# Patient Record
Sex: Female | Born: 1955 | Race: White | Hispanic: No | Marital: Married | Smoking: Former smoker
Health system: Southern US, Community
[De-identification: ages and names within clinical notes are randomized; demographics above are authoritative.]

## PROBLEM LIST (undated history)

## (undated) DIAGNOSIS — T7840XA Allergy, unspecified, initial encounter: Secondary | ICD-10-CM

## (undated) DIAGNOSIS — J45909 Unspecified asthma, uncomplicated: Secondary | ICD-10-CM

## (undated) DIAGNOSIS — Z9889 Other specified postprocedural states: Secondary | ICD-10-CM

## (undated) DIAGNOSIS — R112 Nausea with vomiting, unspecified: Secondary | ICD-10-CM

## (undated) DIAGNOSIS — K219 Gastro-esophageal reflux disease without esophagitis: Secondary | ICD-10-CM

## (undated) DIAGNOSIS — M1990 Unspecified osteoarthritis, unspecified site: Secondary | ICD-10-CM

## (undated) DIAGNOSIS — I10 Essential (primary) hypertension: Secondary | ICD-10-CM

## (undated) HISTORY — DX: Unspecified asthma, uncomplicated: J45.909

## (undated) HISTORY — DX: Allergy, unspecified, initial encounter: T78.40XA

---
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
## 1982-01-11 HISTORY — PX: TUBAL LIGATION: SHX77

## 1999-01-12 HISTORY — PX: ABDOMINAL HYSTERECTOMY: SHX81

## 2004-08-20 ENCOUNTER — Ambulatory Visit: Payer: Self-pay

## 2005-10-07 ENCOUNTER — Ambulatory Visit: Payer: Self-pay

## 2006-07-10 ENCOUNTER — Emergency Department: Payer: Self-pay | Admitting: Emergency Medicine

## 2007-03-30 ENCOUNTER — Ambulatory Visit: Payer: Self-pay | Admitting: Gastroenterology

## 2007-05-02 ENCOUNTER — Ambulatory Visit: Payer: Self-pay

## 2007-05-05 ENCOUNTER — Ambulatory Visit: Payer: Self-pay

## 2008-01-15 ENCOUNTER — Ambulatory Visit: Payer: Self-pay | Admitting: Family Medicine

## 2009-03-13 ENCOUNTER — Ambulatory Visit: Payer: Self-pay

## 2010-05-14 ENCOUNTER — Ambulatory Visit: Payer: Self-pay | Admitting: Family Medicine

## 2010-07-12 HISTORY — PX: KNEE SURGERY: SHX244

## 2010-07-31 ENCOUNTER — Ambulatory Visit: Payer: Self-pay | Admitting: Orthopedic Surgery

## 2010-08-06 ENCOUNTER — Ambulatory Visit: Payer: Self-pay | Admitting: Orthopedic Surgery

## 2012-09-07 ENCOUNTER — Ambulatory Visit: Payer: Self-pay | Admitting: Orthopedic Surgery

## 2012-09-25 ENCOUNTER — Ambulatory Visit: Payer: Self-pay | Admitting: Orthopedic Surgery

## 2012-09-25 LAB — CBC
HCT: 42.1 % (ref 35.0–47.0)
HGB: 14.3 g/dL (ref 12.0–16.0)
MCH: 31.3 pg (ref 26.0–34.0)
MCHC: 34 g/dL (ref 32.0–36.0)
RDW: 12.9 % (ref 11.5–14.5)

## 2012-09-25 LAB — APTT: Activated PTT: 24.9 secs (ref 23.6–35.9)

## 2012-09-25 LAB — URINALYSIS, COMPLETE
Bilirubin,UR: NEGATIVE
Blood: NEGATIVE
Glucose,UR: NEGATIVE mg/dL (ref 0–75)
Ketone: NEGATIVE
Leukocyte Esterase: NEGATIVE
Nitrite: NEGATIVE
Protein: NEGATIVE
RBC,UR: NONE SEEN /HPF (ref 0–5)
Squamous Epithelial: <1

## 2012-09-25 LAB — BASIC METABOLIC PANEL
Anion Gap: 4 — ABNORMAL LOW (ref 7–16)
Calcium, Total: 9.1 mg/dL (ref 8.5–10.1)
Creatinine: 0.68 mg/dL (ref 0.60–1.30)
EGFR (African American): >60
EGFR (Non-African Amer.): >60
Osmolality: 277 (ref 275–301)

## 2012-09-25 LAB — PROTIME-INR
INR: 1
Prothrombin Time: 13 secs (ref 11.5–14.7)

## 2012-09-25 LAB — SEDIMENTATION RATE: Erythrocyte Sed Rate: 7 mm/hr (ref 0–30)

## 2012-10-12 ENCOUNTER — Inpatient Hospital Stay: Payer: Self-pay | Admitting: Orthopedic Surgery

## 2012-10-12 HISTORY — PX: TOTAL KNEE ARTHROPLASTY: SHX125

## 2012-10-13 LAB — BASIC METABOLIC PANEL
Anion Gap: 11 (ref 7–16)
Calcium, Total: 8.2 mg/dL — ABNORMAL LOW (ref 8.5–10.1)
Chloride: 105 mmol/L (ref 98–107)
Creatinine: 0.64 mg/dL (ref 0.60–1.30)
EGFR (African American): >60
Glucose: 89 mg/dL (ref 65–99)
Osmolality: 279 (ref 275–301)
Potassium: 3.8 mmol/L (ref 3.5–5.1)

## 2012-10-13 LAB — HEMOGLOBIN: HGB: 12.7 g/dL (ref 12.0–16.0)

## 2012-10-13 LAB — PLATELET COUNT: Platelet: 243 10*3/uL (ref 150–440)

## 2012-10-16 LAB — PATHOLOGY REPORT

## 2013-02-06 ENCOUNTER — Ambulatory Visit: Payer: Self-pay | Admitting: Family Medicine

## 2013-11-20 ENCOUNTER — Ambulatory Visit: Payer: Self-pay | Admitting: Orthopedic Surgery

## 2013-11-20 LAB — BASIC METABOLIC PANEL
ANION GAP: 6 — AB (ref 7–16)
BUN: 13 mg/dL (ref 7–18)
CALCIUM: 8.3 mg/dL — AB (ref 8.5–10.1)
CHLORIDE: 107 mmol/L (ref 98–107)
CO2: 29 mmol/L (ref 21–32)
CREATININE: 0.68 mg/dL (ref 0.60–1.30)
EGFR (African American): >60
EGFR (Non-African Amer.): >60
Glucose: 76 mg/dL (ref 65–99)
Osmolality: 282 (ref 275–301)
POTASSIUM: 4.1 mmol/L (ref 3.5–5.1)
Sodium: 142 mmol/L (ref 136–145)

## 2013-11-20 LAB — URINALYSIS, COMPLETE
BILIRUBIN, UR: NEGATIVE
Bacteria: NONE SEEN
Blood: NEGATIVE
Glucose,UR: NEGATIVE mg/dL (ref 0–75)
Ketone: NEGATIVE
Nitrite: NEGATIVE
PROTEIN: NEGATIVE
Ph: 6 (ref 4.5–8.0)
SPECIFIC GRAVITY: 1.015 (ref 1.003–1.030)
Squamous Epithelial: 2
WBC UR: 2 /HPF (ref 0–5)

## 2013-11-20 LAB — APTT: ACTIVATED PTT: 23.9 s (ref 23.6–35.9)

## 2013-11-20 LAB — CBC
HCT: 39 % (ref 35.0–47.0)
HGB: 13.3 g/dL (ref 12.0–16.0)
MCH: 31.9 pg (ref 26.0–34.0)
MCHC: 34.1 g/dL (ref 32.0–36.0)
MCV: 93 fL (ref 80–100)
Platelet: 280 10*3/uL (ref 150–440)
RBC: 4.18 10*6/uL (ref 3.80–5.20)
RDW: 13.2 % (ref 11.5–14.5)
WBC: 6.9 10*3/uL (ref 3.6–11.0)

## 2013-11-20 LAB — PROTIME-INR
INR: 1
PROTHROMBIN TIME: 13.3 s (ref 11.5–14.7)

## 2013-11-20 LAB — SEDIMENTATION RATE: Erythrocyte Sed Rate: 9 mm/hr (ref 0–30)

## 2013-11-20 LAB — MRSA PCR SCREENING

## 2013-11-21 LAB — URINE CULTURE

## 2013-11-27 ENCOUNTER — Inpatient Hospital Stay: Payer: Self-pay | Admitting: Orthopedic Surgery

## 2013-11-28 LAB — BASIC METABOLIC PANEL
ANION GAP: 3 — AB (ref 7–16)
BUN: 9 mg/dL (ref 7–18)
CALCIUM: 8 mg/dL — AB (ref 8.5–10.1)
CO2: 30 mmol/L (ref 21–32)
CREATININE: 0.73 mg/dL (ref 0.60–1.30)
Chloride: 104 mmol/L (ref 98–107)
EGFR (African American): >60
EGFR (Non-African Amer.): >60
GLUCOSE: 112 mg/dL — AB (ref 65–99)
Osmolality: 273 (ref 275–301)
Potassium: 3.9 mmol/L (ref 3.5–5.1)
Sodium: 137 mmol/L (ref 136–145)

## 2013-11-28 LAB — PLATELET COUNT: Platelet: 235 10*3/uL (ref 150–440)

## 2013-11-28 LAB — HEMOGLOBIN: HGB: 12.4 g/dL (ref 12.0–16.0)

## 2014-05-03 NOTE — Discharge Summary (Signed)
PATIENT NAME:  Leslie Koch, Leslie Koch MR#:  [id number] DATE OF BIRTH:  September 17, 1955  DATE OF ADMISSION:  10/12/2012 DATE OF DISCHARGE:  10/15/2012  ADMITTING DIAGNOSIS: Degenerative arthritis, right knee.   DISCHARGE DIAGNOSIS: Degenerative arthritis, right knee.   OPERATION: On 10/12/2012, the patient had a right total knee arthroplasty.   SURGEON: Dr. Hessie Knows.   ANESTHESIA: Spinal.   ESTIMATED BLOOD LOSS: 100 mL.  TOURNIQUET TIME: 68 minutes at 300 mmHg.   DRAINS: None.   IMPLANTS: Medacta GMK sphere 3 femur, 2 tibia with 7 mm insert and 2 patella.   COMPLICATIONS: None.   HISTORY: Leslie Koch is a 59 year old female who failed conservative measures for right knee arthritis. This includes steroid injection, Synvisc injection. She has also had an arthroscopy of her right knee. She has also tried Celebrex. She elects to proceed with elective total knee replacement.   PHYSICAL EXAMINATION: HEENT: Upper full and lower partial dentures.  LUNGS: Clear.  HEART: Regular rate and rhythm. EXTREMITIES: Right knee range of motion 5 to 110. Sensation intact to her foot. Strong DP and PT pulses. Skin intact. Varus deformity is passively correctable. Some swelling in the posterior right knee.   HOSPITAL COURSE: After initial admission on 10/12/2012, she underwent right total knee arthroplasty. On postoperative day one, 10/13/2012, hemoglobin 12.7, platelets 243. She did have an issue with vomiting. Physical therapy was begun on that day and she already ambulated 100 feet. On postoperative day two, 10/14/2012, she continued to progress with therapy with good progress. She ambulated 230 feet with a rolling walker. She also had issues with pain control that day. On postoperative day three, 10/15/2012, she continued to progress with therapy. She was stable and ready to be discharged home, as her pain was under control.  CONDITION AT DISCHARGE: Stable.   DISPOSITION: The patient was sent home with home  health.   DISCHARGE INSTRUCTIONS:  1.  The patient will follow up with [facility] orthopedics in 2 weeks for staple removal.   2.  She will have home health physical therapy and weight bear as tolerated on the right lower extremity.  3.  She will have a regular diet.  4.  TED hose thigh high bilaterally.  5.  Dressing can be changed once daily and on an as needed basis.   DISCHARGE MEDICATIONS: Please see discharge instructions for full list of discharge medications. ____________________________ Leslie Koch M. Leslie Sciara, NP amb:sb D: 10/16/2012 10:32:54 ET T: 10/16/2012 10:48:54 ET JOB#: [id number]  cc: Leslie Koch M. Leslie Sciara, NP, <Dictator> Leslie Kays Jamae Tison FNP ELECTRONICALLY SIGNED 10/17/2012 8:56

## 2014-05-03 NOTE — Op Note (Signed)
PATIENT NAME:  Leslie Koch, Leslie Koch MR#:  [id number] DATE OF BIRTH:  1955-09-08  DATE OF PROCEDURE:  10/12/2012  PREOPERATIVE DIAGNOSIS: Severe right knee osteoarthritis.   POSTOPERATIVE DIAGNOSIS:  Severe right knee osteoarthritis.   PROCEDURE: Right total knee replacement.   ANESTHESIA: Spinal.   SURGEON: Hessie Knows, MD  DESCRIPTION OF PROCEDURE: The patient was brought to the operating room and after adequate anesthesia was obtained, right leg was prepped and draped in the usual sterile fashion with a tourniquet applied to the upper thigh. After prepping and draping in the usual sterile manner, appropriate patient identification and timeout procedure were completed. The leg was exsanguinated with an Esmarch and the tourniquet raised to 300 mmHg. A midline skin incision was made followed by a medial parapatellar arthrotomy. Inspection revealed severe patellofemoral degenerative change with exposed bone on the patella. The medial compartment had eburnated bone over most of the distal aspect of the femur. Lateral compartment had pitting but moderate wear compared to the medial side. The anterior cruciate ligament and fat pad were excised and the proximal tibia was exposed for application of the Medacta tibial cutting block. After checking alignment,  this proximal tibia cut was carried out and the proximal tibia removed along with the posterior cruciate ligament. Next, the femur was addressed with removing cartilage to get the block at the appropriate level. Distal femoral cut carried out followed by cutting block for the size 3. Anterior, posterior and chamfer cuts were made with no notching. Trial components were placed at this time and it was a 17 mm insert gave good stability throughout a range of motion and was subsequently chosen for the final component. Distal femoral drill holes were made in the notch cut was made for the trochlear groove. These trials were then removed and the patellar cut with  patellar cutting guide, drilled and it sized to a size 2. At this point, the tourniquet let down to make sure there was no excessive bleeding or arterial bleeding; there was none present. The joint was then infiltrated with a combination of Exparel  for postoperative analgesia along with a combination of morphine, Toradol and local anesthetic to aid in immediate postoperative analgesia. The tourniquet was raised again and the bony surfaces thoroughly irrigated and dried. The tibial component was cemented into place first with excess cement removed, followed by the tibial insert with set screw to lock it in place. The femoral component was then impacted into place and again excess cement removed. The knee was held in extension as the patellar button was clamped into place. After all cement had set, the excess cement was removed and the knee again thoroughly irrigated. The tourniquet was let down and the arthrotomy closed with a heavy quill suture with excellent patellofemoral tracking, 2-0 quill subcutaneously and skin staples. Xeroform, 4 x 4's, ABD, Webril, Polar Care and Ace wrap were then applied. The patient was sent to the recovery room in stable condition.   ESTIMATED BLOOD LOSS: 914 mL   COMPLICATIONS: None.   SPECIMEN: Cut ends of bone.   TOURNIQUET TIME: 68 minutes at 300 mmHg.   IMPLANTS: Medacta GMK sphere 3 femur, 2 tibia, 17 mm flex insert, and a size 2 patella.     ____________________________ Laurene Footman, MD mjm:cc D: 10/12/2012 17:54:04 ET T: 10/12/2012 21:16:28 ET JOB#: [id number]  cc: Laurene Footman, MD, <Dictator> Laurene Footman MD ELECTRONICALLY SIGNED 10/12/2012 22:38

## 2014-05-04 NOTE — Op Note (Signed)
PATIENT NAME:  Leslie Koch, Leslie Koch MR#:  [id number] DATE OF BIRTH:  01-07-1956  DATE OF PROCEDURE:  11/27/2013  PREOPERATIVE DIAGNOSIS: Loose right tibial component, total knee.   POSTOPERATIVE DIAGNOSIS:  Loose right tibial component, total knee.  PROCEDURE: Right total knee revision, revision of tibial components.   ANESTHESIA: Spinal.   SURGEON: Hessie Knows, M.D.   ASSISTANT:  Rachelle Hora, [name].   DESCRIPTION OF PROCEDURE: The patient was brought to the operating room and after adequate anesthesia was obtained, the right leg was prepped and draped in the usual sterile fashion. After patient identification and timeout procedures were completed, the prior incision was opened using a direct anterior incision followed by a medial parapatellar arthrotomy. Inspection revealed moderate synovitis. Obvious subsidence of the medial side of the tibial component.   After that initial exposure was obtained and a partial synovectomy created to allow for adequate exposure, the prior polyethylene insert was removed and the tibia was brought forward. There was no difficulty separating the tibia from the metal insert from the cement as it had separated. The tibia was removed without a great deal of difficulty. Cement was then removed from the top of the tibia, exposing the canal. Sequential hand-reaming was carried out until there was appropriate sizing. A 12 hand reamer, gave good fill of the canal and the Medacta revision cutting guide was applied with resection carried out to remove the prior cement and minimal bone resection. Using the size 2 as a trial, this was pinned in place and a 5 mm offset was determined to be required.   Next, the proximal reaming was carried out followed by the keel punch and then assembly of the trial with 10 mm augment medially and laterally. With these augments, it appeared there was good stability with a 13 or 14 mm insert. The trial components were removed and the bony surfaces  thoroughly irrigated and dried.  Tourniquet was let down and hemostasis achieved with electrocautery excising significant synovitis in the suprapatellar pouch along with the gutters. The knee was then infiltrated with Exparel diluted with saline and the tourniquet raised again. Again, the bony surfaces thoroughly irrigated and dried and the tibia brought forward. Cement was mixed and after having set the tibial component with the appropriate 5 mm offset and appropriate degrees, this was inserted down the canal and impacted into place and a trial 13 mm insert placed as the knee was held in extension.  After the cement had set, and a 14 mm trial fit better. The initial 13 mm insert did not lock posteriorly. The 14 mm insert gave good stability through range of motion, there was range of motion from 0 to approximately 115 degrees with good stability. At 115 degrees, there appeared to be a little bit of liftoff anteriorly. Patella tracked well.   The arthrotomy was repaired using a heavy Ethibond suture around the patella to reinforce followed by heavy quill, 2-0 quill subcutaneously, skin staples, Xeroform, 4 x 4's, ABD, Webril and Ace wrap along with a Polar Care device, 1 Hemovac drain was inserted to try to prevent postop hematoma. The patient was sent to the recovery room in stable condition.   ESTIMATED BLOOD LOSS: 300 mL.   COMPLICATIONS: None.    SPECIMEN: None.   TOURNIQUET TIME: 41 minutes initially, an additional 54 minutes later. Total tourniquet time then 95 minutes.   IMPLANTS: Medacta GMK revision stem tibial augment 10 mm size two x2, a 2 right 14 mm flex polyethylene insert,  a 5 mm offset with a 12 x 105 mm stem and a 2 right fixed tibial tray from the [place] revision set.    ____________________________ Laurene Footman, MD mjm:kl D: 11/27/2013 10:46:00 ET T: 11/27/2013 13:31:12 ET JOB#: [id number]  cc: Laurene Footman, MD, <Dictator> Laurene Footman MD ELECTRONICALLY SIGNED 11/27/2013  19:04

## 2014-05-04 NOTE — Discharge Summary (Signed)
PATIENT NAME:  Leslie Koch, BISCH MR#:  [id number] DATE OF BIRTH:  27-Apr-1955  DATE OF ADMISSION:  11/27/2013  DATE OF DISCHARGE: 11/30/2013   ADMITTING DIAGNOSIS: Loose right tibial component in the total knee.   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   OPERATION: On 11/27/2013, the patient had a right total knee revision with revision of tibial components.   ANESTHESIA: Spinal.   SURGEON: Laurene Footman, MD.  ASSISTANT: Ronney Asters, [name].  ESTIMATED BLOOD LOSS: 300 mL.  IMPLANTS USED:  Medacta GMK revision stemmed tibial augment 10 mm size 2 x 2, a 2 right 14 mm flex polyethylene insert, a 5 mm offset with 12 x 105 mm stem and a 2 right fixed tibial tray from the [place] revision set.    The patient was stabilized, brought to the recovery room and then brought down to the orthopedic floor.   HISTORY AND PHYSICAL HISTORY: The patient is a 59 year old female who presents for persistent pain involving her right total knee replacement. The patient had the original procedure done in November of 2014. The patient continues to have difficulty with activities of daily living and with physical therapy showing no improvement.   PHYSICAL EXAMINATION: GENERAL: Alert female with some discomfort with transfers and ambulation.  CARDIAC: Regular rate and rhythm.  LUNGS: Clear to auscultation.  MUSCULOSKELETAL: In regard to the right lower extremity, the patient has an antalgic gait with mild effusion. The patient is tender along the medial joint line and proximal tibia. The patient has normal strength with good stability.   HOSPITAL COURSE: After initial admission on 11/27/2013, the patient was brought to the orthopedic floor. On postoperative day one, the patient's hemoglobin was 12.4 and remained stable there. The patient worked with physical therapy, initially bed to chair and progressed up to ambulating around the nurse's station including doing stairs. The patient  was ready to go home with home health physical therapy on 11/30/2013.   CONDITION AT DISCHARGE: Stable.   DISPOSITION: The patient was sent home with home health physical therapy.   DISCHARGE INSTRUCTIONS: The patient will do weight bear as tolerated on the affected leg. The patient will elevate her foot with 1 to 2 pillows. The patient will use thigh-high TED hose removed in the morning and kept on at night. The patient will elevate her heels off the bed and use incentive spirometer. The patient's diet is regular. The patient will use Polar Care to decrease swelling and try not and get her dressing dirty or wet. The patient will call the clinic if there is any bright red bleeding, calf pain, or bowel or bladder difficulty. The patient will do home health physical therapy working on gait training and range of motion activities.   DISCHARGE MEDICATIONS: Resume home medications and then to add oxycodone 15  mg 1 tablet b.i.d. for 14 days and then oxycodone 5 mg 1 tablet every 4 hours as needed for severe pain and Lovenox 40 mg subcutaneous once a day for 14 days and then discontinue and start on aspirin 81 mg once a day.    ____________________________ Lenna Sciara. Reche Dixon, [name] jtm:DT D: 11/30/2013 05:51:00 ET T: 11/30/2013 13:03:18 ET JOB#: [id number]  cc: J. Reche Dixon, [name], <Dictator> J Darris Sta[facility] PA ELECTRONICALLY SIGNED 12/02/2013 6:17

## 2014-08-05 ENCOUNTER — Other Ambulatory Visit: Payer: Self-pay | Admitting: Family Medicine

## 2014-08-05 MED ORDER — ESTROGENS CONJUGATED 0.45 MG PO TABS: 0.4500 mg | ORAL_TABLET | Freq: Every day | ORAL | Status: DC

## 2014-08-06 ENCOUNTER — Other Ambulatory Visit: Payer: Self-pay

## 2014-08-06 DIAGNOSIS — E8940 Asymptomatic postprocedural ovarian failure: Secondary | ICD-10-CM | POA: Insufficient documentation

## 2014-08-06 DIAGNOSIS — K219 Gastro-esophageal reflux disease without esophagitis: Secondary | ICD-10-CM | POA: Insufficient documentation

## 2014-08-06 DIAGNOSIS — Z90710 Acquired absence of both cervix and uterus: Secondary | ICD-10-CM | POA: Insufficient documentation

## 2014-08-06 DIAGNOSIS — F329 Major depressive disorder, single episode, unspecified: Secondary | ICD-10-CM | POA: Insufficient documentation

## 2014-08-06 DIAGNOSIS — I10 Essential (primary) hypertension: Secondary | ICD-10-CM | POA: Insufficient documentation

## 2014-08-06 DIAGNOSIS — F419 Anxiety disorder, unspecified: Secondary | ICD-10-CM | POA: Insufficient documentation

## 2014-08-06 DIAGNOSIS — F32A Depression, unspecified: Secondary | ICD-10-CM | POA: Insufficient documentation

## 2014-08-06 DIAGNOSIS — E559 Vitamin D deficiency, unspecified: Secondary | ICD-10-CM | POA: Insufficient documentation

## 2014-08-06 DIAGNOSIS — E7800 Pure hypercholesterolemia, unspecified: Secondary | ICD-10-CM | POA: Insufficient documentation

## 2014-08-06 DIAGNOSIS — G4700 Insomnia, unspecified: Secondary | ICD-10-CM | POA: Insufficient documentation

## 2014-08-09 ENCOUNTER — Encounter: Payer: Self-pay | Admitting: Physician Assistant

## 2014-08-09 ENCOUNTER — Ambulatory Visit (INDEPENDENT_AMBULATORY_CARE_PROVIDER_SITE_OTHER): Payer: BLUE CROSS/BLUE SHIELD | Admitting: Physician Assistant

## 2014-08-09 VITALS — BP 112/72 | HR 80 | Temp 98.3°F | Resp 16 | Ht 62.0 in | Wt 214.0 lb

## 2014-08-09 DIAGNOSIS — IMO0002 Reserved for concepts with insufficient information to code with codable children: Secondary | ICD-10-CM | POA: Insufficient documentation

## 2014-08-09 DIAGNOSIS — N8110 Cystocele, unspecified: Secondary | ICD-10-CM

## 2014-08-09 DIAGNOSIS — Z1239 Encounter for other screening for malignant neoplasm of breast: Secondary | ICD-10-CM

## 2014-08-09 DIAGNOSIS — Z0000 Encounter for general adult medical examination without abnormal findings: Secondary | ICD-10-CM

## 2014-08-09 DIAGNOSIS — N951 Menopausal and female climacteric states: Secondary | ICD-10-CM | POA: Diagnosis not present

## 2014-08-09 DIAGNOSIS — N816 Rectocele: Secondary | ICD-10-CM | POA: Diagnosis not present

## 2014-08-09 DIAGNOSIS — Z124 Encounter for screening for malignant neoplasm of cervix: Secondary | ICD-10-CM

## 2014-08-09 MED ORDER — VENLAFAXINE HCL ER 75 MG PO CP24: 75.0000 mg | ORAL_CAPSULE | Freq: Every day | ORAL | Status: DC

## 2014-08-09 NOTE — Progress Notes (Signed)
Patient ID: Leslie Koch, female   DOB: May 25, 1955, 59 y.o.   MRN: [id number]       Patient: Leslie Koch, Female    DOB: 1955/08/11, 59 y.o.   MRN: [id number] Visit Date: 08/09/2014  Today's Provider: Mar Daring, [name]   Chief Complaint  Patient presents with  . Annual Exam  . Hot Flashes   Subjective:    Annual wellness visit Leslie Koch is a 59 y.o. female who presents today for her Subsequent Annual Wellness Visit. She feels well. She reports exercising occasionally . She reports she is sleeping well.  Her last Pap smear was in March 2011 and was normal and negative HPV. She has never had an abnormal Pap smear. She is status post partial hysterectomy for prolapsed uterus.  She denies any family history of cervical or ovarian cancer. Her last mammogram was in January 2015. It is reported normal. She has never had an abnormal mammogram. There is positive family history of breast cancer in her paternal grandmother.  Her last colonoscopy was March 2009. She is to have repeat colonoscopy in 10 years which would be 2019. There is positive family history of colon cancer in her maternal great aunt.  Her only complaint today is of increasing hot flashes. The hot flashes are increasing in frequency, duration, and intensity. She is interested in other treatment.  -----------------------------------------------------------   Review of Systems  Constitutional: Negative.   HENT: Negative.   Eyes: Negative.   Respiratory: Negative.   Cardiovascular: Negative.   Gastrointestinal: Negative.   Endocrine: Positive for heat intolerance.  Genitourinary: Negative.   Musculoskeletal: Negative.   Skin: Negative.   Allergic/Immunologic: Negative.   Neurological: Negative.   Hematological: Negative.   Psychiatric/Behavioral: Negative.     History   Social History  . Marital Status: Married    Spouse Name: N/A  . Number of Children: N/A  . Years of Education: N/A   Occupational  History  . Not on file.   Social History Main Topics  . Smoking status: Former Smoker -- 2.00 packs/day for 30 years    Types: Cigarettes  . Smokeless tobacco: Never Used     Comment: quit in 1990's  . Alcohol Use: 0.0 oz/week    0 Standard drinks or equivalent per week     Comment: occasionally 2-3 drinks once or twice a month  . Drug Use: No  . Sexual Activity: Not on file   Other Topics Concern  . Not on file   Social History Narrative    Patient Active Problem List   Diagnosis Date Noted  . Anxiety 08/06/2014  . Clinical depression 08/06/2014  . Acid reflux 08/06/2014  . Hypercholesteremia 08/06/2014  . Benign essential HTN 08/06/2014  . Cannot sleep 08/06/2014  . Post hysterectomy menopause 08/06/2014  . Avitaminosis D 08/06/2014    Past Surgical History  Procedure Laterality Date  . Tubal ligation  1984  . Abdominal hysterectomy  01/1999  . Total knee arthroplasty Right 10/12/2012  . Cyst excision  1974  . Knee surgery Right 07/2010    Her family history includes Breast cancer in her paternal grandmother; Coronary artery disease in her maternal grandfather and mother; Diabetes in her mother; Hypertension in her mother; Lung cancer in her father; Stroke in her maternal grandmother.    Previous Medications   ASPIRIN 81 MG TABLET    Take 1 tablet by mouth daily.   BUPROPION (WELLBUTRIN XL) 150 MG 24 HR TABLET  Take 1 tablet by mouth daily.   CALCIUM CARBONATE-VITAMIN D (OYSTER SHELL CALCIUM/D PO)    Take 1 tablet by mouth 2 (two) times daily.   CELECOXIB (CELEBREX) 200 MG CAPSULE    Take 1 capsule by mouth daily.   CETIRIZINE (ZYRTEC ALLERGY) 10 MG TABLET    Take 1 tablet by mouth daily.   CHOLECALCIFEROL 1000 UNITS CAPSULE    Take 1 capsule by mouth 2 (two) times daily.   ESTROGENS, CONJUGATED, (PREMARIN) 0.45 MG TABLET    Take 1 tablet (0.45 mg total) by mouth daily. Take daily for 21 days then do not take for 7 days.   GLUCOSAMINE-CHONDROIT-VIT C-MN  (GLUCOSAMINE CHONDR 1500 COMPLX) CAPS    Take 1 capsule by mouth 2 (two) times daily.   LISINOPRIL-HYDROCHLOROTHIAZIDE (PRINZIDE,ZESTORETIC) 20-12.5 MG PER TABLET    Take 1 tablet by mouth daily.   MULTIPLE VITAMIN PO    Take 1 tablet by mouth daily.   OMEGA-3 FATTY ACIDS PO    Take 2 capsules by mouth 2 (two) times daily.   OMEPRAZOLE 20 MG TBEC    Take 1 tablet by mouth 2 (two) times daily.   SERTRALINE (ZOLOFT) 50 MG TABLET    Take 1 tablet by mouth daily.   SIMVASTATIN (ZOCOR) 40 MG TABLET    Take 1 tablet by mouth at bedtime.   TRAMADOL (ULTRAM) 50 MG TABLET    Take 1 tablet by mouth daily as needed.   VALACYCLOVIR (VALTREX) 1000 MG TABLET    Take 1-2 tablets by mouth every 12 (twelve) hours as needed.    Patient Care Team: Mar Daring, [name] as PCP - General (Physician Assistant)     Objective:   Vitals: BP 112/72 mmHg  Pulse 80  Temp(Src) 98.3 F (36.8 C)  Resp 16  Ht 5\' 2"  (1.575 m)  Wt 214 lb (97.07 kg)  BMI 39.13 kg/m2  Physical Exam  Constitutional: She is oriented to person, place, and time. She appears well-developed and well-nourished. No distress.  HENT:  Head: Normocephalic and atraumatic.  Right Ear: Hearing, tympanic membrane, external ear and ear canal normal.  Left Ear: Hearing, tympanic membrane, external ear and ear canal normal.  Nose: Nose normal.  Mouth/Throat: Uvula is midline, oropharynx is clear and moist and mucous membranes are normal. No oropharyngeal exudate.  Eyes: Conjunctivae and EOM are normal. Pupils are equal, round, and reactive to light. Right eye exhibits no discharge. Left eye exhibits no discharge. No scleral icterus.  Neck: Normal range of motion. Neck supple. No JVD present. Carotid bruit is not present. No tracheal deviation present. No thyromegaly present.  Cardiovascular: Normal rate, regular rhythm, normal heart sounds and intact distal pulses.  Exam reveals no gallop and no friction rub.   No murmur heard. Pulmonary/Chest:  Effort normal and breath sounds normal. No respiratory distress. She has no wheezes. She has no rales. She exhibits no tenderness. Right breast exhibits no inverted nipple, no mass, no nipple discharge, no skin change and no tenderness. Left breast exhibits no inverted nipple, no mass, no nipple discharge, no skin change and no tenderness. Breasts are symmetrical.  Abdominal: Soft. Bowel sounds are normal. She exhibits no distension and no mass. There is no tenderness. There is no rebound and no guarding. Hernia confirmed negative in the right inguinal area and confirmed negative in the left inguinal area.  Genitourinary: Rectum normal and vagina normal. No breast swelling, tenderness, discharge or bleeding. Pelvic exam was performed with patient supine. There  is no rash, tenderness, lesion or injury on the right labia. There is no rash, tenderness, lesion or injury on the left labia. Right adnexum displays no mass, no tenderness and no fullness. Left adnexum displays no mass, no tenderness and no fullness. No erythema, tenderness or bleeding in the vagina. No signs of injury around the vagina. No vaginal discharge found.  Cervix was not visualized secondary to cystocele and rectocele.  She is s/p partial hysterectomy with cervix and ovaries remaining.  Musculoskeletal: Normal range of motion. She exhibits no edema or tenderness.  Lymphadenopathy:    She has no cervical adenopathy.       Right: No inguinal adenopathy present.       Left: No inguinal adenopathy present.  Neurological: She is alert and oriented to person, place, and time. She has normal reflexes. No cranial nerve deficit. Coordination normal.  Skin: Skin is warm and dry. No rash noted. She is not diaphoretic.  Psychiatric: She has a normal mood and affect. Her behavior is normal. Judgment and thought content normal.  Vitals reviewed.      Assessment & Plan:     Annual Wellness Visit  Reviewed patient's Family Medical  History Reviewed and updated list of patient's medical providers Assessed patient's functional ability Established a written schedule for health screening [place] Completed and Reviewed  Exercise Activities and Dietary recommendations Goals    None      Immunization History  Administered Date(s) Administered  . Tdap 02/24/2007    There are no preventive care reminders to display for this patient.    Discussed health benefits of physical activity, and encouraged her to engage in regular exercise appropriate for her age and condition.   1. Annual physical exam We'll check labs, follow-up pending labs. - CBC with Differential - Comprehensive metabolic panel - Lipid panel - TSH  2. Cervical cancer screening Unable to obtain Pap smear today due to not being able to visualize the cervix well. Upon exam she did have a cystocele and rectocele that made it hard to visualize.  3. Breast cancer screening Breast exam normal today. - Mammogram Digital Screening; Future  4. Hot flashes, menopausal Currently on Premarin 0.45mg  tablet. She has noticed increased intensity of her hot flashes over the past 3-4 months. I will check her TSH. Will add venlafaxine for better control of hot flashes. She is to return in one month for follow-up. - CBC with Differential - Comprehensive metabolic panel - TSH - venlafaxine XR (EFFEXOR-XR) 75 MG 24 hr capsule; Take 1 capsule (75 mg total) by mouth daily with breakfast.  Dispense: 30 capsule; Refill: 1  5. Cystocele Seen on physical exam today. Made it difficult to visualize the cervix for Pap smear. She does report having some stress incontinence and urgency. She does not wish to undergo any further her evaluation or treatment at this time unless her symptoms worsen.  6. Rectocele Seen on physical exam today. Made it difficult to visualize the cervix for Pap smear. She does not report any constipation or bowel changes. She  wishes to not undergo any further evaluation or treatment unless her symptoms worsen.    ------------------------------------------------------------------------------------------------------------

## 2014-08-09 NOTE — Patient Instructions (Signed)
Health Maintenance Adopting a healthy lifestyle and getting preventive care can go a long way to promote health and wellness. Talk with your health care provider about what schedule of regular examinations is right for you. This is a good chance for you to check in with your provider about disease prevention and staying healthy. In between checkups, there are plenty of things you can do on your own. Experts have done a lot of research about which lifestyle changes and preventive measures are most likely to keep you healthy. Ask your health care provider for more information. WEIGHT AND DIET  Eat a healthy diet 1. Be sure to include plenty of vegetables, fruits, low-fat dairy products, and lean protein. 2. Do not eat a lot of foods high in solid fats, added sugars, or salt. 3. Get regular exercise. This is one of the most important things you can do for your health. 1. Most adults should exercise for at least 150 minutes each week. The exercise should increase your heart rate and make you sweat (moderate-intensity exercise). 2. Most adults should also do strengthening exercises at least twice a week. This is in addition to the moderate-intensity exercise.  Maintain a healthy weight 1. Body mass index (BMI) is a measurement that can be used to identify possible weight problems. It estimates body fat based on height and weight. Your health care provider can help determine your BMI and help you achieve or maintain a healthy weight. 2. For females 6 years of age and older:  1. A BMI below 18.5 is considered underweight. 2. A BMI of 18.5 to 24.9 is normal. 3. A BMI of 25 to 29.9 is considered overweight. 4. A BMI of 30 and above is considered obese.  Watch levels of cholesterol and blood lipids 1. You should start having your blood tested for lipids and cholesterol at 59 years of age, then have this test every 5 years. 2. You may need to have your cholesterol levels checked more often if: 1. Your  lipid or cholesterol levels are high. 2. You are older than 58 years of age. 3. You are at high risk for heart disease.  CANCER SCREENING   Lung Cancer 1. Lung cancer screening is recommended for adults 7-87 years old who are at high risk for lung cancer because of a history of smoking. 2. A yearly low-dose CT scan of the lungs is recommended for people who: 1. Currently smoke. 2. Have quit within the past 15 years. 3. Have at least a 30-pack-year history of smoking. A pack year is smoking an average of one pack of cigarettes a day for 1 year. 3. Yearly screening should continue until it has been 15 years since you quit. 4. Yearly screening should stop if you develop a health problem that would prevent you from having lung cancer treatment.  Breast Cancer  Practice breast self-awareness. This means understanding how your breasts normally appear and feel.  It also means doing regular breast self-exams. Let your health care provider know about any changes, no matter how small.  If you are in your 20s or 30s, you should have a clinical breast exam (CBE) by a health care provider every 1-3 years as part of a regular health exam.  If you are 30 or older, have a CBE every year. Also consider having a breast X-Madlock (mammogram) every year.  If you have a family history of breast cancer, talk to your health care provider about genetic screening.  If you are  at high risk for breast cancer, talk to your health care provider about having an MRI and a mammogram every year.  Breast cancer gene (BRCA) assessment is recommended for women who have family members with BRCA-related cancers. BRCA-related cancers include:  Breast.  Ovarian.  Tubal.  Peritoneal cancers.  Results of the assessment will determine the need for genetic counseling and BRCA1 and BRCA2 testing. Cervical Cancer Routine pelvic examinations to screen for cervical cancer are no longer recommended for nonpregnant women who  are considered low risk for cancer of the pelvic organs (ovaries, uterus, and vagina) and who do not have symptoms. A pelvic examination may be necessary if you have symptoms including those associated with pelvic infections. Ask your health care provider if a screening pelvic exam is right for you.   The Pap test is the screening test for cervical cancer for women who are considered at risk.  If you had a hysterectomy for a problem that was not cancer or a condition that could lead to cancer, then you no longer need Pap tests.  If you are older than 65 years, and you have had normal Pap tests for the past 10 years, you no longer need to have Pap tests.  If you have had past treatment for cervical cancer or a condition that could lead to cancer, you need Pap tests and screening for cancer for at least 20 years after your treatment.  If you no longer get a Pap test, assess your risk factors if they change (such as having a new sexual partner). This can affect whether you should start being screened again.  Some women have medical problems that increase their chance of getting cervical cancer. If this is the case for you, your health care provider may recommend more frequent screening and Pap tests.  The human papillomavirus (HPV) test is another test that may be used for cervical cancer screening. The HPV test looks for the virus that can cause cell changes in the cervix. The cells collected during the Pap test can be tested for HPV.  The HPV test can be used to screen women 2 years of age and older. Getting tested for HPV can extend the interval between normal Pap tests from three to five years.  An HPV test also should be used to screen women of any age who have unclear Pap test results.  After 58 years of age, women should have HPV testing as often as Pap tests.  Colorectal Cancer  This type of cancer can be detected and often prevented.  Routine colorectal cancer screening usually  begins at 58 years of age and continues through 58 years of age.  Your health care provider may recommend screening at an earlier age if you have risk factors for colon cancer.  Your health care provider may also recommend using home test kits to check for hidden blood in the stool.  A small camera at the end of a tube can be used to examine your colon directly (sigmoidoscopy or colonoscopy). This is done to check for the earliest forms of colorectal cancer.  Routine screening usually begins at age 57.  Direct examination of the colon should be repeated every 5-10 years through 58 years of age. However, you may need to be screened more often if early forms of precancerous polyps or small growths are found. Skin Cancer  Check your skin from head to toe regularly.  Tell your health care provider about any new moles or changes in  moles, especially if there is a change in a mole's shape or color.  Also tell your health care provider if you have a mole that is larger than the size of a pencil eraser.  Always use sunscreen. Apply sunscreen liberally and repeatedly throughout the day.  Protect yourself by wearing long sleeves, pants, a wide-brimmed hat, and sunglasses whenever you are outside. HEART DISEASE, DIABETES, AND HIGH BLOOD PRESSURE   Have your blood pressure checked at least every 1-2 years. High blood pressure causes heart disease and increases the risk of stroke.  If you are between 32 years and 30 years old, ask your health care provider if you should take aspirin to prevent strokes.  Have regular diabetes screenings. This involves taking a blood sample to check your fasting blood sugar level.  If you are at a normal weight and have a low risk for diabetes, have this test once every three years after 58 years of age.  If you are overweight and have a high risk for diabetes, consider being tested at a younger age or more often. PREVENTING INFECTION  Hepatitis B  If you have a  higher risk for hepatitis B, you should be screened for this virus. You are considered at high risk for hepatitis B if:  You were born in a country where hepatitis B is common. Ask your health care provider which countries are considered high risk.  Your parents were born in a high-risk country, and you have not been immunized against hepatitis B (hepatitis B vaccine).  You have HIV or AIDS.  You use needles to inject street drugs.  You live with someone who has hepatitis B.  You have had sex with someone who has hepatitis B.  You get hemodialysis treatment.  You take certain medicines for conditions, including cancer, organ transplantation, and autoimmune conditions. Hepatitis C  Blood testing is recommended for:  Everyone born from [birth year] through 1964.  Anyone with known risk factors for hepatitis C. Sexually transmitted infections (STIs)  You should be screened for sexually transmitted infections (STIs) including gonorrhea and chlamydia if:  You are sexually active and are younger than 58 years of age.  You are older than 58 years of age and your health care provider tells you that you are at risk for this type of infection.  Your sexual activity has changed since you were last screened and you are at an increased risk for chlamydia or gonorrhea. Ask your health care provider if you are at risk.  If you do not have HIV, but are at risk, it may be recommended that you take a prescription medicine daily to prevent HIV infection. This is called pre-exposure prophylaxis (PrEP). You are considered at risk if:  You are sexually active and do not regularly use condoms or know the HIV status of your partner(s).  You take drugs by injection.  You are sexually active with a partner who has HIV. Talk with your health care provider about whether you are at high risk of being infected with HIV. If you choose to begin PrEP, you should first be tested for HIV. You should then be tested  every 3 months for as long as you are taking PrEP.  PREGNANCY   If you are premenopausal and you may become pregnant, ask your health care provider about preconception counseling.  If you may become pregnant, take 400 to 800 micrograms (mcg) of folic acid every day.  If you want to prevent pregnancy, talk to your  health care provider about birth control (contraception). OSTEOPOROSIS AND MENOPAUSE   Osteoporosis is a disease in which the bones lose minerals and strength with aging. This can result in serious bone fractures. Your risk for osteoporosis can be identified using a bone density scan.  If you are 34 years of age or older, or if you are at risk for osteoporosis and fractures, ask your health care provider if you should be screened.  Ask your health care provider whether you should take a calcium or vitamin D supplement to lower your risk for osteoporosis.  Menopause may have certain physical symptoms and risks.  Hormone replacement therapy may reduce some of these symptoms and risks. Talk to your health care provider about whether hormone replacement therapy is right for you.  HOME CARE INSTRUCTIONS   Schedule regular health, dental, and eye exams.  Stay current with your immunizations.   Do not use any tobacco products including cigarettes, chewing tobacco, or electronic cigarettes.  If you are pregnant, do not drink alcohol.  If you are breastfeeding, limit how much and how often you drink alcohol.  Limit alcohol intake to no more than 1 drink per day for nonpregnant women. One drink equals 12 ounces of beer, 5 ounces of wine, or 1 ounces of hard liquor.  Do not use street drugs.  Do not share needles.  Ask your health care provider for help if you need support or information about quitting drugs.  Tell your health care provider if you often feel depressed.  Tell your health care provider if you have ever been abused or do not feel safe at home. Document  Released: 07/13/2010 Document Revised: 05/14/2013 Document Reviewed: 11/29/2012 [place] [facility] Patient Information 2015 [place], [place]. This information is not intended to replace advice given to you by your health care provider. Make sure you discuss any questions you have with your health care provider.     Why follow it? Research shows. . Those who follow the Mediterranean diet have a reduced risk of heart disease  . The diet is associated with a reduced incidence of Parkinson's and Alzheimer's diseases . People following the diet may have longer life expectancies and lower rates of chronic diseases  . The Dietary Guidelines for Americans recommends the Mediterranean diet as an eating plan to promote health and prevent disease  What Is the Mediterranean Diet?  . Healthy eating plan based on typical foods and recipes of Mediterranean-style cooking . The diet is primarily a plant based diet; these foods should make up a majority of meals   Starches - Plant based foods should make up a majority of meals - They are an important sources of vitamins, minerals, energy, antioxidants, and fiber - Choose whole grains, foods high in fiber and minimally processed items  - Typical grain sources include wheat, oats, barley, corn, brown rice, bulgar, farro, millet, polenta, couscous  - Various types of beans include chickpeas, lentils, fava beans, black beans, white beans   Fruits  Veggies - Large quantities of antioxidant rich fruits & veggies; 6 or more servings  - Vegetables can be eaten raw or lightly drizzled with oil and cooked  - Vegetables common to the traditional Mediterranean Diet include: artichokes, arugula, beets, broccoli, brussel sprouts, cabbage, carrots, celery, collard greens, cucumbers, eggplant, kale, leeks, lemons, lettuce, mushrooms, okra, onions, peas, peppers, potatoes, pumpkin, radishes, rutabaga, shallots, spinach, sweet potatoes, turnips, zucchini - Fruits common to the Mediterranean  Diet include: apples, apricots, avocados, cherries, clementines, dates, figs, grapefruits,  grapes, melons, nectarines, oranges, peaches, pears, pomegranates, strawberries, tangerines  Fats - Replace butter and margarine with healthy oils, such as olive oil, canola oil, and tahini  - Limit nuts to no more than a handful a day  - Nuts include walnuts, almonds, pecans, pistachios, pine nuts  - Limit or avoid candied, honey roasted or heavily salted nuts - Olives are central to the Mediterranean diet - can be eaten whole or used in a variety of dishes   Meats Protein - Limiting red meat: no more than a few times a month - When eating red meat: choose lean cuts and keep the portion to the size of deck of cards - Eggs: approx. 0 to 4 times a week  - Fish and lean poultry: at least 2 a week  - Healthy protein sources include, chicken, [place], lean beef, lamb - Increase intake of seafood such as tuna, salmon, trout, mackerel, shrimp, scallops - Avoid or limit high fat processed meats such as sausage and bacon  Dairy - Include moderate amounts of low fat dairy products  - Focus on healthy dairy such as fat free yogurt, skim milk, low or reduced fat cheese - Limit dairy products higher in fat such as whole or 2% milk, cheese, ice cream  Alcohol - Moderate amounts of red wine is ok  - No more than 5 oz daily for women (all ages) and men older than age 74  - No more than 10 oz of wine daily for men younger than 44  Other - Limit sweets and other desserts  - Use herbs and spices instead of salt to flavor foods  - Herbs and spices common to the traditional Mediterranean Diet include: basil, bay leaves, chives, cloves, cumin, fennel, garlic, lavender, marjoram, mint, oregano, parsley, pepper, rosemary, sage, savory, sumac, tarragon, thyme   It's not just a diet, it's a lifestyle:  . The Mediterranean diet includes lifestyle factors typical of those in the region  . Foods, drinks and meals are best eaten  with others and savored . Daily physical activity is important for overall good health . This could be strenuous exercise like running and aerobics . This could also be more leisurely activities such as walking, housework, yard-work, or taking the stairs . Moderation is the key; a balanced and healthy diet accommodates most foods and drinks . Consider portion sizes and frequency of consumption of certain foods   Meal Ideas & Options:  . Breakfast:  o Whole wheat toast or whole wheat English muffins with peanut butter & hard boiled egg o Steel cut oats topped with apples & cinnamon and skim milk  o Fresh fruit: banana, strawberries, melon, berries, peaches  o Smoothies: strawberries, bananas, greek yogurt, peanut butter o Low fat greek yogurt with blueberries and granola  o Egg white omelet with spinach and mushrooms o Breakfast couscous: whole wheat couscous, apricots, skim milk, cranberries  . Sandwiches:  o Hummus and grilled vegetables (peppers, zucchini, squash) on whole wheat bread   o Grilled chicken on whole wheat pita with lettuce, tomatoes, cucumbers or tzatziki  o Tuna salad on whole wheat bread: tuna salad made with greek yogurt, olives, red peppers, capers, green onions o Garlic rosemary lamb pita: lamb sauted with garlic, rosemary, salt & pepper; add lettuce, cucumber, greek yogurt to pita - flavor with lemon juice and black pepper  . Seafood:  o Mediterranean grilled salmon, seasoned with garlic, basil, parsley, lemon juice and black pepper o Shrimp, lemon, and spinach  whole-grain pasta salad made with low fat greek yogurt  o Seared scallops with lemon orzo  o Seared tuna steaks seasoned salt, pepper, coriander topped with tomato mixture of olives, tomatoes, olive oil, minced garlic, parsley, green onions and cappers  . Meats:  o Herbed greek chicken salad with kalamata olives, cucumber, feta  o Red bell peppers stuffed with spinach, bulgur, lean ground beef (or lentils) &  topped with feta   o Kebabs: skewers of chicken, tomatoes, onions, zucchini, squash  o [place] burgers: made with red onions, mint, dill, lemon juice, feta cheese topped with roasted red peppers . Vegetarian o Cucumber salad: cucumbers, artichoke hearts, celery, red onion, feta cheese, tossed in olive oil & lemon juice  o Hummus and whole grain pita points with a greek salad (lettuce, tomato, feta, olives, cucumbers, red onion) o Lentil soup with celery, carrots made with vegetable broth, garlic, salt and pepper  o Tabouli salad: parsley, bulgur, mint, scallions, cucumbers, tomato, radishes, lemon juice, olive oil, salt and pepper.      American Heart Association (AHA) Exercise Recommendation  Being physically active is important to prevent heart disease and stroke, the nation's No. 1and No. 5killers. To improve overall cardiovascular health, we suggest at least 150 minutes per week of moderate exercise or 75 minutes per week of vigorous exercise (or a combination of moderate and vigorous activity). Thirty minutes a day, five times a week is an easy goal to remember. You will also experience benefits even if you divide your time into two or three segments of 10 to 15 minutes per day.  For people who would benefit from lowering their blood pressure or cholesterol, we recommend 40 minutes of aerobic exercise of moderate to vigorous intensity three to four times a week to lower the risk for heart attack and stroke.  Physical activity is anything that makes you move your body and burn calories.  This includes things like climbing stairs or playing sports. Aerobic exercises benefit your heart, and include walking, jogging, swimming or biking. Strength and stretching exercises are best for overall stamina and flexibility.  The simplest, positive change you can make to effectively improve your heart health is to start walking. It's enjoyable, free, easy, social and great exercise. A walking program is  flexible and boasts high success rates because people can stick with it. It's easy for walking to become a regular and satisfying part of life.   For Overall Cardiovascular Health:  At least 30 minutes of moderate-intensity aerobic activity at least 5 days per week for a total of 150  OR   At least 25 minutes of vigorous aerobic activity at least 3 days per week for a total of 75 minutes; or a combination of moderate- and vigorous-intensity aerobic activity  AND   Moderate- to high-intensity muscle-strengthening activity at least 2 days per week for additional health benefits.  For Lowering Blood Pressure and Cholesterol  An average 40 minutes of moderate- to vigorous-intensity aerobic activity 3 or 4 times per week  What if I can't make it to the time goal? Something is always better than nothing! And everyone has to start somewhere. Even if you've been sedentary for years, today is the day you can begin to make healthy changes in your life. If you don't think you'll make it for 30 or 40 minutes, set a reachable goal for today. You can work up toward your overall goal by increasing your time as you get stronger. Don't let all-or-nothing thinking  rob you of doing what you can every day.  Source:http://www.heart.org

## 2014-08-12 ENCOUNTER — Telehealth: Payer: Self-pay | Admitting: Physician Assistant

## 2014-08-12 DIAGNOSIS — N951 Menopausal and female climacteric states: Secondary | ICD-10-CM

## 2014-08-12 NOTE — Telephone Encounter (Signed)
Pt stated that the new medication pt started for hot flashes venlafaxine XR (EFFEXOR-XR) 75 MG 24 hr capsule had increased her hot flashes. Pt stated that she had been taking the medication in the morning. Thanks TNP

## 2014-08-13 NOTE — Telephone Encounter (Signed)
How should patient adjust her medication? Leslie Koch not in office today, and patient called yesterday regarding this. Could you please review? Thanks!

## 2014-08-13 NOTE — Telephone Encounter (Signed)
Advise taking the Venlafaxine-XR 75 mg daily for at least a month to get full blood levels and full effect to help control hot flashes. May need to increase dosage at that time if still having significant hot flashes.

## 2014-08-13 NOTE — Telephone Encounter (Signed)
Advised patient as below. Patient reports that she could not continue taking Venlafaxine due to med having the opposite effects on her. Patient reports that her symptoms became 10x worse. Patient is willing to try another alternative (if you have one). Patient reports that if she could take the med the entire month she would, but she could not bear the hot flashes.

## 2014-08-14 DIAGNOSIS — N951 Menopausal and female climacteric states: Secondary | ICD-10-CM | POA: Insufficient documentation

## 2014-08-14 MED ORDER — PAROXETINE MESYLATE 7.5 MG PO CAPS: 1.0000 | ORAL_CAPSULE | Freq: Every day | ORAL | Status: DC

## 2014-08-14 NOTE — Telephone Encounter (Signed)
Will try paroxetine instead.  New Rx sent to [facility].  Once she gets Rx she may discontinue venlafaxine and start paroxetine.  Thanks!

## 2014-08-14 NOTE — Telephone Encounter (Signed)
Left patient a message on cell voicemail advising patient that new RX has been sent to pharmacy and discontinue old RX when she gets the new RX.

## 2014-08-27 ENCOUNTER — Telehealth: Payer: Self-pay | Admitting: Physician Assistant

## 2014-08-27 NOTE — Telephone Encounter (Deleted)
P 

## 2014-09-06 ENCOUNTER — Ambulatory Visit: Payer: BLUE CROSS/BLUE SHIELD | Admitting: Physician Assistant

## 2014-11-01 ENCOUNTER — Other Ambulatory Visit: Payer: Self-pay | Admitting: Family Medicine

## 2014-12-11 ENCOUNTER — Other Ambulatory Visit: Payer: Self-pay | Admitting: Family Medicine

## 2014-12-11 MED ORDER — LISINOPRIL-HYDROCHLOROTHIAZIDE 20-12.5 MG PO TABS: 1.0000 | ORAL_TABLET | Freq: Every day | ORAL | Status: DC

## 2015-01-01 ENCOUNTER — Other Ambulatory Visit: Payer: Self-pay | Admitting: *Deleted

## 2015-01-01 DIAGNOSIS — E7800 Pure hypercholesterolemia, unspecified: Secondary | ICD-10-CM

## 2015-01-01 MED ORDER — SIMVASTATIN 40 MG PO TABS: 40.0000 mg | ORAL_TABLET | Freq: Every day | ORAL | Status: DC

## 2015-01-29 ENCOUNTER — Telehealth: Payer: Self-pay | Admitting: Physician Assistant

## 2015-01-29 MED ORDER — ESTROGENS CONJUGATED 0.45 MG PO TABS: 0.4500 mg | ORAL_TABLET | Freq: Every day | ORAL | Status: DC

## 2015-01-29 NOTE — Telephone Encounter (Signed)
Left message prescription was sent as directed below.  Thanks,  -Joseline

## 2015-01-29 NOTE — Telephone Encounter (Signed)
Pt states the dosage on the Rx PREMARIN 0.45 MG tablet is incorrect.  Pt states she has always taken 1 every day. Pt is requesting a new Rx sent for the correct dosage.   CB#[phone]

## 2015-01-29 NOTE — Telephone Encounter (Signed)
[facility] daily use.

## 2015-02-10 ENCOUNTER — Other Ambulatory Visit: Payer: Self-pay | Admitting: Physician Assistant

## 2015-02-10 MED ORDER — VALACYCLOVIR HCL 1 G PO TABS: 1000.0000 mg | ORAL_TABLET | Freq: Two times a day (BID) | ORAL | Status: DC | PRN

## 2015-02-10 NOTE — Telephone Encounter (Signed)
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 Could you refill for patient?

## 2015-02-10 NOTE — Telephone Encounter (Signed)
Pt needs refill valACYclovir (VALTREX) 1000 MG tablet   [facility] uses [facility]  Her call is [phone]  Thanks TEri

## 2015-02-11 ENCOUNTER — Telehealth: Payer: Self-pay | Admitting: Physician Assistant

## 2015-02-11 MED ORDER — VALACYCLOVIR HCL 1 G PO TABS: 1000.0000 mg | ORAL_TABLET | Freq: Two times a day (BID) | ORAL | Status: DC | PRN

## 2015-02-11 NOTE — Telephone Encounter (Signed)
Pt requested a refill for valACYclovir (VALTREX) 1000 MG tablet and it was approved and sent to [facility]. Pt would like a weeks supply sent to [facility] to last her until the mail order RX gets to her. Thanks TNP

## 2015-02-11 NOTE — Telephone Encounter (Signed)
Please review. Ok to send into local pharmacy?  

## 2015-02-11 NOTE — Telephone Encounter (Signed)
Meds sent to [facility]

## 2015-02-11 NOTE — Telephone Encounter (Signed)
Advised patient as below.  

## 2015-03-27 ENCOUNTER — Telehealth: Payer: Self-pay | Admitting: Physician Assistant

## 2015-03-27 DIAGNOSIS — B379 Candidiasis, unspecified: Secondary | ICD-10-CM

## 2015-03-27 MED ORDER — FLUCONAZOLE 150 MG PO TABS: 150.0000 mg | ORAL_TABLET | Freq: Once | ORAL | Status: DC

## 2015-03-27 NOTE — Telephone Encounter (Signed)
Rx sent to [facility]. Hold simvastatin x 3 days.  She will need to be seen if this doesn't improve symptoms.  Thanks.

## 2015-03-27 NOTE — Telephone Encounter (Signed)
Pt would like an RX for Diflucan b/c she thinks she has a yeast infection and has tried things over the counter and they haven't worked. Pt would like it sent to [facility]. I advised that I wasn't sure if we could send it in without an OV but I would ask. Please advise. Thanks TNP

## 2015-03-27 NOTE — Telephone Encounter (Signed)
Patient advised as directed below. Patient verbalized understanding.  

## 2015-05-19 DIAGNOSIS — M1712 Unilateral primary osteoarthritis, left knee: Secondary | ICD-10-CM | POA: Diagnosis not present

## 2015-06-06 DIAGNOSIS — Z96651 Presence of right artificial knee joint: Secondary | ICD-10-CM | POA: Diagnosis not present

## 2015-06-06 DIAGNOSIS — M25561 Pain in right knee: Secondary | ICD-10-CM | POA: Diagnosis not present

## 2015-06-07 ENCOUNTER — Other Ambulatory Visit: Payer: Self-pay | Admitting: Family Medicine

## 2015-06-07 DIAGNOSIS — I10 Essential (primary) hypertension: Secondary | ICD-10-CM

## 2015-07-23 ENCOUNTER — Telehealth: Payer: Self-pay | Admitting: Emergency Medicine

## 2015-07-23 DIAGNOSIS — B001 Herpesviral vesicular dermatitis: Secondary | ICD-10-CM

## 2015-07-23 MED ORDER — VALACYCLOVIR HCL 1 G PO TABS: 1000.0000 mg | ORAL_TABLET | Freq: Two times a day (BID) | ORAL | Status: DC | PRN

## 2015-07-23 NOTE — Telephone Encounter (Signed)
Pt requesting a refill on valtrex 1000 mg [facility] [place]. She has a fever blister on her lip right now. Please advise.

## 2015-07-23 NOTE — Telephone Encounter (Signed)
Please review. Thanks!  

## 2015-07-23 NOTE — Telephone Encounter (Signed)
Sent to [facility]

## 2015-08-18 NOTE — Telephone Encounter (Signed)
error 

## 2015-10-16 DIAGNOSIS — M1712 Unilateral primary osteoarthritis, left knee: Secondary | ICD-10-CM | POA: Diagnosis not present

## 2015-10-20 ENCOUNTER — Telehealth: Payer: Self-pay

## 2015-10-20 NOTE — Telephone Encounter (Signed)
Called pt to schedule OV for pre-op exam. Pt prefers to contact surgeon to see the optimal time to have this scheduled. Will call back. Renaldo Fiddler, CMA

## 2015-12-18 ENCOUNTER — Ambulatory Visit: Payer: Self-pay | Admitting: Orthopedic Surgery

## 2016-01-06 ENCOUNTER — Other Ambulatory Visit: Payer: Self-pay | Admitting: Physician Assistant

## 2016-01-09 ENCOUNTER — Ambulatory Visit (INDEPENDENT_AMBULATORY_CARE_PROVIDER_SITE_OTHER): Payer: BLUE CROSS/BLUE SHIELD | Admitting: Physician Assistant

## 2016-01-09 ENCOUNTER — Encounter: Payer: Self-pay | Admitting: Physician Assistant

## 2016-01-09 VITALS — BP 128/80 | HR 84 | Temp 98.0°F | Resp 16 | Ht 62.0 in | Wt 216.2 lb

## 2016-01-09 DIAGNOSIS — Z1231 Encounter for screening mammogram for malignant neoplasm of breast: Secondary | ICD-10-CM | POA: Diagnosis not present

## 2016-01-09 DIAGNOSIS — B001 Herpesviral vesicular dermatitis: Secondary | ICD-10-CM

## 2016-01-09 DIAGNOSIS — Z1239 Encounter for other screening for malignant neoplasm of breast: Secondary | ICD-10-CM

## 2016-01-09 DIAGNOSIS — Z01818 Encounter for other preprocedural examination: Secondary | ICD-10-CM

## 2016-01-09 DIAGNOSIS — R252 Cramp and spasm: Secondary | ICD-10-CM

## 2016-01-09 DIAGNOSIS — M1711 Unilateral primary osteoarthritis, right knee: Secondary | ICD-10-CM | POA: Diagnosis not present

## 2016-01-09 DIAGNOSIS — E7800 Pure hypercholesterolemia, unspecified: Secondary | ICD-10-CM | POA: Diagnosis not present

## 2016-01-09 DIAGNOSIS — Z0000 Encounter for general adult medical examination without abnormal findings: Secondary | ICD-10-CM

## 2016-01-09 DIAGNOSIS — I10 Essential (primary) hypertension: Secondary | ICD-10-CM

## 2016-01-09 MED ORDER — VALACYCLOVIR HCL 1 G PO TABS: 1000.0000 mg | ORAL_TABLET | Freq: Two times a day (BID) | ORAL | 3 refills | Status: DC | PRN

## 2016-01-09 NOTE — Progress Notes (Signed)
Patient: Leslie Koch, Female    DOB: 08-Jun-1955, 60 y.o.   MRN: [id number] Visit Date: 01/09/2016  Today's Provider: Mar Daring, [name]   Chief Complaint  Patient presents with  . Annual Exam   Subjective:    Annual physical exam Leslie Koch is a 60 y.o. female who presents today for health maintenance and complete physical. She feels fairly well. She reports exercising none but walks every day at work. She reports she is sleeping fairly well.   Colonoscopy:03/30/2007 Mammogram:02/06/13 BI-RADS 1 S/P Hysterectomy-no previous abnormal pap ----------------------------------------------------------------- She also reports that she is having severe cramps in her left groin area that are waking her up from her sleep. She reports it occurs most often after a long, hard day at work. When it occurs it happens in the medial muscle group from the hip to outside the left knee.   Patient also needs surgical clearance.She is scheduled to have surgery on the right knee: TK-Revision both femoral and tibial components February 11, 2016.  She is also requesting a refill on Valtrex. She notice the fever blister last night. It itches.  Review of Systems  Constitutional: Negative.   HENT: Negative.   Eyes: Negative.   Respiratory: Negative.   Cardiovascular: Negative.   Gastrointestinal: Negative.   Endocrine: Negative.   Genitourinary: Negative.   Musculoskeletal: Positive for arthralgias, back pain, gait problem and myalgias.  Skin: Negative.   Allergic/Immunologic: Negative.   Neurological: Negative for dizziness, weakness, light-headedness, numbness and headaches.  Hematological: Negative.   Psychiatric/Behavioral: Negative.     Social History      She  reports that she has quit smoking. Her smoking use included Cigarettes. She has a 60.00 pack-year smoking history. She has never used smokeless tobacco. She reports that she drinks alcohol. She reports that she does  not use drugs.       Social History   Social History  . Marital status: Married    Spouse name: N/A  . Number of children: N/A  . Years of education: N/A   Social History Main Topics  . Smoking status: Former Smoker    Packs/day: 2.00    Years: 30.00    Types: Cigarettes  . Smokeless tobacco: Never Used     Comment: quit 04/14/1995  . Alcohol use 0.0 oz/week     Comment: occasionally 2-3 drinks once or twice a month  . Drug use: No  . Sexual activity: Not Asked   Other Topics Concern  . None   Social History Narrative  . None    History reviewed. No pertinent past medical history.   Patient Active Problem List   Diagnosis Date Noted  . Hot flashes, menopausal 08/14/2014  . Cystocele 08/09/2014  . Rectocele 08/09/2014  . Anxiety 08/06/2014  . Acid reflux 08/06/2014  . Hypercholesteremia 08/06/2014  . Benign essential HTN 08/06/2014  . Cannot sleep 08/06/2014  . Post hysterectomy menopause 08/06/2014  . Avitaminosis D 08/06/2014    Past Surgical History:  Procedure Laterality Date  . ABDOMINAL HYSTERECTOMY  01/1999  . CYST EXCISION  1974  . KNEE SURGERY Right 07/2010  . 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 TUBAL LIGATION  1984    Family History        Family Status  Relation Status  . Mother Alive  . Father Deceased at age 49  . Maternal Grandmother Deceased  . Maternal Grandfather Deceased  . Paternal Grandmother Deceased  . Paternal  Grandfather Deceased        Her family history includes Breast cancer in her paternal grandmother; Coronary artery disease in her maternal grandfather and mother; Diabetes in her mother; Hypertension in her mother; Lung cancer in her father; Stroke in her maternal grandmother.     Allergies  Allergen Reactions  . Montelukast Sodium Rash     Current Outpatient Prescriptions:  .  aspirin 81 MG tablet, Take 1 tablet by mouth daily., Disp: , Rfl:  .  Calcium Carbonate-Vitamin D (OYSTER SHELL CALCIUM/D PO), Take 1  tablet by mouth 2 (two) times daily., Disp: , Rfl:  .  celecoxib (CELEBREX) 200 MG capsule, Take 1 capsule by mouth daily., Disp: , Rfl:  .  cetirizine (ZYRTEC ALLERGY) 10 MG tablet, Take 1 tablet by mouth daily., Disp: , Rfl:  .  Cholecalciferol 1000 UNITS capsule, Take 1 capsule by mouth 2 (two) times daily., Disp: , Rfl:  .  Glucosamine-Chondroit-Vit C-Mn (GLUCOSAMINE CHONDR 1500 COMPLX) CAPS, Take 1 capsule by mouth 2 (two) times daily., Disp: , Rfl:  .  lisinopril-hydrochlorothiazide (PRINZIDE,ZESTORETIC) 20-12.5 MG tablet, TAKE 1 TABLET DAILY, Disp: 90 tablet, Rfl: 3 .  MULTIPLE VITAMIN PO, Take 1 tablet by mouth daily., Disp: , Rfl:  .  OMEGA-3 FATTY ACIDS PO, Take 2 capsules by mouth 2 (two) times daily., Disp: , Rfl:  .  Omeprazole 20 MG TBEC, Take 1 tablet by mouth 2 (two) times daily., Disp: , Rfl:  .  PARoxetine Mesylate 7.5 MG CAPS, Take 1 capsule by mouth daily with breakfast., Disp: 30 capsule, Rfl: 1 .  PREMARIN 0.45 MG tablet, TAKE 1 TABLET DAILY, Disp: 90 tablet, Rfl: 3 .  simvastatin (ZOCOR) 40 MG tablet, Take 1 tablet (40 mg total) by mouth at bedtime., Disp: 90 tablet, Rfl: 3 .  traMADol (ULTRAM) 50 MG tablet, Take 1 tablet by mouth daily as needed., Disp: , Rfl:  .  valACYclovir (VALTREX) 1000 MG tablet, Take 1-2 tablets (1,000-2,000 mg total) by mouth every 12 (twelve) hours as needed., Disp: 28 tablet, Rfl: 3 .  fluconazole (DIFLUCAN) 150 MG tablet, Take 1 tablet (150 mg total) by mouth once. (Patient not taking: Reported on 01/09/2016), Disp: 1 tablet, Rfl: 0   Patient Care Team: Mar Daring, [name] as PCP - General (Physician Assistant)      Objective:   Vitals: BP 128/80 (BP Location: Right Arm, Patient Position: Sitting, Cuff Size: Large)   Pulse 84   Temp 98 F (36.7 C) (Oral)   Resp 16   Ht 5\' 2"  (1.575 m)   Wt 216 lb 3.2 oz (98.1 kg)   BMI 39.54 kg/m    Physical Exam  Constitutional: She is oriented to person, place, and time. She appears  well-developed and well-nourished. No distress.  HENT:  Head: Normocephalic and atraumatic.  Right Ear: Hearing, tympanic membrane, external ear and ear canal normal.  Left Ear: Hearing, tympanic membrane, external ear and ear canal normal.  Nose: Nose normal.  Mouth/Throat: Uvula is midline, oropharynx is clear and moist and mucous membranes are normal. No oropharyngeal exudate.  Eyes: Conjunctivae and EOM are normal. Pupils are equal, round, and reactive to light. Right eye exhibits no discharge. Left eye exhibits no discharge. No scleral icterus.  Neck: Normal range of motion. Neck supple. No JVD present. Carotid bruit is not present. No tracheal deviation present. No thyromegaly present.  Cardiovascular: Normal rate, regular rhythm, normal heart sounds and intact distal pulses.  Exam reveals no gallop and no  friction rub.   No murmur heard. Pulmonary/Chest: Effort normal and breath sounds normal. No respiratory distress. She has no wheezes. She has no rales. She exhibits no tenderness. Right breast exhibits no inverted nipple, no mass, no nipple discharge, no skin change and no tenderness. Left breast exhibits no inverted nipple, no mass, no nipple discharge, no skin change and no tenderness. Breasts are symmetrical.  Abdominal: Soft. Bowel sounds are normal. She exhibits no distension and no mass. There is no tenderness. There is no rebound and no guarding.  Musculoskeletal: Normal range of motion. She exhibits no edema or tenderness.  Lymphadenopathy:    She has no cervical adenopathy.  Neurological: She is alert and oriented to person, place, and time.  Skin: Skin is warm and dry. No rash noted. She is not diaphoretic.  Psychiatric: She has a normal mood and affect. Her behavior is normal. Judgment and thought content normal.  Vitals reviewed.   Depression Screen No flowsheet data found.   Assessment & Plan:     Routine Health Maintenance and Physical Exam  Exercise Activities  and Dietary recommendations Goals    None      Immunization History  Administered Date(s) Administered  . Tdap 02/24/2007    Health Maintenance  Topic Date Due  . Hepatitis C Screening  [birth year]  . HIV Screening  04/23/1971  . PAP SMEAR  04/22/1977  . MAMMOGRAM  04/23/2006  . COLONOSCOPY  04/23/2006  . INFLUENZA VACCINE  08/12/2015  . TETANUS/TDAP  02/23/2017     Discussed health benefits of physical activity, and encouraged her to engage in regular exercise appropriate for her age and condition.    1. Annual physical exam Normal physical exam today. Will check labs as below and f/u pending lab results. If labs are stable and WNL she will not need to have these rechecked for one year at her next annual physical exam. She is to call the office in the meantime if she has any acute issue, questions or concerns. - CBC with Differential/Platelet - TSH  2. Benign essential HTN Stable. Continue lisinopril-HCTZ 20-12.5mg  daily. Will check labs as below and f/u pending results. - Hemoglobin A1c  3. Hypercholesteremia Stable. Continue Simvastatin 40mg . Will check labs as below and f/u pending results. - Comprehensive metabolic panel  4. Screening for breast cancer Breast exam today was normal. There is no family history of breast cancer. She does perform regular self breast exams. Mammogram was ordered as below. Information for [facility] clinic was given to patient so she may schedule her mammogram at her convenience. - MM DIGITAL SCREENING BILATERAL; Future  5. Pre-op evaluation Surgically cleared. EKG today shows NSR at rate of 74. No ST elevation. She is scheduled for TK revision of the right knee. This will be the 2nd revision and 3rd surgery. This is scheduled on 02/11/16. - EKG 12-Lead  6. Cold sore Stable. Diagnosis pulled for medication refill. Continue current medical treatment plan. - valACYclovir (VALTREX) 1000 MG tablet; Take 1-2 tablets (1,000-2,000 mg total)  by mouth every 12 (twelve) hours as needed.  Dispense: 28 tablet; Refill: 3  7. Muscle cramp Thought to be secondary to dehydration and compensation. Advised patient to push fluids and may use magnesium sulfate 250 mg when she has a long day at work. She is to call if there is no changes or improvement or if symptoms worsen.  --------------------------------------------------------------------    Mar Daring, [name]  [facility]

## 2016-01-09 NOTE — Patient Instructions (Signed)

## 2016-01-22 ENCOUNTER — Other Ambulatory Visit: Payer: Self-pay | Admitting: Physician Assistant

## 2016-01-22 DIAGNOSIS — E7800 Pure hypercholesterolemia, unspecified: Secondary | ICD-10-CM

## 2016-01-23 DIAGNOSIS — Z0000 Encounter for general adult medical examination without abnormal findings: Secondary | ICD-10-CM | POA: Diagnosis not present

## 2016-01-23 DIAGNOSIS — I10 Essential (primary) hypertension: Secondary | ICD-10-CM | POA: Diagnosis not present

## 2016-01-23 DIAGNOSIS — E7800 Pure hypercholesterolemia, unspecified: Secondary | ICD-10-CM | POA: Diagnosis not present

## 2016-01-24 LAB — COMPREHENSIVE METABOLIC PANEL
ALK PHOS: 63 IU/L (ref 39–117)
ALT: 18 IU/L (ref 0–32)
AST: 15 IU/L (ref 0–40)
Albumin/Globulin Ratio: 1.5 (ref 1.2–2.2)
Albumin: 3.9 g/dL (ref 3.5–5.5)
BILIRUBIN TOTAL: 0.3 mg/dL (ref 0.0–1.2)
BUN / CREAT RATIO: 23 (ref 9–23)
BUN: 14 mg/dL (ref 6–24)
CHLORIDE: 102 mmol/L (ref 96–106)
CO2: 25 mmol/L (ref 18–29)
CREATININE: 0.62 mg/dL (ref 0.57–1.00)
Calcium: 8.9 mg/dL (ref 8.7–10.2)
GFR calc Af Amer: 114 mL/min/{1.73_m2} (ref >59)
GFR calc non Af Amer: 99 mL/min/{1.73_m2} (ref >59)
GLUCOSE: 91 mg/dL (ref 65–99)
Globulin, Total: 2.6 g/dL (ref 1.5–4.5)
Potassium: 4.7 mmol/L (ref 3.5–5.2)
Sodium: 142 mmol/L (ref 134–144)
Total Protein: 6.5 g/dL (ref 6.0–8.5)

## 2016-01-24 LAB — CBC WITH DIFFERENTIAL/PLATELET
BASOS ABS: 0 10*3/uL (ref 0.0–0.2)
Basos: 1 %
EOS (ABSOLUTE): 0.2 10*3/uL (ref 0.0–0.4)
Eos: 3 %
Hematocrit: 41.6 % (ref 34.0–46.6)
Hemoglobin: 14.2 g/dL (ref 11.1–15.9)
Immature Grans (Abs): 0 10*3/uL (ref 0.0–0.1)
Immature Granulocytes: 0 %
LYMPHS ABS: 2.4 10*3/uL (ref 0.7–3.1)
Lymphs: 39 %
MCH: 31.8 pg (ref 26.6–33.0)
MCHC: 34.1 g/dL (ref 31.5–35.7)
MCV: 93 fL (ref 79–97)
MONOCYTES: 5 %
Monocytes Absolute: 0.3 10*3/uL (ref 0.1–0.9)
NEUTROS ABS: 3.2 10*3/uL (ref 1.4–7.0)
Neutrophils: 52 %
PLATELETS: 290 10*3/uL (ref 150–379)
RBC: 4.46 x10E6/uL (ref 3.77–5.28)
RDW: 14.4 % (ref 12.3–15.4)
WBC: 6.1 10*3/uL (ref 3.4–10.8)

## 2016-01-24 LAB — HEMOGLOBIN A1C
ESTIMATED AVERAGE GLUCOSE: 111 mg/dL
HEMOGLOBIN A1C: 5.5 % (ref 4.8–5.6)

## 2016-01-24 LAB — TSH: TSH: 1.85 u[IU]/mL (ref 0.450–4.500)

## 2016-02-03 NOTE — Patient Instructions (Addendum)
Leslie Koch  02/03/2016   Your procedure is scheduled on: 02/11/16  Report to [facility] Main  Entrance take [place]  elevators to 3rd floor to  [place] at    [place] AM.  Call this number if you have problems the morning of surgery [phone]   Remember: ONLY 1 PERSON MAY GO WITH YOU TO SHORT STAY TO GET  READY MORNING OF YOUR SURGERY.  Do not eat food or drink liquids :After Midnight.     Take these medicines the morning of surgery with A SIP OF WATER:  Zyrtec , omeprazole,                                 You may not have any metal on your body including hair pins and              piercings  Do not wear jewelry, make-up, lotions, powders or perfumes, deodorant             Do not wear nail polish.  Do not shave  48 hours prior to surgery.              Do not bring valuables to the hospital. [place].  Contacts, dentures or bridgework may not be worn into surgery.  Leave suitcase in the car. After surgery it may be brought to your room.                  Please read over the following fact sheets you were given: _____________________________________________________________________             [facility] - Preparing for Surgery Before surgery, you can play an important role.  Because skin is not sterile, your skin needs to be as free of germs as possible.  You can reduce the number of germs on your skin by washing with CHG (chlorahexidine gluconate) soap before surgery.  CHG is an antiseptic cleaner which kills germs and bonds with the skin to continue killing germs even after washing. Please DO NOT use if you have an allergy to CHG or antibacterial soaps.  If your skin becomes reddened/irritated stop using the CHG and inform your nurse when you arrive at Short Stay. Do not shave (including legs and underarms) for at least 48 hours prior to the first CHG shower.  You may shave your face/neck. Please  follow these instructions carefully:  1.  Shower with CHG Soap the night before surgery and the  morning of Surgery.  2.  If you choose to wash your hair, wash your hair first as usual with your  normal  shampoo.  3.  After you shampoo, rinse your hair and body thoroughly to remove the  shampoo.                           4.  Use CHG as you would any other liquid soap.  You can apply chg directly  to the skin and wash                       Gently with a scrungie or clean washcloth.  5.  Apply the CHG Soap to  your body ONLY FROM THE NECK DOWN.   Do not use on face/ open                           Wound or open sores. Avoid contact with eyes, ears mouth and genitals (private parts).                       Wash face,  Genitals (private parts) with your normal soap.             6.  Wash thoroughly, paying special attention to the area where your surgery  will be performed.  7.  Thoroughly rinse your body with warm water from the neck down.  8.  DO NOT shower/wash with your normal soap after using and rinsing off  the CHG Soap.                9.  Pat yourself dry with a clean towel.            10.  Wear clean pajamas.            11.  Place clean sheets on your bed the night of your first shower and do not  sleep with pets. Day of Surgery : Do not apply any lotions/deodorants the morning of surgery.  Please wear clean clothes to the hospital/surgery center.  FAILURE TO FOLLOW THESE INSTRUCTIONS MAY RESULT IN THE CANCELLATION OF YOUR SURGERY PATIENT SIGNATURE_________________________________  NURSE SIGNATURE__________________________________  ________________________________________________________________________  WHAT IS A BLOOD TRANSFUSION? Blood Transfusion Information  A transfusion is the replacement of blood or some of its parts. Blood is made up of multiple cells which provide different functions.  Red blood cells carry oxygen and are used for blood loss replacement.  White blood cells  fight against infection.  Platelets control bleeding.  Plasma helps clot blood.  Other blood products are available for specialized needs, such as hemophilia or other clotting disorders. BEFORE THE TRANSFUSION  Who gives blood for transfusions?   Healthy volunteers who are fully evaluated to make sure their blood is safe. This is blood bank blood. Transfusion therapy is the safest it has ever been in the practice of medicine. Before blood is taken from a donor, a complete history is taken to make sure that person has no history of diseases nor engages in risky social behavior (examples are intravenous drug use or sexual activity with multiple partners). The donor's travel history is screened to minimize risk of transmitting infections, such as malaria. The donated blood is tested for signs of infectious diseases, such as HIV and hepatitis. The blood is then tested to be sure it is compatible with you in order to minimize the chance of a transfusion reaction. If you or a relative donates blood, this is often done in anticipation of surgery and is not appropriate for emergency situations. It takes many days to process the donated blood. RISKS AND COMPLICATIONS Although transfusion therapy is very safe and saves many lives, the main dangers of transfusion include:   Getting an infectious disease.  Developing a transfusion reaction. This is an allergic reaction to something in the blood you were given. Every precaution is taken to prevent this. The decision to have a blood transfusion has been considered carefully by your caregiver before blood is given. Blood is not given unless the benefits outweigh the risks. AFTER THE TRANSFUSION  Right after receiving a blood transfusion, you will usually  feel much better and more energetic. This is especially true if your red blood cells have gotten low (anemic). The transfusion raises the level of the red blood cells which carry oxygen, and this usually  causes an energy increase.  The nurse administering the transfusion will monitor you carefully for complications. HOME CARE INSTRUCTIONS  No special instructions are needed after a transfusion. You may find your energy is better. Speak with your caregiver about any limitations on activity for underlying diseases you may have. SEEK MEDICAL CARE IF:   Your condition is not improving after your transfusion.  You develop redness or irritation at the intravenous (IV) site. SEEK IMMEDIATE MEDICAL CARE IF:  Any of the following symptoms occur over the next 12 hours:  Shaking chills.  You have a temperature by mouth above 102 F (38.9 C), not controlled by medicine.  Chest, back, or muscle pain.  People around you feel you are not acting correctly or are confused.  Shortness of breath or difficulty breathing.  Dizziness and fainting.  You get a rash or develop hives.  You have a decrease in urine output.  Your urine turns a dark color or changes to pink, red, or brown. Any of the following symptoms occur over the next 10 days:  You have a temperature by mouth above 102 F (38.9 C), not controlled by medicine.  Shortness of breath.  Weakness after normal activity.  The white part of the eye turns yellow (jaundice).  You have a decrease in the amount of urine or are urinating less often.  Your urine turns a dark color or changes to pink, red, or brown. Document Released: 12/26/1999 Document Revised: 03/22/2011 Document Reviewed: 08/14/2007 ExitCare Patient Information 2014 [place].  _______________________________________________________________________  Incentive Spirometer  An incentive spirometer is a tool that can help keep your lungs clear and active. This tool measures how well you are filling your lungs with each breath. Taking long deep breaths may help reverse or decrease the chance of developing breathing (pulmonary) problems (especially infection)  following:  A long period of time when you are unable to move or be active. BEFORE THE PROCEDURE   If the spirometer includes an indicator to show your best effort, your nurse or respiratory therapist will set it to a desired goal.  If possible, sit up straight or lean slightly forward. Try not to slouch.  Hold the incentive spirometer in an upright position. INSTRUCTIONS FOR USE  1. Sit on the edge of your bed if possible, or sit up as far as you can in bed or on a chair. 2. Hold the incentive spirometer in an upright position. 3. Breathe out normally. 4. Place the mouthpiece in your mouth and seal your lips tightly around it. 5. Breathe in slowly and as deeply as possible, raising the piston or the ball toward the top of the column. 6. Hold your breath for 3-5 seconds or for as long as possible. Allow the piston or ball to fall to the bottom of the column. 7. Remove the mouthpiece from your mouth and breathe out normally. 8. Rest for a few seconds and repeat Steps 1 through 7 at least 10 times every 1-2 hours when you are awake. Take your time and take a few normal breaths between deep breaths. 9. The spirometer may include an indicator to show your best effort. Use the indicator as a goal to work toward during each repetition. 10. After each set of 10 deep breaths, practice coughing to  be sure your lungs are clear. If you have an incision (the cut made at the time of surgery), support your incision when coughing by placing a pillow or rolled up towels firmly against it. Once you are able to get out of bed, walk around indoors and cough well. You may stop using the incentive spirometer when instructed by your caregiver.  RISKS AND COMPLICATIONS  Take your time so you do not get dizzy or light-headed.  If you are in pain, you may need to take or ask for pain medication before doing incentive spirometry. It is harder to take a deep breath if you are having pain. AFTER USE  Rest and  breathe slowly and easily.  It can be helpful to keep track of a log of your progress. Your caregiver can provide you with a simple table to help with this. If you are using the spirometer at home, follow these instructions: [place] IF:   You are having difficultly using the spirometer.  You have trouble using the spirometer as often as instructed.  Your pain medication is not giving enough relief while using the spirometer.  You develop fever of 100.5 F (38.1 C) or higher. SEEK IMMEDIATE MEDICAL CARE IF:   You cough up bloody sputum that had not been present before.  You develop fever of 102 F (38.9 C) or greater.  You develop worsening pain at or near the incision site. MAKE SURE YOU:   Understand these instructions.  Will watch your condition.  Will get help right away if you are not doing well or get worse. Document Released: 05/10/2006 Document Revised: 03/22/2011 Document Reviewed: 07/11/2006 [facility] Patient Information 2014 [place], [place].   ________________________________________________________________________

## 2016-02-04 ENCOUNTER — Encounter (HOSPITAL_COMMUNITY)
Admission: RE | Admit: 2016-02-04 | Discharge: 2016-02-04 | Disposition: A | Discharge status: Home / Self Care | Payer: BLUE CROSS/BLUE SHIELD | Source: Ambulatory Visit | Attending: Orthopedic Surgery | Admitting: Orthopedic Surgery

## 2016-02-04 ENCOUNTER — Encounter (HOSPITAL_COMMUNITY): Payer: Self-pay

## 2016-02-04 DIAGNOSIS — Z01812 Encounter for preprocedural laboratory examination: Secondary | ICD-10-CM | POA: Insufficient documentation

## 2016-02-04 DIAGNOSIS — T84033A Mechanical loosening of internal left knee prosthetic joint, initial encounter: Secondary | ICD-10-CM | POA: Diagnosis not present

## 2016-02-04 HISTORY — DX: Unspecified osteoarthritis, unspecified site: M19.90

## 2016-02-04 HISTORY — DX: Essential (primary) hypertension: I10

## 2016-02-04 HISTORY — DX: Gastro-esophageal reflux disease without esophagitis: K21.9

## 2016-02-04 HISTORY — DX: Nausea with vomiting, unspecified: R11.2

## 2016-02-04 HISTORY — DX: Other specified postprocedural states: Z98.890

## 2016-02-04 LAB — COMPREHENSIVE METABOLIC PANEL
ALBUMIN: 3.7 g/dL (ref 3.5–5.0)
ALT: 16 U/L (ref 14–54)
AST: 23 U/L (ref 15–41)
Alkaline Phosphatase: 63 U/L (ref 38–126)
Anion gap: 8 (ref 5–15)
BUN: 17 mg/dL (ref 6–20)
CHLORIDE: 102 mmol/L (ref 101–111)
CO2: 28 mmol/L (ref 22–32)
CREATININE: 0.64 mg/dL (ref 0.44–1.00)
Calcium: 8.7 mg/dL — ABNORMAL LOW (ref 8.9–10.3)
GFR calc non Af Amer: >60 mL/min (ref >60)
Glucose, Bld: 93 mg/dL (ref 65–99)
Potassium: 4.1 mmol/L (ref 3.5–5.1)
SODIUM: 138 mmol/L (ref 135–145)
Total Bilirubin: 0.5 mg/dL (ref 0.3–1.2)
Total Protein: 7 g/dL (ref 6.5–8.1)

## 2016-02-04 LAB — PROTIME-INR
INR: 0.99
Prothrombin Time: 13.1 seconds (ref 11.4–15.2)

## 2016-02-04 LAB — APTT: aPTT: 23 seconds — ABNORMAL LOW (ref 24–36)

## 2016-02-04 LAB — SURGICAL PCR SCREEN
MRSA, PCR: NEGATIVE
Staphylococcus aureus: NEGATIVE

## 2016-02-04 LAB — CBC
HCT: 39.3 % (ref 36.0–46.0)
Hemoglobin: 13.3 g/dL (ref 12.0–15.0)
MCH: 30.6 pg (ref 26.0–34.0)
MCHC: 33.8 g/dL (ref 30.0–36.0)
MCV: 90.3 fL (ref 78.0–100.0)
PLATELETS: 269 10*3/uL (ref 150–400)
RBC: 4.35 MIL/uL (ref 3.87–5.11)
RDW: 13.5 % (ref 11.5–15.5)
WBC: 9.1 10*3/uL (ref 4.0–10.5)

## 2016-02-04 LAB — ABO/RH: ABO/RH(D): A POS

## 2016-02-05 NOTE — Progress Notes (Signed)
EKG-01/09/16-epic

## 2016-02-09 ENCOUNTER — Ambulatory Visit: Payer: Self-pay | Admitting: Orthopedic Surgery

## 2016-02-09 NOTE — H&P (Signed)
Leslie Koch DOB: 1955/09/13 Married / Language: English / Race: White Female Date of Admission:  02/11/2016 CC:  Loose right total knee History of Present Illness  The patient is a 61 year old female who comes in  for a preoperative History and Physical. The patient is scheduled for a right total knee arthroplasty (revision) to be performed by Dr. Dione Plover. Aluisio, MD at [facility] on 02-11-2016. The patient is a 61 year old female who presented with knee complaints. The patient was seen for a second opinion. The patient reports right knee symptoms including: pain, swelling, instability and soreness (mostly on the lateral aspect of her lower leg) which began year(s) ago following a specific injury (meniscal tear while doing Zumba in 2012. She had a knee scope, which eventually led to a knee replacement).The patient feels that the symptoms are unchanging (pain is better or worse depending on activity. She does a lot of walking at work, and her knee bothers her more on the days she has to work). Past treatment for this problem has included opioid analgesics and physical therapy. Current treatment includes opioid analgesics (tramadol, prn). Risk factors include total knee replacement (November 2014; with revision on 11/27/13). Unfortunately, her right knee is persistently painful. She has a lot of pretibial pain which goes from just below the joint line down to the mid aspect of the tibia along the anterior and lateral aspect of the leg. It is worse with weightbearing with some discomfort at rest. The knee is stable. She does not get swelling. She is not having any hip pain on that side. She had two operations. The first one being a primary total knee November 2014 which was noted to be loose and was subsequently revised in November 2015 to a long tibial stem. She had persistent tibial pain since. Dr. Rudene Christians had recommended another revision and she wanted to come get a second opinion. She states that  the knee is definitely having a negative effect on her lifestyle. She is unable to do a lot of which she desires. She also has a lot of pain in her left knee which has not been operated on. She feels like the right knee is currently more symptomatic than the left and wants to proceed with revision surgery at this time. They have been treated conservatively in the past for the above stated problem and despite conservative measures, they continue to have progressive pain and severe functional limitations and dysfunction. They have failed non-operative management including home exercise, medications, and subsequent revision procedure. It is felt that they would benefit from undergoing revision of the total joint replacement. Risks and benefits of the procedure have been discussed with the patient and they elect to proceed with surgery. There are no active contraindications to surgery such as ongoing infection or rapidly progressive neurological disease.   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 Rudene Christians Gastroesophageal Reflux Disease  High blood pressure  Hypercholesterolemia  Hemorrhoids  Varicose veins  Menopause   Allergies Singulair *ANTIASTHMATIC AND BRONCHODILATOR AGENTS*  Rash.  Family History Bleeding disorder  Mother. Cancer  Father. Diabetes Mellitus  Mother. First Degree Relatives  reported Heart Disease  Maternal Grandfather, Mother. Heart disease in female family member before age 36  Hypertension  Mother. Osteoarthritis  Mother. Rheumatoid Arthritis  Maternal Grandmother.  Social History Not  under pain contract  No history of drug/alcohol rehab  Number of flights of stairs before winded  2-3 Tobacco use  Former smoker. 12/28/2013: smoke(d) 2 pack(s) per day Tobacco  / smoke exposure  12/28/2013: yes outdoors only Marital status  married Current drinker  12/28/2013: Currently drinks hard liquor only occasionally per week Children  2 Current work status  working full time Living situation  live with spouse Exercise  Exercises rarely; does running / walking  Medication History  Vitamin D3 (1000UNIT Tablet, Oral two times daily) Active. Calcium (500MG  Capsule, 2 Oral two times daily) Active. Aspirin (81MG  Tablet, Oral) Active. Multivitamin (Oral) Active. TraMADol HCl (50MG  Tablet, Oral as needed) Active. ASA Arthritis Strength/Antacid (Oral) Specific strength unknown - Active. ZyrTEC Allergy (Oral) Specific strength unknown - Active. Omeprazole Magnesium (20MG  Tablet DR, Oral two times daily) Active. Lisinopril-Hydrochlorothiazide (20-12.5MG  Tablet, Oral) Active. Premarin (0.45MG  Tablet, Oral) Active. CeleBREX (200MG  Capsule, Oral) Active. Fish Oil (two times daily) Active. Glucosamine Chondroit-Collagen (Oral two times daily) Active. Simvastatin (40MG  Tablet, Oral at bedtime) Active.  Past Surgical History Arthroscopy of Knee  Date: 2012. right Hysterectomy  Date: 01/1999. partial (non-cancerous) Tubal Ligation  Date: 01/1983. Total Knee Replacement - Right  Date: 2014. Revision Right Total Knee  Date: 11/2013.   Review of Systems General Present- Fatigue and Night Sweats. Not Present- Chills, Fever, Memory Loss, Weight Gain and Weight Loss. Skin Not Present- Eczema, Hives, Itching, Lesions and Rash. HEENT Not Present- Dentures, Double Vision, Headache, Hearing Loss, Tinnitus and Visual Loss. Respiratory Not Present- Allergies, Chronic Cough, Coughing up blood, Shortness of breath at rest and Shortness of breath with exertion. Cardiovascular Not Present- Chest Pain, Difficulty Breathing Lying Down, Murmur, Palpitations, Racing/skipping heartbeats and Swelling. Gastrointestinal Not Present- Abdominal Pain, Bloody Stool,  Constipation, Diarrhea, Difficulty Swallowing, Heartburn, Jaundice, Loss of appetitie, Nausea and Vomiting. Female Genitourinary Not Present- Blood in Urine, Discharge, Flank Pain, Incontinence, Painful Urination, Urgency, Urinary frequency, Urinary Retention, Urinating at Night and Weak urinary stream. Musculoskeletal Present- Back Pain, Joint Pain, Joint Swelling, Leg Cramps, Morning Stiffness and Spasms. Not Present- Muscle Pain and Muscle Weakness. Neurological Not Present- Blackout spells, Difficulty with balance, Dizziness, Paralysis, Tremor and Weakness. Psychiatric Not Present- Insomnia.  Vitals Height: 62in Height was reported by patient. Pulse: 76 (Regular)  BP: 142/92 (Sitting, Right Arm, Standard)   Physical Exam  General Mental Status -Alert, cooperative and good historian. General Appearance-pleasant, Not in acute distress. Orientation-Oriented X3. Build & Nutrition-Well nourished and Well developed.  Head and Neck Head-normocephalic, atraumatic . Neck Global Assessment - supple, no bruit auscultated on the right, no bruit auscultated on the left.  Eye Vision-Wears corrective lenses. Pupil - Bilateral-Regular and Round. Motion - Bilateral-EOMI.  ENMT Note: upper and partial lower denture   Chest and Lung Exam Auscultation Breath sounds - clear at anterior chest wall and clear at posterior chest wall. Adventitious sounds - No Adventitious sounds.  Cardiovascular Auscultation Rhythm - Regular rate and rhythm. Heart Sounds - S1 WNL and S2 WNL. Murmurs & Other Heart Sounds - Auscultation of the heart reveals - No Murmurs.  Abdomen Palpation/Percussion Tenderness - Abdomen is non-tender to palpation. Rigidity (guarding) - Abdomen is soft. Auscultation Auscultation of the abdomen reveals - Bowel sounds normal.  Female Genitourinary Note: Not done, not pertinent to present illness   Musculoskeletal Note: On exam, very pleasant,  well-developed female, alert and oriented, in no apparent distress. Evaluation of her hips showed normal range of motion with no discomfort. Evaluation of her left  knee shows no effusion. Her range is about 5 to 130. There is moderate crepitus on range of motion. She is tender medially with no lateral tenderness or instability noted. Pulses, sensation and motor are intact. Right knee, well-healed scar anteriorly. There is no warmth or erythema about the knee. There is no effusion. Range of about 0 to 110. There is no instability noted about the knee. She is tender in the pretibial area all way down to about the mid tibia.  RADIOGRAPHS AP both knees and lateral showed on the left she has bone-on-bone arthritis in the medial compartment with patellofemoral compartment involvement also. Right knee shows a total knee arthroplasty with a long tibial stem which appears non-cemented which is abutting the lateral cortex of the tibia with slight stress reaction and with neocortex around the periphery of the stem. It is consistent with possible loosening of the stem.   Assessment & Plan  Status post total right knee replacement [id number]) Story: revision 11/27/13; Dr. Rudene Christians  Note:Surgical Plans: Revision Right Total Knee  Disposition: Home  PCP: Dr. Caryn Section  IV TXA  Anesthesia Issues: None  Patient was instructed on what medications to stop prior to surgery.  Signed electronically by Ok Edwards, III [name]

## 2016-02-11 ENCOUNTER — Encounter (HOSPITAL_COMMUNITY): Payer: Self-pay | Admitting: *Deleted

## 2016-02-11 ENCOUNTER — Inpatient Hospital Stay (HOSPITAL_COMMUNITY): Payer: BLUE CROSS/BLUE SHIELD | Admitting: Certified Registered Nurse Anesthetist

## 2016-02-11 ENCOUNTER — Encounter (HOSPITAL_COMMUNITY)
Admission: RE | Disposition: A | Discharge status: Home Health | Payer: Self-pay | Source: Ambulatory Visit | Attending: Orthopedic Surgery

## 2016-02-11 ENCOUNTER — Inpatient Hospital Stay (HOSPITAL_COMMUNITY)
Admission: RE | Admit: 2016-02-11 | Discharge: 2016-02-13 | DRG: 468 | Disposition: A | Discharge status: Home Health | Payer: BLUE CROSS/BLUE SHIELD | Source: Ambulatory Visit | Attending: Orthopedic Surgery | Admitting: Orthopedic Surgery

## 2016-02-11 DIAGNOSIS — E7800 Pure hypercholesterolemia, unspecified: Secondary | ICD-10-CM | POA: Diagnosis not present

## 2016-02-11 DIAGNOSIS — Z87891 Personal history of nicotine dependence: Secondary | ICD-10-CM | POA: Diagnosis not present

## 2016-02-11 DIAGNOSIS — T84032A Mechanical loosening of internal right knee prosthetic joint, initial encounter: Secondary | ICD-10-CM | POA: Diagnosis not present

## 2016-02-11 DIAGNOSIS — T84018S Broken internal joint prosthesis, other site, sequela: Secondary | ICD-10-CM

## 2016-02-11 DIAGNOSIS — Z96659 Presence of unspecified artificial knee joint: Secondary | ICD-10-CM

## 2016-02-11 DIAGNOSIS — K219 Gastro-esophageal reflux disease without esophagitis: Secondary | ICD-10-CM | POA: Diagnosis not present

## 2016-02-11 DIAGNOSIS — Z888 Allergy status to other drugs, medicaments and biological substances status: Secondary | ICD-10-CM | POA: Diagnosis not present

## 2016-02-11 DIAGNOSIS — Y838 Other surgical procedures as the cause of abnormal reaction of the patient, or of later complication, without mention of misadventure at the time of the procedure: Secondary | ICD-10-CM | POA: Diagnosis present

## 2016-02-11 DIAGNOSIS — Z7982 Long term (current) use of aspirin: Secondary | ICD-10-CM | POA: Diagnosis not present

## 2016-02-11 DIAGNOSIS — Z96651 Presence of right artificial knee joint: Secondary | ICD-10-CM

## 2016-02-11 DIAGNOSIS — G8918 Other acute postprocedural pain: Secondary | ICD-10-CM | POA: Diagnosis not present

## 2016-02-11 DIAGNOSIS — Z79899 Other long term (current) drug therapy: Secondary | ICD-10-CM

## 2016-02-11 DIAGNOSIS — I10 Essential (primary) hypertension: Secondary | ICD-10-CM | POA: Diagnosis present

## 2016-02-11 DIAGNOSIS — T84018A Broken internal joint prosthesis, other site, initial encounter: Secondary | ICD-10-CM

## 2016-02-11 HISTORY — PX: TOTAL KNEE ARTHROPLASTY WITH REVISION COMPONENTS: SHX6198

## 2016-02-11 LAB — TYPE AND SCREEN
ABO/RH(D): A POS
Antibody Screen: NEGATIVE

## 2016-02-11 SURGERY — TOTAL KNEE ARTHROPLASTY WITH REVISION COMPONENTS
Anesthesia: Monitor Anesthesia Care | Laterality: Right

## 2016-02-11 MED ORDER — PHENYLEPHRINE 40 MCG/ML (10ML) SYRINGE FOR IV PUSH (FOR BLOOD PRESSURE SUPPORT)
PREFILLED_SYRINGE | INTRAVENOUS | Status: AC
  Filled 2016-02-11: qty 10

## 2016-02-11 MED ORDER — ACETAMINOPHEN 650 MG RE SUPP: 650.0000 mg | Freq: Four times a day (QID) | RECTAL | Status: DC | PRN

## 2016-02-11 MED ORDER — METOCLOPRAMIDE HCL 5 MG PO TABS: 5.0000 mg | ORAL_TABLET | Freq: Three times a day (TID) | ORAL | Status: DC | PRN

## 2016-02-11 MED ORDER — TRANEXAMIC ACID 1000 MG/10ML IV SOLN
1000.0000 mg | Freq: Once | INTRAVENOUS | Status: DC
  Filled 2016-02-11: qty 10

## 2016-02-11 MED ORDER — BISACODYL 10 MG RE SUPP: 10.0000 mg | Freq: Every day | RECTAL | Status: DC | PRN

## 2016-02-11 MED ORDER — PROPOFOL 10 MG/ML IV BOLUS
INTRAVENOUS | Status: AC
  Filled 2016-02-11: qty 20

## 2016-02-11 MED ORDER — METOCLOPRAMIDE HCL 5 MG/ML IJ SOLN: 5.0000 mg | Freq: Three times a day (TID) | INTRAMUSCULAR | Status: DC | PRN

## 2016-02-11 MED ORDER — TRANEXAMIC ACID 1000 MG/10ML IV SOLN
1000.0000 mg | INTRAVENOUS | Status: AC
  Administered 2016-02-11: 1000 mg via INTRAVENOUS
  Filled 2016-02-11: qty 1100

## 2016-02-11 MED ORDER — PHENOL 1.4 % MT LIQD
1.0000 | OROMUCOSAL | Status: DC | PRN
Start: 2016-02-11 — End: 2016-02-13

## 2016-02-11 MED ORDER — SODIUM CHLORIDE 0.9 % IJ SOLN
INTRAMUSCULAR | Status: DC | PRN
  Administered 2016-02-11: 30 mL via INTRAVENOUS

## 2016-02-11 MED ORDER — MEPERIDINE HCL 50 MG/ML IJ SOLN
INTRAMUSCULAR | Status: AC
  Filled 2016-02-11: qty 1

## 2016-02-11 MED ORDER — METHOCARBAMOL 500 MG PO TABS
500.0000 mg | ORAL_TABLET | Freq: Four times a day (QID) | ORAL | Status: DC | PRN
  Administered 2016-02-11 – 2016-02-13 (×4): 500 mg via ORAL
  Filled 2016-02-11 (×6): qty 1

## 2016-02-11 MED ORDER — SIMVASTATIN 20 MG PO TABS
40.0000 mg | ORAL_TABLET | Freq: Every day | ORAL | Status: DC
  Administered 2016-02-11 – 2016-02-12 (×2): 40 mg via ORAL
  Filled 2016-02-11 (×2): qty 2

## 2016-02-11 MED ORDER — ONDANSETRON HCL 4 MG/2ML IJ SOLN: 4.0000 mg | Freq: Once | INTRAMUSCULAR | Status: DC | PRN

## 2016-02-11 MED ORDER — MENTHOL 3 MG MT LOZG: 1.0000 | LOZENGE | OROMUCOSAL | Status: DC | PRN

## 2016-02-11 MED ORDER — FENTANYL CITRATE (PF) 100 MCG/2ML IJ SOLN
INTRAMUSCULAR | Status: AC
  Filled 2016-02-11: qty 2

## 2016-02-11 MED ORDER — DEXAMETHASONE SODIUM PHOSPHATE 10 MG/ML IJ SOLN
10.0000 mg | Freq: Once | INTRAMUSCULAR | Status: AC
  Administered 2016-02-11: 10 mg via INTRAVENOUS

## 2016-02-11 MED ORDER — ACETAMINOPHEN 10 MG/ML IV SOLN
1000.0000 mg | Freq: Once | INTRAVENOUS | Status: AC
  Administered 2016-02-11: 1000 mg via INTRAVENOUS
  Filled 2016-02-11: qty 100

## 2016-02-11 MED ORDER — ACETAMINOPHEN 325 MG PO TABS: 650.0000 mg | ORAL_TABLET | Freq: Four times a day (QID) | ORAL | Status: DC | PRN

## 2016-02-11 MED ORDER — BUPIVACAINE LIPOSOME 1.3 % IJ SUSP
INTRAMUSCULAR | Status: DC | PRN
  Administered 2016-02-11: 20 mL

## 2016-02-11 MED ORDER — SODIUM CHLORIDE 0.9 % IJ SOLN
INTRAMUSCULAR | Status: AC
  Filled 2016-02-11: qty 30

## 2016-02-11 MED ORDER — OXYCODONE HCL 5 MG PO TABS
5.0000 mg | ORAL_TABLET | ORAL | Status: DC | PRN
  Administered 2016-02-11 – 2016-02-13 (×13): 10 mg via ORAL
  Filled 2016-02-11 (×13): qty 2

## 2016-02-11 MED ORDER — BUPIVACAINE IN DEXTROSE 0.75-8.25 % IT SOLN
INTRATHECAL | Status: DC | PRN
Start: 2016-02-11 — End: 2016-02-11
  Administered 2016-02-11: 15 mg via INTRATHECAL

## 2016-02-11 MED ORDER — MIDAZOLAM HCL 2 MG/2ML IJ SOLN
INTRAMUSCULAR | Status: AC
  Filled 2016-02-11: qty 2

## 2016-02-11 MED ORDER — RIVAROXABAN 10 MG PO TABS
10.0000 mg | ORAL_TABLET | Freq: Every day | ORAL | Status: DC
  Administered 2016-02-12 – 2016-02-13 (×2): 10 mg via ORAL
  Filled 2016-02-11: qty 1

## 2016-02-11 MED ORDER — SODIUM CHLORIDE 0.9 % IV SOLN
INTRAVENOUS | Status: DC
  Administered 2016-02-11: 18:00:00 via INTRAVENOUS

## 2016-02-11 MED ORDER — ROPIVACAINE HCL 7.5 MG/ML IJ SOLN
INTRAMUSCULAR | Status: DC | PRN
  Administered 2016-02-11: 20 mL via PERINEURAL

## 2016-02-11 MED ORDER — LORATADINE 10 MG PO TABS
10.0000 mg | ORAL_TABLET | Freq: Every day | ORAL | Status: DC
  Administered 2016-02-12 – 2016-02-13 (×2): 10 mg via ORAL
  Filled 2016-02-11 (×2): qty 1

## 2016-02-11 MED ORDER — LIDOCAINE HCL (CARDIAC) 20 MG/ML IV SOLN
INTRAVENOUS | Status: DC | PRN
  Administered 2016-02-11: 50 mg via INTRAVENOUS

## 2016-02-11 MED ORDER — EPHEDRINE 5 MG/ML INJ
INTRAVENOUS | Status: AC
  Filled 2016-02-11: qty 10

## 2016-02-11 MED ORDER — ONDANSETRON HCL 4 MG/2ML IJ SOLN: 4.0000 mg | Freq: Four times a day (QID) | INTRAMUSCULAR | Status: DC | PRN

## 2016-02-11 MED ORDER — DEXAMETHASONE SODIUM PHOSPHATE 10 MG/ML IJ SOLN
10.0000 mg | Freq: Once | INTRAMUSCULAR | Status: AC
  Administered 2016-02-12: 10 mg via INTRAVENOUS
  Filled 2016-02-11: qty 1

## 2016-02-11 MED ORDER — PROPOFOL 500 MG/50ML IV EMUL
INTRAVENOUS | Status: DC | PRN
  Administered 2016-02-11: 75 ug/kg/min via INTRAVENOUS

## 2016-02-11 MED ORDER — PHENYLEPHRINE HCL 10 MG/ML IJ SOLN
INTRAMUSCULAR | Status: DC | PRN
  Administered 2016-02-11 (×2): 80 ug via INTRAVENOUS

## 2016-02-11 MED ORDER — ACETAMINOPHEN 10 MG/ML IV SOLN
INTRAVENOUS | Status: AC
  Filled 2016-02-11: qty 100

## 2016-02-11 MED ORDER — POLYETHYLENE GLYCOL 3350 17 G PO PACK: 17.0000 g | PACK | Freq: Every day | ORAL | Status: DC | PRN

## 2016-02-11 MED ORDER — DOCUSATE SODIUM 100 MG PO CAPS
100.0000 mg | ORAL_CAPSULE | Freq: Two times a day (BID) | ORAL | Status: DC
  Administered 2016-02-11 – 2016-02-13 (×4): 100 mg via ORAL
  Filled 2016-02-11 (×4): qty 1

## 2016-02-11 MED ORDER — ACETAMINOPHEN 500 MG PO TABS
1000.0000 mg | ORAL_TABLET | Freq: Four times a day (QID) | ORAL | Status: AC
  Administered 2016-02-11 – 2016-02-12 (×4): 1000 mg via ORAL
  Filled 2016-02-11 (×4): qty 2

## 2016-02-11 MED ORDER — CEFAZOLIN SODIUM-DEXTROSE 2-4 GM/100ML-% IV SOLN
INTRAVENOUS | Status: AC
  Filled 2016-02-11: qty 100

## 2016-02-11 MED ORDER — DEXAMETHASONE SODIUM PHOSPHATE 10 MG/ML IJ SOLN
INTRAMUSCULAR | Status: AC
  Filled 2016-02-11: qty 1

## 2016-02-11 MED ORDER — ROPIVACAINE HCL 7.5 MG/ML IJ SOLN
INTRAMUSCULAR | Status: AC
  Filled 2016-02-11: qty 20

## 2016-02-11 MED ORDER — BUPIVACAINE LIPOSOME 1.3 % IJ SUSP
20.0000 mL | Freq: Once | INTRAMUSCULAR | Status: DC
  Filled 2016-02-11: qty 20

## 2016-02-11 MED ORDER — METHOCARBAMOL 1000 MG/10ML IJ SOLN
500.0000 mg | Freq: Four times a day (QID) | INTRAVENOUS | Status: DC | PRN
  Administered 2016-02-11 – 2016-02-12 (×2): 500 mg via INTRAVENOUS
  Filled 2016-02-11 (×2): qty 5
  Filled 2016-02-11: qty 550

## 2016-02-11 MED ORDER — MEPERIDINE HCL 50 MG/ML IJ SOLN
6.2500 mg | INTRAMUSCULAR | Status: DC | PRN
  Administered 2016-02-11: 12.5 mg via INTRAVENOUS

## 2016-02-11 MED ORDER — ONDANSETRON HCL 4 MG PO TABS: 4.0000 mg | ORAL_TABLET | Freq: Four times a day (QID) | ORAL | Status: DC | PRN

## 2016-02-11 MED ORDER — MIDAZOLAM HCL 2 MG/2ML IJ SOLN
2.0000 mg | Freq: Once | INTRAMUSCULAR | Status: AC
  Administered 2016-02-11: 2 mg via INTRAVENOUS

## 2016-02-11 MED ORDER — CEFAZOLIN SODIUM-DEXTROSE 2-4 GM/100ML-% IV SOLN
2.0000 g | INTRAVENOUS | Status: AC
  Administered 2016-02-11: 2 g via INTRAVENOUS
  Filled 2016-02-11: qty 100

## 2016-02-11 MED ORDER — CHLORHEXIDINE GLUCONATE 4 % EX LIQD: 60.0000 mL | Freq: Once | CUTANEOUS | Status: DC

## 2016-02-11 MED ORDER — TRAMADOL HCL 50 MG PO TABS: 50.0000 mg | ORAL_TABLET | Freq: Four times a day (QID) | ORAL | Status: DC | PRN

## 2016-02-11 MED ORDER — FLEET ENEMA 7-19 GM/118ML RE ENEM: 1.0000 | ENEMA | Freq: Once | RECTAL | Status: DC | PRN

## 2016-02-11 MED ORDER — PANTOPRAZOLE SODIUM 40 MG PO TBEC: 40.0000 mg | DELAYED_RELEASE_TABLET | Freq: Two times a day (BID) | ORAL | Status: DC

## 2016-02-11 MED ORDER — HYDROMORPHONE HCL 1 MG/ML IJ SOLN: 0.2500 mg | INTRAMUSCULAR | Status: DC | PRN

## 2016-02-11 MED ORDER — DIPHENHYDRAMINE HCL 12.5 MG/5ML PO ELIX: 12.5000 mg | ORAL_SOLUTION | ORAL | Status: DC | PRN

## 2016-02-11 MED ORDER — FENTANYL CITRATE (PF) 100 MCG/2ML IJ SOLN
100.0000 ug | Freq: Once | INTRAMUSCULAR | Status: AC
  Administered 2016-02-11: 100 ug via INTRAVENOUS

## 2016-02-11 MED ORDER — CEFAZOLIN SODIUM-DEXTROSE 2-4 GM/100ML-% IV SOLN
2.0000 g | Freq: Four times a day (QID) | INTRAVENOUS | Status: AC
  Administered 2016-02-11 (×2): 2 g via INTRAVENOUS
  Filled 2016-02-11 (×2): qty 100

## 2016-02-11 MED ORDER — LACTATED RINGERS IV SOLN
INTRAVENOUS | Status: DC
  Administered 2016-02-11 (×2): via INTRAVENOUS

## 2016-02-11 MED ORDER — BUPIVACAINE HCL (PF) 0.25 % IJ SOLN
INTRAMUSCULAR | Status: AC
  Filled 2016-02-11: qty 30

## 2016-02-11 MED ORDER — MORPHINE SULFATE (PF) 2 MG/ML IV SOLN: 1.0000 mg | INTRAVENOUS | Status: DC | PRN

## 2016-02-11 SURGICAL SUPPLY — 64 items
ADAPTER BOLT FEMORAL +2/-2 (Knees) ×2 IMPLANT
AUGMENT DIST PFC 8MM SZ2 RT (Knees) ×2 IMPLANT
BAG DECANTER FOR FLEXI CONT (MISCELLANEOUS) ×2 IMPLANT
BAG ZIPLOCK 12X15 (MISCELLANEOUS) ×2 IMPLANT
BANDAGE ACE 6X5 VEL STRL LF (GAUZE/BANDAGES/DRESSINGS) ×2 IMPLANT
BLADE SAG 18X100X1.27 (BLADE) ×2 IMPLANT
BLADE SAW SGTL 11.0X1.19X90.0M (BLADE) ×2 IMPLANT
BONE CEMENT GENTAMICIN (Cement) ×6 IMPLANT
CEMENT BONE GENTAMICIN 40 (Cement) ×3 IMPLANT
CEMENT RESTRICTOR DEPUY SZ 4 (Cement) ×2 IMPLANT
CLOTH BEACON ORANGE TIMEOUT ST (SAFETY) ×2 IMPLANT
COMP FEM CEM RT SZ3 (Orthopedic Implant) ×2 IMPLANT
COMPONENT FEM CEM RT SZ3 (Orthopedic Implant) ×1 IMPLANT
CUFF TOURN SGL QUICK 34 (TOURNIQUET CUFF) ×1
CUFF TRNQT CYL 34X4X40X1 (TOURNIQUET CUFF) ×1 IMPLANT
DISAL AUG PFC 8MM SZ2 RT (Knees) ×4 IMPLANT
DRAPE U-SHAPE 47X51 STRL (DRAPES) ×2 IMPLANT
DRSG ADAPTIC 3X8 NADH LF (GAUZE/BANDAGES/DRESSINGS) ×2 IMPLANT
DRSG PAD ABDOMINAL 8X10 ST (GAUZE/BANDAGES/DRESSINGS) ×2 IMPLANT
DURAPREP 26ML APPLICATOR (WOUND CARE) ×2 IMPLANT
ELECT REM PT RETURN 9FT ADLT (ELECTROSURGICAL) ×2
ELECTRODE REM PT RTRN 9FT ADLT (ELECTROSURGICAL) ×1 IMPLANT
EVACUATOR 1/8 PVC DRAIN (DRAIN) ×2 IMPLANT
FEMORAL ADAPTER (Orthopedic Implant) ×2 IMPLANT
GAUZE SPONGE 4X4 12PLY STRL (GAUZE/BANDAGES/DRESSINGS) ×2 IMPLANT
GLOVE BIO SURGEON STRL SZ7.5 (GLOVE) ×2 IMPLANT
GLOVE BIO SURGEON STRL SZ8 (GLOVE) ×4 IMPLANT
GLOVE BIOGEL PI IND STRL 6.5 (GLOVE) ×3 IMPLANT
GLOVE BIOGEL PI IND STRL 7.5 (GLOVE) ×1 IMPLANT
GLOVE BIOGEL PI IND STRL 8 (GLOVE) ×2 IMPLANT
GLOVE BIOGEL PI INDICATOR 6.5 (GLOVE) ×3
GLOVE BIOGEL PI INDICATOR 7.5 (GLOVE) ×1
GLOVE BIOGEL PI INDICATOR 8 (GLOVE) ×2
GLOVE SURG SS PI 6.5 STRL IVOR (GLOVE) ×2 IMPLANT
GOWN STRL REUS W/TWL LRG LVL3 (GOWN DISPOSABLE) ×2 IMPLANT
GOWN STRL REUS W/TWL XL LVL3 (GOWN DISPOSABLE) ×2 IMPLANT
HANDPIECE INTERPULSE COAX TIP (DISPOSABLE) ×1
IMMOBILIZER KNEE 20 (SOFTGOODS) ×2
IMMOBILIZER KNEE 20 THIGH 36 (SOFTGOODS) ×1 IMPLANT
INSERT TC3 RP TIBIAL SZ 3.0 (Knees) ×2 IMPLANT
MANIFOLD NEPTUNE II (INSTRUMENTS) ×2 IMPLANT
NS IRRIG 1000ML POUR BTL (IV SOLUTION) ×2 IMPLANT
PACK TOTAL KNEE CUSTOM (KITS) ×2 IMPLANT
PADDING CAST COTTON 6X4 STRL (CAST SUPPLIES) ×4 IMPLANT
POSITIONER SURGICAL ARM (MISCELLANEOUS) ×2 IMPLANT
POST AVE PFC 4MM (Knees) ×4 IMPLANT
SET HNDPC FAN SPRY TIP SCT (DISPOSABLE) ×1 IMPLANT
STAPLER VISISTAT 35W (STAPLE) ×2 IMPLANT
STEM TIBIA PFC 13X30MM (Stem) ×2 IMPLANT
STEM UNIVERSAL REVISION 75X18 (Stem) ×2 IMPLANT
SUT VIC AB 2-0 CT1 27 (SUTURE) ×3
SUT VIC AB 2-0 CT1 TAPERPNT 27 (SUTURE) ×3 IMPLANT
SUT VLOC 180 0 24IN GS25 (SUTURE) ×2 IMPLANT
SWAB COLLECTION DEVICE MRSA (MISCELLANEOUS) IMPLANT
SWAB CULTURE ESWAB REG 1ML (MISCELLANEOUS) IMPLANT
SYR 50ML LL SCALE MARK (SYRINGE) ×4 IMPLANT
TOWER CARTRIDGE SMART MIX (DISPOSABLE) ×2 IMPLANT
TRAY FOLEY W/METER SILVER 16FR (SET/KITS/TRAYS/PACK) ×2 IMPLANT
TRAY REVISION SZ 2.5 (Knees) ×2 IMPLANT
TRAY SLEEVE CEM ML (Knees) ×2 IMPLANT
TUBE KAMVAC SUCTION (TUBING) IMPLANT
WATER STERILE IRR 1500ML POUR (IV SOLUTION) ×2 IMPLANT
WEDGE MBT STEP 2.5 15 MM (Knees) ×4 IMPLANT
WRAP KNEE MAXI GEL POST OP (GAUZE/BANDAGES/DRESSINGS) ×2 IMPLANT

## 2016-02-11 NOTE — Progress Notes (Signed)
Patient arrives to 6-east at 1600.  No complaints of pain at arrival.  Mother and husband in room with patient.

## 2016-02-11 NOTE — Anesthesia Procedure Notes (Signed)
Spinal  Patient location during procedure: OR Start time: 02/11/2016 11:10 AM End time: 02/11/2016 11:13 AM Staffing Anesthesiologist: Lillia Abed Performed: anesthesiologist  Preanesthetic Checklist Completed: patient identified, surgical consent, pre-op evaluation, timeout performed, IV checked, risks and benefits discussed and monitors and equipment checked Spinal Block Patient position: sitting Prep: Betadine Patient monitoring: heart rate, cardiac monitor, continuous pulse ox and blood pressure Approach: right paramedian Location: L3-4 Injection technique: single-shot Needle Needle type: Pencan  Needle gauge: 24 G Needle length: 9 cm Needle insertion depth: 8 cm

## 2016-02-11 NOTE — Progress Notes (Signed)
AssistedDr. Ossey with right, ultrasound guided, adductor canal block. Side rails up, monitors on throughout procedure. See vital signs in flow sheet. Tolerated Procedure well.  

## 2016-02-11 NOTE — Anesthesia Procedure Notes (Signed)
Spinal  Start time: 02/11/2016 11:07 AM End time: 02/11/2016 11:11 AM Staffing Anesthesiologist: Lillia Abed Resident/CRNA: Chandra Batch A Preanesthetic Checklist Completed: patient identified, site marked, surgical consent, pre-op evaluation, timeout performed, IV checked, risks and benefits discussed and monitors and equipment checked Spinal Block Patient position: sitting Prep: DuraPrep Patient monitoring: heart rate, cardiac monitor, continuous pulse ox and blood pressure Approach: right paramedian Location: L2-3 Injection technique: single-shot Needle Needle type: Sprotte  Needle gauge: 25 G Needle length: 9 cm Needle insertion depth: 6 cm Assessment Sensory level: T8

## 2016-02-11 NOTE — Brief Op Note (Signed)
02/11/2016  1:06 PM  PATIENT:  Melven Sartorius  61 y.o. female  PRE-OPERATIVE DIAGNOSIS:  LOOSE RIGHT TOTAL KNEE ARTHROPLASTY  POST-OPERATIVE DIAGNOSIS:  LOOSE RIGHT TOTAL KNEE ARTHROPLASTY  PROCEDURE:  Procedure(s): RIGHT TOTAL KNEE ARTHROPLASTY REVISION (Right)  SURGEON:  Surgeon(s) and Role:    * Gaynelle Arabian, MD - Primary  PHYSICIAN ASSISTANT:   ASSISTANTS: Arlee Muslim, [name]   ANESTHESIA:   Adductor canal block and spinal  EBL:  Total I/O In: 1000 [I.V.:1000] Out: 1000 [Urine:900; Blood:100]  BLOOD ADMINISTERED:none  DRAINS: (Medium) Hemovact drain(s) in the right knee with  Suction Open   LOCAL MEDICATIONS USED:  OTHER Exparel  COUNTS:  YES  TOURNIQUET:   Total Tourniquet Time Documented: Thigh (Right) - 53 minutes Thigh (Right) - 24 minutes Total: Thigh (Right) - 77 minutes   DICTATION: .Other Dictation: Dictation Number [id number]  PLAN OF CARE: Admit to inpatient   PATIENT DISPOSITION:  PACU - hemodynamically stable.   3

## 2016-02-11 NOTE — Transfer of Care (Signed)
Immediate Anesthesia Transfer of Care Note  Patient: Leslie Koch  Procedure(s) Performed: Procedure(s): RIGHT TOTAL KNEE ARTHROPLASTY REVISION (Right)  Patient Location: PACU  Anesthesia Type:Regional and Spinal  Level of Consciousness: sedated  Airway & Oxygen Therapy: Patient Spontanous Breathing and Patient connected to face mask oxygen  Post-op Assessment: Report given to RN and Post -op Vital signs reviewed and stable  Post vital signs: Reviewed and stable  Last Vitals:  Vitals:   02/11/16 1046 02/11/16 1047  BP:    Pulse: 76 74  Resp: 13 12  Temp:      Last Pain:  Vitals:   02/11/16 0829  TempSrc:   PainSc: 7       Patients Stated Pain Goal: 4 ([phone] [id number])  Complications: No apparent anesthesia complications

## 2016-02-11 NOTE — Anesthesia Procedure Notes (Signed)
Anesthesia Regional Block:  Adductor canal block  Pre-Anesthetic Checklist: ,, timeout performed, Correct Patient, Correct Site, Correct Laterality, Correct Procedure, Correct Position, site marked, Risks and benefits discussed,  Surgical consent,  Pre-op evaluation,  At surgeon's request and post-op pain management  Laterality: Right  Prep: chloraprep       Needles:  Injection technique: Single-shot  Needle Type: Echogenic Stimulator Needle     Needle Length: 9cm 9 cm Needle Gauge: 21 and 21 G    Additional Needles:  Procedures: ultrasound guided (picture in chart) Adductor canal block Narrative:  Start time: 02/11/2016 10:10 AM End time: 02/11/2016 10:20 AM Injection made incrementally with aspirations every 5 mL.  Performed by: Personally  Anesthesiologist: Lillia Abed  Additional Notes: Monitors applied. Patient sedated. Sterile prep and drape,hand hygiene and sterile gloves were used. Relevant anatomy identified.Needle position confirmed.Local anesthetic injected incrementally after negative aspiration. Local anesthetic spread visualized around nerve(s). Vascular puncture avoided. No complications. Image printed for medical record.The patient tolerated the procedure well.    Lillia Abed MD

## 2016-02-11 NOTE — Anesthesia Procedure Notes (Signed)
Spinal

## 2016-02-11 NOTE — Interval H&P Note (Signed)
History and Physical Interval Note:  02/11/2016 9:58 AM  Leslie Koch  has presented today for surgery, with the diagnosis of LOOSE RIGHT TOTAL KNEE ARTHROPLASTY  The various methods of treatment have been discussed with the patient and family. After consideration of risks, benefits and other options for treatment, the patient has consented to  Procedure(s): RIGHT TOTAL KNEE ARTHROPLASTY REVISION (Right) as a surgical intervention .  The patient's history has been reviewed, patient examined, no change in status, stable for surgery.  I have reviewed the patient's chart and labs.  Questions were answered to the patient's satisfaction.     Gearlean Alf

## 2016-02-11 NOTE — Anesthesia Preprocedure Evaluation (Signed)
Anesthesia Evaluation  Patient identified by MRN, date of birth, ID band Patient awake    Reviewed: Allergy & Precautions  History of Anesthesia Complications (+) PONV  Airway Mallampati: I  TM Distance: >3 FB Neck ROM: Full    Dental   Pulmonary former smoker,    Pulmonary exam normal        Cardiovascular hypertension, Pt. on medications Normal cardiovascular exam     Neuro/Psych    GI/Hepatic GERD  Medicated and Controlled,  Endo/Other    Renal/GU      Musculoskeletal   Abdominal   Peds  Hematology   Anesthesia Other Findings   Reproductive/Obstetrics                             Anesthesia Physical Anesthesia Plan  ASA: II  Anesthesia Plan: Spinal and MAC   Post-op Pain Management:  Regional for Post-op pain   Induction: Intravenous  Airway Management Planned:   Additional Equipment:   Intra-op Plan:   Post-operative Plan:   Informed Consent: I have reviewed the patients History and Physical, chart, labs and discussed the procedure including the risks, benefits and alternatives for the proposed anesthesia with the patient or authorized representative who has indicated his/her understanding and acceptance.     Plan Discussed with: CRNA and Surgeon  Anesthesia Plan Comments:         Anesthesia Quick Evaluation

## 2016-02-11 NOTE — H&P (View-Only) (Signed)
Leslie Koch DOB: September 23, 1955 Married / Language: English / Race: White Female Date of Admission:  02/11/2016 CC:  Loose right total knee History of Present Illness  The patient is a 61 year old female who comes in  for a preoperative History and Physical. The patient is scheduled for a right total knee arthroplasty (revision) to be performed by Dr. Dione Plover. Aluisio, MD at [facility] on 02-11-2016. The patient is a 61 year old female who presented with knee complaints. The patient was seen for a second opinion. The patient reports right knee symptoms including: pain, swelling, instability and soreness (mostly on the lateral aspect of her lower leg) which began year(s) ago following a specific injury (meniscal tear while doing Zumba in 2012. She had a knee scope, which eventually led to a knee replacement).The patient feels that the symptoms are unchanging (pain is better or worse depending on activity. She does a lot of walking at work, and her knee bothers her more on the days she has to work). Past treatment for this problem has included opioid analgesics and physical therapy. Current treatment includes opioid analgesics (tramadol, prn). Risk factors include total knee replacement (November 2014; with revision on 11/27/13). Unfortunately, her right knee is persistently painful. She has a lot of pretibial pain which goes from just below the joint line down to the mid aspect of the tibia along the anterior and lateral aspect of the leg. It is worse with weightbearing with some discomfort at rest. The knee is stable. She does not get swelling. She is not having any hip pain on that side. She had two operations. The first one being a primary total knee November 2014 which was noted to be loose and was subsequently revised in November 2015 to a long tibial stem. She had persistent tibial pain since. Dr. Rudene Christians had recommended another revision and she wanted to come get a second opinion. She states that  the knee is definitely having a negative effect on her lifestyle. She is unable to do a lot of which she desires. She also has a lot of pain in her left knee which has not been operated on. She feels like the right knee is currently more symptomatic than the left and wants to proceed with revision surgery at this time. They have been treated conservatively in the past for the above stated problem and despite conservative measures, they continue to have progressive pain and severe functional limitations and dysfunction. They have failed non-operative management including home exercise, medications, and subsequent revision procedure. It is felt that they would benefit from undergoing revision of the total joint replacement. Risks and benefits of the procedure have been discussed with the patient and they elect to proceed with surgery. There are no active contraindications to surgery such as ongoing infection or rapidly progressive neurological disease.   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 Rudene Christians Gastroesophageal Reflux Disease  High blood pressure  Hypercholesterolemia  Hemorrhoids  Varicose veins  Menopause   Allergies Singulair *ANTIASTHMATIC AND BRONCHODILATOR AGENTS*  Rash.  Family History Bleeding disorder  Mother. Cancer  Father. Diabetes Mellitus  Mother. First Degree Relatives  reported Heart Disease  Maternal Grandfather, Mother. Heart disease in female family member before age 25  Hypertension  Mother. Osteoarthritis  Mother. Rheumatoid Arthritis  Maternal Grandmother.  Social History Not  under pain contract  No history of drug/alcohol rehab  Number of flights of stairs before winded  2-3 Tobacco use  Former smoker. 12/28/2013: smoke(d) 2 pack(s) per day Tobacco  / smoke exposure  12/28/2013: yes outdoors only Marital status  married Current drinker  12/28/2013: Currently drinks hard liquor only occasionally per week Children  2 Current work status  working full time Living situation  live with spouse Exercise  Exercises rarely; does running / walking  Medication History  Vitamin D3 (1000UNIT Tablet, Oral two times daily) Active. Calcium (500MG  Capsule, 2 Oral two times daily) Active. Aspirin (81MG  Tablet, Oral) Active. Multivitamin (Oral) Active. TraMADol HCl (50MG  Tablet, Oral as needed) Active. ASA Arthritis Strength/Antacid (Oral) Specific strength unknown - Active. ZyrTEC Allergy (Oral) Specific strength unknown - Active. Omeprazole Magnesium (20MG  Tablet DR, Oral two times daily) Active. Lisinopril-Hydrochlorothiazide (20-12.5MG  Tablet, Oral) Active. Premarin (0.45MG  Tablet, Oral) Active. CeleBREX (200MG  Capsule, Oral) Active. Fish Oil (two times daily) Active. Glucosamine Chondroit-Collagen (Oral two times daily) Active. Simvastatin (40MG  Tablet, Oral at bedtime) Active.  Past Surgical History Arthroscopy of Knee  Date: 2012. right Hysterectomy  Date: 01/1999. partial (non-cancerous) Tubal Ligation  Date: 01/1983. Total Knee Replacement - Right  Date: 2014. Revision Right Total Knee  Date: 11/2013.   Review of Systems General Present- Fatigue and Night Sweats. Not Present- Chills, Fever, Memory Loss, Weight Gain and Weight Loss. Skin Not Present- Eczema, Hives, Itching, Lesions and Rash. HEENT Not Present- Dentures, Double Vision, Headache, Hearing Loss, Tinnitus and Visual Loss. Respiratory Not Present- Allergies, Chronic Cough, Coughing up blood, Shortness of breath at rest and Shortness of breath with exertion. Cardiovascular Not Present- Chest Pain, Difficulty Breathing Lying Down, Murmur, Palpitations, Racing/skipping heartbeats and Swelling. Gastrointestinal Not Present- Abdominal Pain, Bloody Stool,  Constipation, Diarrhea, Difficulty Swallowing, Heartburn, Jaundice, Loss of appetitie, Nausea and Vomiting. Female Genitourinary Not Present- Blood in Urine, Discharge, Flank Pain, Incontinence, Painful Urination, Urgency, Urinary frequency, Urinary Retention, Urinating at Night and Weak urinary stream. Musculoskeletal Present- Back Pain, Joint Pain, Joint Swelling, Leg Cramps, Morning Stiffness and Spasms. Not Present- Muscle Pain and Muscle Weakness. Neurological Not Present- Blackout spells, Difficulty with balance, Dizziness, Paralysis, Tremor and Weakness. Psychiatric Not Present- Insomnia.  Vitals Height: 62in Height was reported by patient. Pulse: 76 (Regular)  BP: 142/92 (Sitting, Right Arm, Standard)   Physical Exam  General Mental Status -Alert, cooperative and good historian. General Appearance-pleasant, Not in acute distress. Orientation-Oriented X3. Build & Nutrition-Well nourished and Well developed.  Head and Neck Head-normocephalic, atraumatic . Neck Global Assessment - supple, no bruit auscultated on the right, no bruit auscultated on the left.  Eye Vision-Wears corrective lenses. Pupil - Bilateral-Regular and Round. Motion - Bilateral-EOMI.  ENMT Note: upper and partial lower denture   Chest and Lung Exam Auscultation Breath sounds - clear at anterior chest wall and clear at posterior chest wall. Adventitious sounds - No Adventitious sounds.  Cardiovascular Auscultation Rhythm - Regular rate and rhythm. Heart Sounds - S1 WNL and S2 WNL. Murmurs & Other Heart Sounds - Auscultation of the heart reveals - No Murmurs.  Abdomen Palpation/Percussion Tenderness - Abdomen is non-tender to palpation. Rigidity (guarding) - Abdomen is soft. Auscultation Auscultation of the abdomen reveals - Bowel sounds normal.  Female Genitourinary Note: Not done, not pertinent to present illness   Musculoskeletal Note: On exam, very pleasant,  well-developed female, alert and oriented, in no apparent distress. Evaluation of her hips showed normal range of motion with no discomfort. Evaluation of her left  knee shows no effusion. Her range is about 5 to 130. There is moderate crepitus on range of motion. She is tender medially with no lateral tenderness or instability noted. Pulses, sensation and motor are intact. Right knee, well-healed scar anteriorly. There is no warmth or erythema about the knee. There is no effusion. Range of about 0 to 110. There is no instability noted about the knee. She is tender in the pretibial area all way down to about the mid tibia.  RADIOGRAPHS AP both knees and lateral showed on the left she has bone-on-bone arthritis in the medial compartment with patellofemoral compartment involvement also. Right knee shows a total knee arthroplasty with a long tibial stem which appears non-cemented which is abutting the lateral cortex of the tibia with slight stress reaction and with neocortex around the periphery of the stem. It is consistent with possible loosening of the stem.   Assessment & Plan  Status post total right knee replacement [id number]) Story: revision 11/27/13; Dr. Rudene Christians  Note:Surgical Plans: Revision Right Total Knee  Disposition: Home  PCP: Dr. Caryn Section  IV TXA  Anesthesia Issues: None  Patient was instructed on what medications to stop prior to surgery.  Signed electronically by Ok Edwards, III [name]

## 2016-02-11 NOTE — Anesthesia Postprocedure Evaluation (Signed)
Anesthesia Post Note  Patient: Leslie Koch  Procedure(s) Performed: Procedure(s) (LRB): RIGHT TOTAL KNEE ARTHROPLASTY REVISION (Right)  Patient location during evaluation: PACU Anesthesia Type: MAC and Spinal Level of consciousness: awake and alert Pain management: pain level controlled Vital Signs Assessment: post-procedure vital signs reviewed and stable Respiratory status: spontaneous breathing, nonlabored ventilation, respiratory function stable and patient connected to nasal cannula oxygen Cardiovascular status: blood pressure returned to baseline and stable Postop Assessment: no signs of nausea or vomiting Anesthetic complications: no       Last Vitals:  Vitals:   02/11/16 1415 02/11/16 1430  BP: (!) 145/80 136/89  Pulse: 70 71  Resp: 12 14  Temp: 36.3 C     Last Pain:  Vitals:   02/11/16 1430  TempSrc:   PainSc: [name] DAVID

## 2016-02-11 NOTE — Progress Notes (Signed)
Patient has bruise to left arm from pre-admission blood work. Patient has bruises to left and right lower extremities due to broken veins per patient.

## 2016-02-12 ENCOUNTER — Encounter (HOSPITAL_COMMUNITY): Payer: Self-pay | Admitting: Orthopedic Surgery

## 2016-02-12 LAB — BASIC METABOLIC PANEL
Anion gap: 6 (ref 5–15)
BUN: 7 mg/dL (ref 6–20)
CHLORIDE: 106 mmol/L (ref 101–111)
CO2: 29 mmol/L (ref 22–32)
Calcium: 8.5 mg/dL — ABNORMAL LOW (ref 8.9–10.3)
Creatinine, Ser: 0.67 mg/dL (ref 0.44–1.00)
GFR calc Af Amer: >60 mL/min (ref >60)
GFR calc non Af Amer: >60 mL/min (ref >60)
Glucose, Bld: 129 mg/dL — ABNORMAL HIGH (ref 65–99)
POTASSIUM: 4.2 mmol/L (ref 3.5–5.1)
SODIUM: 141 mmol/L (ref 135–145)

## 2016-02-12 LAB — CBC
HCT: 35.2 % — ABNORMAL LOW (ref 36.0–46.0)
HEMOGLOBIN: 11.7 g/dL — AB (ref 12.0–15.0)
MCH: 29.6 pg (ref 26.0–34.0)
MCHC: 33.2 g/dL (ref 30.0–36.0)
MCV: 89.1 fL (ref 78.0–100.0)
Platelets: 252 10*3/uL (ref 150–400)
RBC: 3.95 MIL/uL (ref 3.87–5.11)
RDW: 13.1 % (ref 11.5–15.5)
WBC: 13.1 10*3/uL — ABNORMAL HIGH (ref 4.0–10.5)

## 2016-02-12 MED ORDER — RANITIDINE HCL 150 MG PO TABS
150.0000 mg | ORAL_TABLET | Freq: Two times a day (BID) | ORAL | Status: DC
  Administered 2016-02-12 – 2016-02-13 (×2): 150 mg via ORAL
  Filled 2016-02-12 (×3): qty 1

## 2016-02-12 MED ORDER — RANITIDINE HCL 150 MG PO TABS: 150.0000 mg | ORAL_TABLET | Freq: Two times a day (BID) | ORAL | Status: DC

## 2016-02-12 MED ORDER — GABAPENTIN 300 MG PO CAPS
300.0000 mg | ORAL_CAPSULE | Freq: Three times a day (TID) | ORAL | Status: DC
  Administered 2016-02-12 – 2016-02-13 (×4): 300 mg via ORAL
  Filled 2016-02-12 (×4): qty 1

## 2016-02-12 MED ORDER — SODIUM CHLORIDE 0.9 % IV BOLUS (SEPSIS)
250.0000 mL | Freq: Once | INTRAVENOUS | Status: AC
  Administered 2016-02-12: 250 mL via INTRAVENOUS

## 2016-02-12 MED ORDER — OMEPRAZOLE 20 MG PO CPDR
20.0000 mg | DELAYED_RELEASE_CAPSULE | Freq: Two times a day (BID) | ORAL | Status: DC
  Administered 2016-02-12 – 2016-02-13 (×3): 20 mg via ORAL
  Filled 2016-02-12 (×3): qty 1

## 2016-02-12 NOTE — Op Note (Signed)
Leslie Koch, Leslie Koch NO.:  [id number]  MEDICAL RECORD NO.:  [id number]  LOCATION:                                 FACILITY:  PHYSICIAN:  Gaynelle Arabian, M.D.         DATE OF BIRTH:  DATE OF PROCEDURE:  02/11/2016 DATE OF DISCHARGE:                              OPERATIVE REPORT   PREOPERATIVE DIAGNOSIS:  Painful loose right total knee arthroplasty.  POSTOPERATIVE DIAGNOSIS:  Painful loose right total knee arthroplasty.  PROCEDURE:  Right total knee arthroplasty revision.  SURGEON:  Gaynelle Arabian, M.D.  ASSISTANT:  Alexzandrew L. Perkins, P.A.C.  ANESTHESIA:  Adductor canal block and spinal.  DRAIN:  Hemovac x1.  TOURNIQUET TIME:  Up 52 minutes at 300 mmHg.  Down 8 minutes, up 24 minutes at 300 mmHg.  COMPLICATIONS:  None.  CONDITION:  Stable to recovery.  BRIEF CLINICAL NOTE:  Samsara is a 61 year old female with complex history in regard to her right knee.  She has had 2 right knee total knee arthroplasties done in [place].  She presents with significant right pretibial pain.  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  It appears that the tibia may be loose.  She has significant pain and dysfunction. She presents now for a redo revision total knee arthroplasty.  PROCEDURE IN DETAIL:  After successful administration of adductor canal block and spinal, a tourniquet was placed high on her right thigh and right lower extremity was prepped and draped in the usual sterile fashion.  Extremities wrapped in Esmarch, knee flexed, and tourniquet inflated to 300 mmHg.  A midline incision was made with a 10 blade through the subcutaneous tissue to the level of the extensor mechanism. A fresh blade was used to make a medial parapatellar arthrotomy.  The soft tissue over the proximal medial tibia subperiosteally elevated to the joint line with the knife and into the semimembranosus bursa with a Cobb elevator.  Soft tissue laterally was elevated with attention  being paid to avoiding the patellar tendon on tibial tubercle.  Patella was everted, knee flexed to 90 degrees.  I was able to dislocate the tibia from the femur.  The tibial polyethylene was screwed into the tibial components locking in place.  I felt we could just extract it as 1 piece the tibial component and the tibial poly.  I exposed the junction of the component and bone.  Oscillating saw was used to disrupt this interface and then I was able to use a bone tamp to remove the entire tibial component and long-stem without any bone loss.  It was essentially loose to begin with.  I removed any cement there was from the proximal tibia. The tibia was then subluxed forward and circumferential retraction achieved.  The extramedullary tibial alignment guide was placed referencing proximally at the medial aspect of the tibial tubercle and distally along the second metatarsal axis and tibial crest.  Block was pinned to remove about 2 mm off the previously cut bone surface to get down to a fresh bone surface and to get it level at the appropriate alignment.  Size 2.5 was the most appropriate tibial component.  The proximal tibia was prepared to modular drill and then modular drill plus 13 x 30 stem.  I then broached for a 29-mm sleeve.  The thickness of the augment that was removed from the patient's previous implant was 10 mm. I placed 15-mm trial augments onto the 2.5 M.B.T. revision trial with the 29 sleeve and 13 x 30 stem extension.  It was impacted into the tibia with excellent purchase and excellent fit.  We then prepared the femur.  The junction between the femoral component and bone was disrupted utilizing osteotomes and the femoral components removed with minimal to no bone loss.  A drill was used to create the starting hole in the distal femur.  I thoroughly irrigated the canal and then reamed up to 18 mm to get an excellent press-fit with the reamer. The reamer was then used as  our intramedullary cutting guide.  The distal femoral cutting block was placed and I skimmed about a millimeter off each side.  I felt that the joint line was too high so I added 8 mm augments distally both medial and lateral to get the joint line back to the appropriate height.  The size 3 was the most appropriate femoral component.  We placed a size 3 cutting block, rotators off the epicondylar axis, and confirmed by creating a rectangular flexion gap at 90 degrees with the spacer blocks in place.  Block was pinned in this rotation in a +2 position.  We essentially had minimal bone resection anterior or chamfers.  I did resect some bone posterior to allow for 4 mm posterior augments.  The intercondylar block was then placed and the intercondylar cut made for the TC3 component.  On the femoral side, the trial was a size 3 TC3 femur with an 18 x 75 stem and a +2 position and 5 degrees of valgus with an 8-mm augment distally both medial and lateral and 4-mm augment posteriorly both medial and lateral.  This trial had excellent fit on the femur.  With a 15-mm insert, there was quite a bit of hyperextension, so went to 17.5 insert which allowed for full extension with excellent varus-valgus and anterior-posterior balance throughout full range of motion.  The patella was in good shape and I did a patelloplasty removing the tissue around it.  When I did this, the patella then centered nicely in the trochlea and tracked normally.  The tourniquet was then released for 8 minutes while the components were assembled on the back table.  Minor bleeding was stopped with cautery.  After 8 minutes, I re-wrapped the leg in Esmarch.  The trials were removed.  Then, we trialed for the cement restrictor.  Size 4 was the appropriate size and a size 4 cement restrictor placed to the appropriate depth in the tibial canal.  The cut bony surfaces were then thoroughly prepared with pulsatile lavage.  Cements mixed  and once ready for implantation, we injected the cement into the tibia and on the cut bone surface.  The tibial component was cemented first.  This was the size 2.5 M.B.T. revision tray with 15 mm augments medial and lateral, a 29 sleeve, and a 13 x 30 stem extension.  It was impacted into place and all extruded cement removed.  On the femoral side, we cemented distally with a press-fit stem.  The sizes of TC3 femur with 18 x 75 stem and a +2 position in 5 degrees of valgus.  There was a distal augmentation 8 mm medial  and lateral and 4 mm posterior medial and lateral.  This was impacted into place and cemented and extruded cement removed.  A 17.5 trial insert was placed and the knee held in full extension.  There was excellent stability throughout full range of motion with a 17.5.  When the cement was fully hardened, then the permanent 17.5 mm TC3 rotating platform insert was placed in a tibial tray.  Wound was copiously irrigated with saline solution, and then the arthrotomy closed over Hemovac drain with a running #1 Stratafix suture.  Flexion against gravity to 130 degrees.  Patella tracked normally.  Tourniquet was released with the second tourniquet time of 24 minutes.  The subcu was then closed with interrupted 2-0 Vicryl and the skin closed with staples.  The incision was cleaned and dried and a bulky sterile dressing applied.  The drains were hooked to suction.  She was placed into a knee immobilizer, awakened and transported to recovery in stable condition.  Please note that Exparel was injected into the extensor mechanism and subcu tissues prior to closure.  Also note that a surgical assistant was a medical necessity for this procedure for retraction of vital ligaments and neurovascular structures and for proper positioning of the limb for safe removal of the old prosthesis and safe and proper alignment and placement of the new prosthesis.     Gaynelle Arabian,  M.D.     FA/MEDQ  D:  02/11/2016  T:  02/12/2016  Job:  [id number]

## 2016-02-12 NOTE — Evaluation (Signed)
Physical Therapy Evaluation Patient Details Name: Leslie Koch MRN: [id number] DOB: Sep 07, 1955 Today's Date: 02/12/2016   History of Present Illness  Right total knee arthroplasty revision  Clinical Impression  The patient is tolerating ambulation well. Pleased with surgery and pain less. Pt admitted with above diagnosis. Pt currently with functional limitations due to the deficits listed below (see PT Problem List).  Pt will benefit from skilled PT to increase their independence and safety with mobility to allow discharge to the venue listed below.       Follow Up Recommendations Home health PT;Supervision/Assistance - 24 hour    Equipment Recommendations  None recommended by PT    Recommendations for Other Services       Precautions / Restrictions Precautions Precautions: Knee Precaution Comments: did not wear KI Required Braces or Orthoses: Knee Immobilizer - Right Knee Immobilizer - Right: Discontinue once straight leg raise with < 10 degree lag Restrictions Weight Bearing Restrictions: No      Mobility  Bed Mobility Overal bed mobility: Modified Independent                Transfers Overall transfer level: Needs assistance Equipment used: Rolling walker (2 wheeled) Transfers: Sit to/from Stand Sit to Stand: Supervision            Ambulation/Gait Ambulation/Gait assistance: Supervision Ambulation Distance (Feet): 125 Feet Assistive device: Rolling walker (2 wheeled) Gait Pattern/deviations: Step-to pattern;Step-through pattern;Antalgic     General Gait Details: cues for sequence   Stairs            Wheelchair Mobility    Modified Rankin (Stroke Patients Only)       Balance                                             Pertinent Vitals/Pain Pain Assessment: 0-10 Pain Score: 5  Pain Location: R knee Pain Descriptors / Indicators: Aching;Sore Pain Intervention(s): Premedicated before session;Monitored during  session;Ice applied;Repositioned    Home Living Family/patient expects to be discharged to:: Private residence Living Arrangements: Spouse/significant other Available Help at Discharge: Family Type of Home: House Home Access: Stairs to enter     [place]: One level        Prior Function Level of Independence: Independent               Hand Dominance   Dominant Hand: Right    Extremity/Trunk Assessment   Upper Extremity Assessment Upper Extremity Assessment: Overall WFL for tasks assessed    Lower Extremity Assessment Lower Extremity Assessment: RLE deficits/detail RLE Deficits / Details: knee flexion 10-45    Cervical / Trunk Assessment Cervical / Trunk Assessment: Normal  Communication   Communication: No difficulties  Cognition Arousal/Alertness: Awake/alert Behavior During Therapy: WFL for tasks assessed/performed Overall Cognitive Status: Within Functional Limits for tasks assessed                      General Comments      Exercises     Assessment/Plan    PT Assessment Patient needs continued PT services  PT Problem List Decreased strength;Decreased range of motion;Decreased activity tolerance;Decreased mobility;Decreased knowledge of precautions;Decreased safety awareness;Decreased knowledge of use of DME;Pain          PT Treatment Interventions DME instruction;Gait training;Stair training;Functional mobility training;Therapeutic activities;Therapeutic exercise;Patient/family education    PT Goals (Current goals can be  found in the Care Plan section)  Acute Rehab PT Goals Patient Stated Goal: to have no pain PT Goal Formulation: With patient Time For Goal Achievement: 02/15/16 Potential to Achieve Goals: Good    Frequency 7X/week   Barriers to discharge        Co-evaluation               End of Session   Activity Tolerance: Patient tolerated treatment well Patient left: in bed;with call bell/phone within  reach;with family/visitor present Nurse Communication: Mobility status         Time: [id number] PT Time Calculation (min) (ACUTE ONLY): 30 min   Charges:   PT Evaluation $PT Eval Low Complexity: 1 Procedure PT Treatments $Gait Training: 8-22 mins   PT G Codes:        Claretha Cooper 02/12/2016, 1:10 PM  Tresa Endo PT [phone]

## 2016-02-12 NOTE — Discharge Instructions (Addendum)
° °Dr. Frank Aluisio °Total Joint Specialist °[facility] °[address] °[place], [place] [address] °[phone] ° °TOTAL KNEE REPLACEMENT POSTOPERATIVE DIRECTIONS ° °Knee Rehabilitation, Guidelines Following Surgery  °Results after knee surgery are often greatly improved when you follow the exercise, range of motion and muscle strengthening exercises prescribed by your doctor. Safety measures are also important to protect the knee from further injury. Any time any of these exercises cause you to have increased pain or swelling in your knee joint, decrease the amount until you are comfortable again and slowly increase them. If you have problems or questions, call your caregiver or physical therapist for advice.  ° °HOME CARE INSTRUCTIONS  °Remove items at home which could result in a fall. This includes throw rugs or furniture in walking pathways.  °· ICE to the affected knee every three hours for 30 minutes at a time and then as needed for pain and swelling.  Continue to use ice on the knee for pain and swelling from surgery. You may notice swelling that will progress down to the foot and ankle.  This is normal after surgery.  Elevate the leg when you are not up walking on it.   °· Continue to use the breathing machine which will help keep your temperature down.  It is common for your temperature to cycle up and down following surgery, especially at night when you are not up moving around and exerting yourself.  The breathing machine keeps your lungs expanded and your temperature down. °· Do not place pillow under knee, focus on keeping the knee straight while resting ° °DIET °You may resume your previous home diet once your are discharged from the hospital. ° °DRESSING / WOUND CARE / SHOWERING °You may shower 3 days after surgery, but keep the wounds dry during showering.  You may use an occlusive plastic wrap (Press'n Seal for example), NO SOAKING/SUBMERGING IN THE BATHTUB.  If the  bandage gets wet, change with a clean dry gauze.  If the incision gets wet, pat the wound dry with a clean towel. °You may start showering once you are discharged home but do not submerge the incision under water. Just pat the incision dry and apply a dry gauze dressing on daily. °Change the surgical dressing daily and reapply a dry dressing each time. ° °ACTIVITY °Walk with your walker as instructed. °Use walker as long as suggested by your caregivers. °Avoid periods of inactivity such as sitting longer than an hour when not asleep. This helps prevent blood clots.  °You may resume a sexual relationship in one month or when given the OK by your doctor.  °You may return to work once you are cleared by your doctor.  °Do not drive a car for 6 weeks or until released by you surgeon.  °Do not drive while taking narcotics. ° °WEIGHT BEARING °Weight bearing as tolerated with assist device (walker, cane, etc) as directed, use it as long as suggested by your surgeon or therapist, typically at least 4-6 weeks. ° °POSTOPERATIVE CONSTIPATION PROTOCOL °Constipation - defined medically as fewer than three stools per week and severe constipation as less than one stool per week. ° °One of the most common issues patients have following surgery is constipation.  Even if you have a regular bowel pattern at home, your normal regimen is likely to be disrupted due to multiple reasons following surgery.  Combination of anesthesia, postoperative narcotics, change in appetite and fluid intake all can affect your bowels.    In order to avoid complications following surgery, here are some recommendations in order to help you during your recovery period. ° °Colace (docusate) - Pick up an over-the-counter form of Colace or another stool softener and take twice a day as long as you are requiring postoperative pain medications.  Take with a full glass of water daily.  If you experience loose stools or diarrhea, hold the colace until you stool forms  back up.  If your symptoms do not get better within 1 week or if they get worse, check with your doctor. ° °Dulcolax (bisacodyl) - Pick up over-the-counter and take as directed by the product packaging as needed to assist with the movement of your bowels.  Take with a full glass of water.  Use this product as needed if not relieved by Colace only.  ° °MiraLax (polyethylene glycol) - Pick up over-the-counter to have on hand.  MiraLax is a solution that will increase the amount of water in your bowels to assist with bowel movements.  Take as directed and can mix with a glass of water, juice, soda, coffee, or tea.  Take if you go more than two days without a movement. °Do not use MiraLax more than once per day. Call your doctor if you are still constipated or irregular after using this medication for 7 days in a row. ° °If you continue to have problems with postoperative constipation, please contact the office for further assistance and recommendations.  If you experience "the worst abdominal pain ever" or develop nausea or vomiting, please contact the office immediatly for further recommendations for treatment. ° °ITCHING ° If you experience itching with your medications, try taking only a single pain pill, or even half a pain pill at a time.  You can also use Benadryl over the counter for itching or also to help with sleep.  ° °TED HOSE STOCKINGS °Wear the elastic stockings on both legs for three weeks following surgery during the day but you may remove then at night for sleeping. ° °MEDICATIONS °See your medication summary on the “After Visit Summary” that the nursing staff will review with you prior to discharge.  You may have some home medications which will be placed on hold until you complete the course of blood thinner medication.  It is important for you to complete the blood thinner medication as prescribed by your surgeon.  Continue your approved medications as instructed at time of  discharge. ° °PRECAUTIONS °If you experience chest pain or shortness of breath - call 911 immediately for transfer to the hospital emergency department.  °If you develop a fever greater that 101 F, purulent drainage from wound, increased redness or drainage from wound, foul odor from the wound/dressing, or calf pain - CONTACT YOUR SURGEON.   °                                                °FOLLOW-UP APPOINTMENTS °Make sure you keep all of your appointments after your operation with your surgeon and caregivers. You should call the office at the above phone number and make an appointment for approximately two weeks after the date of your surgery or on the date instructed by your surgeon outlined in the "After Visit Summary". ° ° °RANGE OF MOTION AND STRENGTHENING EXERCISES  °Rehabilitation of the knee is important following a knee injury or   an operation. After just a few days of immobilization, the muscles of the thigh which control the knee become weakened and shrink (atrophy). Knee exercises are designed to build up the tone and strength of the thigh muscles and to improve knee motion. Often times heat used for twenty to thirty minutes before working out will loosen up your tissues and help with improving the range of motion but do not use heat for the first two weeks following surgery. These exercises can be done on a training (exercise) mat, on the floor, on a table or on a bed. Use what ever works the best and is most comfortable for you Knee exercises include:  Leg Lifts - While your knee is still immobilized in a splint or cast, you can do straight leg raises. Lift the leg to 60 degrees, hold for 3 sec, and slowly lower the leg. Repeat 10-20 times 2-3 times daily. Perform this exercise against resistance later as your knee gets better.  Quad and Hamstring Sets - Tighten up the muscle on the front of the thigh (Quad) and hold for 5-10 sec. Repeat this 10-20 times hourly. Hamstring sets are done by pushing the  foot backward against an object and holding for 5-10 sec. Repeat as with quad sets.   Leg Slides: Lying on your back, slowly slide your foot toward your buttocks, bending your knee up off the floor (only go as far as is comfortable). Then slowly slide your foot back down until your leg is flat on the floor again.  Angel Wings: Lying on your back spread your legs to the side as far apart as you can without causing discomfort.  A rehabilitation program following serious knee injuries can speed recovery and prevent re-injury in the future due to weakened muscles. Contact your doctor or a physical therapist for more information on knee rehabilitation.   IF YOU ARE TRANSFERRED TO A SKILLED REHAB FACILITY If the patient is transferred to a skilled rehab facility following release from the hospital, a list of the current medications will be sent to the facility for the patient to continue.  When discharged from the skilled rehab facility, please have the facility set up the patient's [place] prior to being released. Also, the skilled facility will be responsible for providing the patient with their medications at time of release from the facility to include their pain medication, the muscle relaxants, and their blood thinner medication. If the patient is still at the rehab facility at time of the two week follow up appointment, the skilled rehab facility will also need to assist the patient in arranging follow up appointment in our office and any transportation needs.  MAKE SURE YOU:  Understand these instructions.  Get help right away if you are not doing well or get worse.    Pick up stool softner and laxative for home use following surgery while on pain medications. Do not submerge incision under water. Please use good hand washing techniques while changing dressing each day. May shower starting three days after surgery. Please use a clean towel to pat the incision dry following  showers. Continue to use ice for pain and swelling after surgery. Do not use any lotions or creams on the incision until instructed by your surgeon.  Gabapentin 300 mg Protocol Take a 300 mg capsule three times a day for one week, Then a 300 mg capsule twice a day for one week, Then a 300 mg capsule once a day for  one week, then discontinue the Gabapentin.   Take Xarelto for two and a half more weeks following discharge from the hospital, then discontinue Xarelto. Once the patient has completed the Xarelto, they may resume the 81 mg Aspirin.    Information on my medicine - XARELTO (Rivaroxaban)  This medication education was reviewed with me or my healthcare representative as part of my discharge preparation.  The pharmacist that spoke with me during my hospital stay was:  Henreitta Leber. PharmD  Why was Xarelto prescribed for you? Xarelto was prescribed for you to reduce the risk of blood clots forming after orthopedic surgery. The medical term for these abnormal blood clots is venous thromboembolism (VTE).  What do you need to know about xarelto ? Take your Xarelto ONCE DAILY at the same time every day. You may take it either with or without food.  If you have difficulty swallowing the tablet whole, you may crush it and mix in applesauce just prior to taking your dose.  Take Xarelto exactly as prescribed by your doctor and DO NOT stop taking Xarelto without talking to the doctor who prescribed the medication.  Stopping without other VTE prevention medication to take the place of Xarelto may increase your risk of developing a clot.  After discharge, you should have regular check-up appointments with your healthcare provider that is prescribing your Xarelto.    What do you do if you miss a dose? If you miss a dose, take it as soon as you remember on the same day then continue your regularly scheduled once daily regimen the next day. Do not take two doses of Xarelto on the same  day.   Important Safety Information A possible side effect of Xarelto is bleeding. You should call your healthcare provider right away if you experience any of the following: ? Bleeding from an injury or your nose that does not stop. ? Unusual colored urine (red or dark brown) or unusual colored stools (red or black). ? Unusual bruising for unknown reasons. ? A serious fall or if you hit your head (even if there is no bleeding).  Some medicines may interact with Xarelto and might increase your risk of bleeding while on Xarelto. To help avoid this, consult your healthcare provider or pharmacist prior to using any new prescription or non-prescription medications, including herbals, vitamins, non-steroidal anti-inflammatory drugs (NSAIDs) and supplements.  This website has more information on Xarelto: [email or web address]

## 2016-02-12 NOTE — Evaluation (Signed)
Occupational Therapy Evaluation Patient Details Name: Leslie Koch MRN: [id number] DOB: 1955-06-16 Today's Date: 02/12/2016    History of Present Illness Right total knee arthroplasty revision   Clinical Impression   Pt is at min A level with LB ADLs and sup with ADL mobility. Pt familiar with DME and A/E from previous knee surgery and will have 24/7 assist from her mother at home. All education completed and no further acute OT indicated at this time    Follow Up Recommendations  No OT follow up;Supervision - Intermittent    Equipment Recommendations  Other (comment) Management consultant)    Recommendations for Other Services       Precautions / Restrictions Precautions Precautions: Knee Precaution Comments: did not wear KI Required Braces or Orthoses: Knee Immobilizer - Right Knee Immobilizer - Right: Discontinue once straight leg raise with < 10 degree lag Restrictions Weight Bearing Restrictions: No      Mobility Bed Mobility Overal bed mobility: Modified Independent                Transfers Overall transfer level: Needs assistance Equipment used: Rolling walker (2 wheeled) Transfers: Sit to/from Stand Sit to Stand: Supervision              Balance Overall balance assessment: No apparent balance deficits (not formally assessed)                                          ADL Overall ADL's : Needs assistance/impaired     Grooming: Wash/dry hands;Wash/dry face;Standing;Supervision/safety;Set up;With caregiver independent assisting   Upper Body Bathing: Set up;With caregiver independent assisting   Lower Body Bathing: Minimal assistance;With caregiver independent assisting   Upper Body Dressing : Set up;With caregiver independent assisting   Lower Body Dressing: Minimal assistance;With caregiver independent assisting   Toilet Transfer: Supervision/safety;RW;Ambulation;Comfort height toilet   Toileting- Clothing Manipulation and  Hygiene: Min guard;Sit to/from stand;With caregiver independent assisting   Tub/ Shower Transfer: Supervision/safety;3 in [employer];Ambulation     General ADL Comments: pt familiar with DME and ADL A/E from previous knee surgery     Vision Vision Assessment?: No apparent visual deficits              Pertinent Vitals/Pain Pain Assessment: 0-10 Pain Score: 5  Pain Location: R knee Pain Descriptors / Indicators: Aching;Sore Pain Intervention(s): Premedicated before session;Monitored during session;Repositioned;Ice applied     Hand Dominance Right   Extremity/Trunk Assessment Upper Extremity Assessment Upper Extremity Assessment: Overall WFL for tasks assessed   Lower Extremity Assessment Lower Extremity Assessment: Defer to PT evaluation RLE Deficits / Details: knee flexion 10-45   Cervical / Trunk Assessment Cervical / Trunk Assessment: Normal   Communication Communication Communication: No difficulties   Cognition Arousal/Alertness: Awake/alert Behavior During Therapy: WFL for tasks assessed/performed Overall Cognitive Status: Within Functional Limits for tasks assessed                     General Comments   Pt very pleasant and cooperative, mother is very supportive                 Home Living Family/patient expects to be discharged to:: Private residence Living Arrangements: Spouse/significant other Available Help at Discharge: Family Type of Home: House Home Access: Stairs to enter Technical brewer of Steps: 4   Home Layout: One level     Bathroom Shower/Tub:  Walk-in shower   Bathroom Toilet: Standard     Home Equipment: None          Prior Functioning/Environment Level of Independence: Independent                 OT Problem List: Pain   OT Treatment/Interventions:      OT Goals(Current goals can be found in the care plan section) Acute Rehab OT Goals Patient Stated Goal: to have no pain OT Goal  Formulation: With patient/family  OT Frequency:     Barriers to D/C:    no barriers       Co-evaluation              End of Session Equipment Utilized During Treatment: Rolling walker CPM Right Knee CPM Right Knee: Off  Activity Tolerance: Patient tolerated treatment well Patient left: in bed;with call bell/phone within reach;with family/visitor present   Time: 1002-1020 OT Time Calculation (min): 18 min Charges:  OT General Charges $OT Visit: 1 Procedure OT Evaluation $OT Eval Low Complexity: 1 Procedure G-Codes:    Britt Bottom 02/12/2016, 2:18 PM

## 2016-02-12 NOTE — Progress Notes (Signed)
   Subjective: 1 Day Post-Op Procedure(s) (LRB): RIGHT TOTAL KNEE ARTHROPLASTY REVISION (Right) Patient reports pain as moderate.   Patient seen in rounds by Dr. Wynelle Link. Patient is having problems with pain in the knee, requiring pain medications We will start therapy today. Neurontin tapering protocol added by Dr. Wynelle Link to take over a three week period Plan is to go Home after hospital stay.  Plan is for tomorrow to go home.  Objective: Vital signs in last 24 hours: Temp:  [97.4 F (36.3 C)-98.4 F (36.9 C)] 98.3 F (36.8 C) (02/01 0627) Pulse Rate:  [67-89] 74 (02/01 0627) Resp:  [9-23] 16 (02/01 0627) BP: (116-173)/(64-97) 121/69 (02/01 0627) SpO2:  [97 %-100 %] 98 % (02/01 0627) Weight:  [97.5 kg (215 lb)] 97.5 kg (215 lb) (01/31 0829)  Intake/Output from previous day:  Intake/Output Summary (Last 24 hours) at 02/12/16 0729 Last data filed at 02/12/16 0600  Gross per 24 hour  Intake             2635 ml  Output             4410 ml  Net            -1775 ml    Intake/Output this shift: No intake/output data recorded.  Labs:  Recent Labs  02/12/16 0413  HGB 11.7*    Recent Labs  02/12/16 0413  WBC 13.1*  RBC 3.95  HCT 35.2*  PLT 252    Recent Labs  02/12/16 0413  NA 141  K 4.2  CL 106  CO2 29  BUN 7  CREATININE 0.67  GLUCOSE 129*  CALCIUM 8.5*   No results for input(s): LABPT, INR in the last 72 hours.  EXAM General - Patient is Alert, Appropriate and Oriented Extremity - Neurovascular intact Sensation intact distally Dressing - dressing C/D/I Motor Function - intact, moving foot and toes well on exam.  Hemovac pulled without difficulty.  Dressing change tomorrow.  Past Medical History:  Diagnosis Date  . Arthritis   . GERD (gastroesophageal reflux disease)   . Hypertension   . PONV (postoperative nausea and vomiting)     Assessment/Plan: 1 Day Post-Op Procedure(s) (LRB): RIGHT TOTAL KNEE ARTHROPLASTY REVISION (Right) Principal  Problem:   Failed total knee arthroplasty, sequela Active Problems:   Failed total knee arthroplasty ([place])  Estimated body mass index is 39.32 kg/m as calculated from the following:   Height as of this encounter: 5\' 2"  (1.575 m).   Weight as of this encounter: 97.5 kg (215 lb). Advance diet Up with therapy Plan for discharge tomorrow Discharge home with home health  Extra fluids this morning before therapy  DVT Prophylaxis - Xarelto Weight-Bearing as tolerated to right leg D/C O2 and Pulse OX and try on Room Air  Arlee Muslim, [name] Orthopaedic Surgery 02/12/2016, 7:29 AM

## 2016-02-12 NOTE — Care Management Note (Signed)
Case Management Note  Patient Details  Name: Leslie Koch MRN: [id number] Date of Birth: 12-15-1955  Subjective/Objective:                  right total knee arthroplasty Action/Plan: Discharge [lanning Expected Discharge Date:                  Expected Discharge Plan:  [place]  In-House Referral:     Discharge planning Services  CM Consult  Post Acute Care Choice:  Home Health Choice offered to:  Patient  DME Arranged:  N/A DME Agency:  NA  HH Arranged:  PT [place] Agency:  [facility] ([facility]  Status of Service:  Completed, signed off  If discussed at [employer], dates discussed:    Additional Comments: CM met with pt in room to offer choice of home health agency. Pt chooses [facility] to render HHPT. Referral called to [facility] rep, Tim. Pt states she has all DME needed at home.  NO other CM needs were communicated. Dellie Catholic, RN 02/12/2016, 1:15 PM

## 2016-02-12 NOTE — Progress Notes (Signed)
Physical Therapy Treatment Patient Details Name: Leslie Koch MRN: [id number] DOB: 1955/10/31 Today's Date: 02/12/2016    History of Present Illness Right total knee arthroplasty revision    PT Comments    Progressing with mobility and strength/ROM right knee. Plans DC tomorrow.  Follow Up Recommendations  Home health PT;Supervision/Assistance - 24 hour     Equipment Recommendations  None recommended by PT    Recommendations for Other Services       Precautions / Restrictions Precautions Precautions: Knee Precaution Comments: did not wear KI Required Braces or Orthoses: Knee Immobilizer - Right Knee Immobilizer - Right: Discontinue once straight leg raise with < 10 degree lag Restrictions Weight Bearing Restrictions: No    Mobility  Bed Mobility Overal bed mobility: Modified Independent                Transfers Overall transfer level: Needs assistance Equipment used: Rolling walker (2 wheeled) Transfers: Sit to/from Stand Sit to Stand: Supervision            Ambulation/Gait Ambulation/Gait assistance: Supervision Ambulation Distance (Feet): 220 Feet Assistive device: Rolling walker (2 wheeled) Gait Pattern/deviations: Step-to pattern;Step-through pattern         Stairs Stairs: Yes   Stair Management: Two rails;Step to pattern;Forwards Number of Stairs: 6 General stair comments: total. cues for sequence  Wheelchair Mobility Wheelchair Mobility Wheelchair mobility: Yes  Modified Rankin (Stroke Patients Only)       Balance Overall balance assessment: No apparent balance deficits (not formally assessed)                                  Cognition Arousal/Alertness: Awake/alert Behavior During Therapy: WFL for tasks assessed/performed Overall Cognitive Status: Within Functional Limits for tasks assessed                      Exercises Total Joint Exercises Quad Sets: AROM;Right;Left;10 reps Heel Slides:  AAROM;Right;10 reps Hip ABduction/ADduction: AAROM;Right;10 reps Straight Leg Raises: AAROM;Right;10 reps    General Comments        Pertinent Vitals/Pain Pain Assessment: 0-10 Pain Score: 3  Pain Location: R knee Pain Descriptors / Indicators: Aching;Sore Pain Intervention(s): Monitored during session;Premedicated before session    Home Living Family/patient expects to be discharged to:: Private residence Living Arrangements: Spouse/significant other Available Help at Discharge: Family Type of Home: House Home Access: Stairs to enter   Home Layout: One level Home Equipment: None      Prior Function Level of Independence: Independent          PT Goals (current goals can now be found in the care plan section) Acute Rehab PT Goals Patient Stated Goal: to have no pain Progress towards PT goals: Progressing toward goals    Frequency    7X/week      PT Plan Current plan remains appropriate    Co-evaluation             End of Session   Activity Tolerance: Patient tolerated treatment well Patient left: in bed;with call bell/phone within reach;with family/visitor present     Time: 1356-1425 PT Time Calculation (min) (ACUTE ONLY): 29 min  Charges:  $Gait Training: 8-22 mins $Therapeutic Exercise: 8-22 mins                    G Codes:      Claretha Cooper 02/12/2016, 5:10 PM

## 2016-02-13 LAB — BASIC METABOLIC PANEL
Anion gap: 6 (ref 5–15)
BUN: 9 mg/dL (ref 6–20)
CHLORIDE: 106 mmol/L (ref 101–111)
CO2: 28 mmol/L (ref 22–32)
CREATININE: 0.6 mg/dL (ref 0.44–1.00)
Calcium: 8.3 mg/dL — ABNORMAL LOW (ref 8.9–10.3)
Glucose, Bld: 101 mg/dL — ABNORMAL HIGH (ref 65–99)
POTASSIUM: 3.6 mmol/L (ref 3.5–5.1)
SODIUM: 140 mmol/L (ref 135–145)

## 2016-02-13 LAB — CBC
HCT: 32.6 % — ABNORMAL LOW (ref 36.0–46.0)
HEMOGLOBIN: 11.3 g/dL — AB (ref 12.0–15.0)
MCH: 31.7 pg (ref 26.0–34.0)
MCHC: 34.7 g/dL (ref 30.0–36.0)
MCV: 91.3 fL (ref 78.0–100.0)
Platelets: 225 10*3/uL (ref 150–400)
RBC: 3.57 MIL/uL — AB (ref 3.87–5.11)
RDW: 13.6 % (ref 11.5–15.5)
WBC: 12.1 10*3/uL — ABNORMAL HIGH (ref 4.0–10.5)

## 2016-02-13 MED ORDER — TRAMADOL HCL 50 MG PO TABS: 50.0000 mg | ORAL_TABLET | Freq: Four times a day (QID) | ORAL | 0 refills | Status: DC | PRN

## 2016-02-13 MED ORDER — METHOCARBAMOL 500 MG PO TABS: 500.0000 mg | ORAL_TABLET | Freq: Four times a day (QID) | ORAL | 0 refills | Status: DC | PRN

## 2016-02-13 MED ORDER — GABAPENTIN 300 MG PO CAPS: 300.0000 mg | ORAL_CAPSULE | Freq: Three times a day (TID) | ORAL | 0 refills | Status: DC

## 2016-02-13 MED ORDER — RANITIDINE HCL 150 MG PO TABS: 150.0000 mg | ORAL_TABLET | Freq: Two times a day (BID) | ORAL | 0 refills | Status: DC | PRN

## 2016-02-13 MED ORDER — RIVAROXABAN 10 MG PO TABS: 10.0000 mg | ORAL_TABLET | Freq: Every day | ORAL | 0 refills | Status: DC

## 2016-02-13 MED ORDER — OXYCODONE HCL 5 MG PO TABS: 5.0000 mg | ORAL_TABLET | ORAL | 0 refills | Status: DC | PRN

## 2016-02-13 NOTE — Progress Notes (Signed)
   Subjective: 2 Days Post-Op Procedure(s) (LRB): RIGHT TOTAL KNEE ARTHROPLASTY REVISION (Right) Patient reports pain as mild.   Patient seen in rounds by Dr. Wynelle Link. Patient is well, but has had some minor complaints of pain in the knee, requiring pain medications Patient is ready to go home today.  Objective: Vital signs in last 24 hours: Temp:  [98 F (36.7 C)-98.1 F (36.7 C)] 98.1 F (36.7 C) (02/02 0556) Pulse Rate:  [77-94] 94 (02/02 0556) Resp:  [18] 18 (02/02 0556) BP: (124-164)/(77-87) 145/87 (02/02 0556) SpO2:  [95 %-98 %] 98 % (02/02 0556)  Intake/Output from previous day:  Intake/Output Summary (Last 24 hours) at 02/13/16 0726 Last data filed at 02/13/16 0600  Gross per 24 hour  Intake          3056.25 ml  Output              500 ml  Net          2556.25 ml    Intake/Output this shift: No intake/output data recorded.  Labs:  Recent Labs  02/12/16 0413 02/13/16 0412  HGB 11.7* 11.3*    Recent Labs  02/12/16 0413 02/13/16 0412  WBC 13.1* 12.1*  RBC 3.95 3.57*  HCT 35.2* 32.6*  PLT 252 225    Recent Labs  02/12/16 0413 02/13/16 0412  NA 141 140  K 4.2 3.6  CL 106 106  CO2 29 28  BUN 7 9  CREATININE 0.67 0.60  GLUCOSE 129* 101*  CALCIUM 8.5* 8.3*   No results for input(s): LABPT, INR in the last 72 hours.  EXAM: General - Patient is Alert, Appropriate and Oriented Extremity - Neurovascular intact Sensation intact distally Dorsiflexion/Plantar flexion intact Incision - clean, dry, no drainage Motor Function - intact, moving foot and toes well on exam.   Assessment/Plan: 2 Days Post-Op Procedure(s) (LRB): RIGHT TOTAL KNEE ARTHROPLASTY REVISION (Right) Procedure(s) (LRB): RIGHT TOTAL KNEE ARTHROPLASTY REVISION (Right) Past Medical History:  Diagnosis Date  . Arthritis   . GERD (gastroesophageal reflux disease)   . Hypertension   . PONV (postoperative nausea and vomiting)    Principal Problem:   Failed total knee  arthroplasty, sequela Active Problems:   Failed total knee arthroplasty ([facility])  Estimated body mass index is 39.32 kg/m as calculated from the following:   Height as of this encounter: 5\' 2"  (1.575 m).   Weight as of this encounter: 97.5 kg (215 lb). Up with therapy Discharge home with home health Diet - Cardiac diet Follow up - in 2 weeks Activity - WBAT Disposition - Home Condition Upon Discharge - Good D/C Meds - See DC Summary DVT Prophylaxis - Xarelto  Arlee Muslim, [name] Orthopaedic Surgery 02/13/2016, 7:26 AM

## 2016-02-13 NOTE — Progress Notes (Signed)
Physical Therapy Treatment Patient Details Name: Leslie Koch MRN: [id number] DOB: [birth year] Today's Date: 02/13/2016    History of Present Illness Right total knee arthroplasty revision    PT Comments    Ready for DC.  Follow Up Recommendations  Home health PT;Supervision/Assistance - 24 hour     Equipment Recommendations       Recommendations for Other Services       Precautions / Restrictions Precautions Precautions: Knee Restrictions Weight Bearing Restrictions: No    Mobility  Bed Mobility Overal bed mobility: Modified Independent                Transfers Overall transfer level: Modified independent                  Ambulation/Gait Ambulation/Gait assistance: Supervision Ambulation Distance (Feet): 220 Feet   Gait Pattern/deviations: Step-to pattern;Step-through pattern     General Gait Details: cues for sequence    Stairs            Wheelchair Mobility    Modified Rankin (Stroke Patients Only)       Balance                                    Cognition Arousal/Alertness: Awake/alert                          Exercises Total Joint Exercises Ankle Circles/Pumps: AROM;Both;10 reps Quad Sets: AROM;Both;10 reps Heel Slides: AROM;Right;10 reps Hip ABduction/ADduction: AROM;Right;10 reps Straight Leg Raises: Right;AAROM;10 reps Goniometric ROM: 10-50 knee flex(r)    General Comments        Pertinent Vitals/Pain Pain Score: 3  Pain Location: R knee Pain Descriptors / Indicators: Aching;Sore Pain Intervention(s): Monitored during session;Premedicated before session    Home Living                      Prior Function            PT Goals (current goals can now be found in the care plan section) Progress towards PT goals: Progressing toward goals    Frequency    7X/week      PT Plan Current plan remains appropriate    Co-evaluation             End of Session    Activity Tolerance: Patient tolerated treatment well Patient left: in bed;with call bell/phone within reach;with family/visitor present     Time: [id number] PT Time Calculation (min) (ACUTE ONLY): 19 min  Charges:  $Gait Training: 8-22 mins                    G Codes:      Claretha Cooper 02/13/2016, 1:48 PM

## 2016-02-13 NOTE — Discharge Summary (Signed)
Physician Discharge Summary   Patient ID: Leslie Koch MRN: [id number] DOB/AGE: 60-May-1957 60 y.o.  Admit date: 02/11/2016 Discharge date: 02-13-2016  Primary Diagnosis:  Painful loose right total knee arthroplasty.  Admission Diagnoses:  Past Medical History:  Diagnosis Date  . Arthritis   . GERD (gastroesophageal reflux disease)   . Hypertension   . PONV (postoperative nausea and vomiting)    Discharge Diagnoses:   Principal Problem:   Failed total knee arthroplasty, sequela Active Problems:   Failed total knee arthroplasty ([facility])  Estimated body mass index is 39.32 kg/m as calculated from the following:   Height as of this encounter: '5\' 2"'$  (1.575 m).   Weight as of this encounter: 97.5 kg (215 lb).  Procedure:  Procedure(s) (LRB): RIGHT TOTAL KNEE ARTHROPLASTY REVISION (Right)   Consults: None  HPI: Leslie Koch is a 61 year old female with complex history in regard to her right knee.  She has had 2 right knee total knee arthroplasties done in [place].  She presents with significant right pretibial pain.  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  It appears that the tibia may be loose.  She has significant pain and dysfunction. She presents now for a redo revision total knee arthroplasty.  Laboratory Data: Admission on 02/11/2016  Component Date Value Ref Range Status  . WBC 02/12/2016 13.1* 4.0 - 10.5 K/uL Final  . RBC 02/12/2016 3.95  3.87 - 5.11 MIL/uL Final  . Hemoglobin 02/12/2016 11.7* 12.0 - 15.0 g/dL Final  . HCT 02/12/2016 35.2* 36.0 - 46.0 % Final  . MCV 02/12/2016 89.1  78.0 - 100.0 fL Final  . MCH 02/12/2016 29.6  26.0 - 34.0 pg Final  . MCHC 02/12/2016 33.2  30.0 - 36.0 g/dL Final  . RDW 02/12/2016 13.1  11.5 - 15.5 % Final  . Platelets 02/12/2016 252  150 - 400 K/uL Final  . Sodium 02/12/2016 141  135 - 145 mmol/L Final  . Potassium 02/12/2016 4.2  3.5 - 5.1 mmol/L Final  . Chloride 02/12/2016 106  101 - 111 mmol/L Final  . CO2 02/12/2016 29  22  - 32 mmol/L Final  . Glucose, Bld 02/12/2016 129* 65 - 99 mg/dL Final  . BUN 02/12/2016 7  6 - 20 mg/dL Final  . Creatinine, Ser 02/12/2016 0.67  0.44 - 1.00 mg/dL Final  . Calcium 02/12/2016 8.5* 8.9 - 10.3 mg/dL Final  . GFR calc non Af Amer 02/12/2016 >60  >60 mL/min Final  . GFR calc Af Amer 02/12/2016 >60  >60 mL/min Final   Comment: (NOTE) The eGFR has been calculated using the CKD EPI equation. This calculation has not been validated in all clinical situations. eGFR's persistently <60 mL/min signify possible Chronic Kidney Disease.   . Anion gap 02/12/2016 6  5 - 15 Final  . WBC 02/13/2016 12.1* 4.0 - 10.5 K/uL Final  . RBC 02/13/2016 3.57* 3.87 - 5.11 MIL/uL Final  . Hemoglobin 02/13/2016 11.3* 12.0 - 15.0 g/dL Final  . HCT 02/13/2016 32.6* 36.0 - 46.0 % Final  . MCV 02/13/2016 91.3  78.0 - 100.0 fL Final  . MCH 02/13/2016 31.7  26.0 - 34.0 pg Final  . MCHC 02/13/2016 34.7  30.0 - 36.0 g/dL Final  . RDW 02/13/2016 13.6  11.5 - 15.5 % Final  . Platelets 02/13/2016 225  150 - 400 K/uL Final  . Sodium 02/13/2016 140  135 - 145 mmol/L Final  . Potassium 02/13/2016 3.6  3.5 - 5.1 mmol/L Final  .  Chloride 02/13/2016 106  101 - 111 mmol/L Final  . CO2 02/13/2016 28  22 - 32 mmol/L Final  . Glucose, Bld 02/13/2016 101* 65 - 99 mg/dL Final  . BUN 02/13/2016 9  6 - 20 mg/dL Final  . Creatinine, Ser 02/13/2016 0.60  0.44 - 1.00 mg/dL Final  . Calcium 02/13/2016 8.3* 8.9 - 10.3 mg/dL Final  . GFR calc non Af Amer 02/13/2016 >60  >60 mL/min Final  . GFR calc Af Amer 02/13/2016 >60  >60 mL/min Final   Comment: (NOTE) The eGFR has been calculated using the CKD EPI equation. This calculation has not been validated in all clinical situations. eGFR's persistently <60 mL/min signify possible Chronic Kidney Disease.   Georgiann Hahn gap 02/13/2016 6  5 - 15 Final  Hospital Outpatient Visit on 02/04/2016  Component Date Value Ref Range Status  . aPTT 02/04/2016 23* 24 - 36 seconds Final  . WBC  02/04/2016 9.1  4.0 - 10.5 K/uL Final  . RBC 02/04/2016 4.35  3.87 - 5.11 MIL/uL Final  . Hemoglobin 02/04/2016 13.3  12.0 - 15.0 g/dL Final  . HCT 02/04/2016 39.3  36.0 - 46.0 % Final  . MCV 02/04/2016 90.3  78.0 - 100.0 fL Final  . MCH 02/04/2016 30.6  26.0 - 34.0 pg Final  . MCHC 02/04/2016 33.8  30.0 - 36.0 g/dL Final  . RDW 02/04/2016 13.5  11.5 - 15.5 % Final  . Platelets 02/04/2016 269  150 - 400 K/uL Final  . Sodium 02/04/2016 138  135 - 145 mmol/L Final  . Potassium 02/04/2016 4.1  3.5 - 5.1 mmol/L Final  . Chloride 02/04/2016 102  101 - 111 mmol/L Final  . CO2 02/04/2016 28  22 - 32 mmol/L Final  . Glucose, Bld 02/04/2016 93  65 - 99 mg/dL Final  . BUN 02/04/2016 17  6 - 20 mg/dL Final  . Creatinine, Ser 02/04/2016 0.64  0.44 - 1.00 mg/dL Final  . Calcium 02/04/2016 8.7* 8.9 - 10.3 mg/dL Final  . Total Protein 02/04/2016 7.0  6.5 - 8.1 g/dL Final  . Albumin 02/04/2016 3.7  3.5 - 5.0 g/dL Final  . AST 02/04/2016 23  15 - 41 U/L Final  . ALT 02/04/2016 16  14 - 54 U/L Final  . Alkaline Phosphatase 02/04/2016 63  38 - 126 U/L Final  . Total Bilirubin 02/04/2016 0.5  0.3 - 1.2 mg/dL Final  . GFR calc non Af Amer 02/04/2016 >60  >60 mL/min Final  . GFR calc Af Amer 02/04/2016 >60  >60 mL/min Final   Comment: (NOTE) The eGFR has been calculated using the CKD EPI equation. This calculation has not been validated in all clinical situations. eGFR's persistently <60 mL/min signify possible Chronic Kidney Disease.   . Anion gap 02/04/2016 8  5 - 15 Final  . Prothrombin Time 02/04/2016 13.1  11.4 - 15.2 seconds Final  . INR 02/04/2016 0.99   Final  . ABO/RH(D) 02/04/2016 A POS   Final  . Antibody Screen 02/04/2016 NEG   Final  . Sample Expiration 02/04/2016 02/14/2016   Final  . Extend sample reason 02/04/2016 NO TRANSFUSIONS OR PREGNANCY IN THE PAST 3 MONTHS   Final  . MRSA, PCR 02/04/2016 NEGATIVE  NEGATIVE Final  . Staphylococcus aureus 02/04/2016 NEGATIVE  NEGATIVE Final    Comment:        The Xpert SA Assay (FDA approved for NASAL specimens in patients over 39 years of age), is one component of a  comprehensive surveillance program.  Test performance has been validated by [facility] for patients greater than or equal to 6 year old. It is not intended to diagnose infection nor to guide or monitor treatment.   . ABO/RH(D) 02/04/2016 A POS   Final  Office Visit on 01/09/2016  Component Date Value Ref Range Status  . WBC 01/23/2016 6.1  3.4 - 10.8 x10E3/uL Final  . RBC 01/23/2016 4.46  3.77 - 5.28 x10E6/uL Final  . Hemoglobin 01/23/2016 14.2  11.1 - 15.9 g/dL Final  . Hematocrit 01/23/2016 41.6  34.0 - 46.6 % Final  . MCV 01/23/2016 93  79 - 97 fL Final  . MCH 01/23/2016 31.8  26.6 - 33.0 pg Final  . MCHC 01/23/2016 34.1  31.5 - 35.7 g/dL Final  . RDW 01/23/2016 14.4  12.3 - 15.4 % Final  . Platelets 01/23/2016 290  150 - 379 x10E3/uL Final  . Neutrophils 01/23/2016 52  Not Estab. % Final  . Lymphs 01/23/2016 39  Not Estab. % Final  . Monocytes 01/23/2016 5  Not Estab. % Final  . Eos 01/23/2016 3  Not Estab. % Final  . Basos 01/23/2016 1  Not Estab. % Final  . Neutrophils Absolute 01/23/2016 3.2  1.4 - 7.0 x10E3/uL Final  . Lymphocytes Absolute 01/23/2016 2.4  0.7 - 3.1 x10E3/uL Final  . Monocytes Absolute 01/23/2016 0.3  0.1 - 0.9 x10E3/uL Final  . EOS (ABSOLUTE) 01/23/2016 0.2  0.0 - 0.4 x10E3/uL Final  . Basophils Absolute 01/23/2016 0.0  0.0 - 0.2 x10E3/uL Final  . Immature Granulocytes 01/23/2016 0  Not Estab. % Final  . Immature Grans (Abs) 01/23/2016 0.0  0.0 - 0.1 x10E3/uL Final  . Glucose 01/23/2016 91  65 - 99 mg/dL Final  . BUN 01/23/2016 14  6 - 24 mg/dL Final  . Creatinine, Ser 01/23/2016 0.62  0.57 - 1.00 mg/dL Final  . GFR calc non Af Amer 01/23/2016 99  >59 mL/min/1.73 Final  . GFR calc Af Amer 01/23/2016 114  >59 mL/min/1.73 Final  . BUN/Creatinine Ratio 01/23/2016 23  9 - 23 Final  . Sodium 01/23/2016 142  134 - 144 mmol/L  Final  . Potassium 01/23/2016 4.7  3.5 - 5.2 mmol/L Final  . Chloride 01/23/2016 102  96 - 106 mmol/L Final  . CO2 01/23/2016 25  18 - 29 mmol/L Final  . Calcium 01/23/2016 8.9  8.7 - 10.2 mg/dL Final  . Total Protein 01/23/2016 6.5  6.0 - 8.5 g/dL Final  . Albumin 01/23/2016 3.9  3.5 - 5.5 g/dL Final  . Globulin, Total 01/23/2016 2.6  1.5 - 4.5 g/dL Final  . Albumin/Globulin Ratio 01/23/2016 1.5  1.2 - 2.2 Final  . Bilirubin Total 01/23/2016 0.3  0.0 - 1.2 mg/dL Final  . Alkaline Phosphatase 01/23/2016 63  39 - 117 IU/L Final  . AST 01/23/2016 15  0 - 40 IU/L Final  . ALT 01/23/2016 18  0 - 32 IU/L Final  . Hgb A1c MFr Bld 01/23/2016 5.5  4.8 - 5.6 % Final   Comment:          Pre-diabetes: 5.7 - 6.4          Diabetes: >6.4          Glycemic control for adults with diabetes: <7.0   . Est. average glucose Bld gHb Est-m* 01/23/2016 111  mg/dL Final  . TSH 01/23/2016 1.850  0.450 - 4.500 uIU/mL Final     X-Rays:No results found.  EKG: Orders  placed or performed in visit on 01/09/16  . EKG 12-Lead     Hospital Course: VASTI YAGI is a 61 y.o. who was admitted to [facility]. They were brought to the operating room on 02/11/2016 and underwent Procedure(s): RIGHT TOTAL KNEE ARTHROPLASTY REVISION.  Patient tolerated the procedure well and was later transferred to the recovery room and then to the orthopaedic floor for postoperative care.  They were given PO and IV analgesics for pain control following their surgery.  They were given 24 hours of postoperative antibiotics of  Anti-infectives    Start     Dose/Rate Route Frequency Ordered Stop   02/11/16 1730  ceFAZolin (ANCEF) IVPB 2g/100 mL premix     2 g 200 mL/hr over 30 Minutes Intravenous Every 6 hours 02/11/16 1655 02/11/16 2359   02/11/16 0808  ceFAZolin (ANCEF) IVPB 2g/100 mL premix     2 g 200 mL/hr over 30 Minutes Intravenous On call to O.R. 02/11/16 [id number] 02/11/16 1112     and started on DVT prophylaxis in the form  of Xarelto.   PT and OT were ordered for total joint protocol.  Discharge planning consulted to help with postop disposition and equipment needs.  Patient had a tough night on the evening of surgery with pain.  They started to get up OOB with therapy on day one and walked over 100 feet. Hemovac drain was pulled without difficulty.  Continued to work with therapy into day two.  Dressing was changed on day two and the incision was healing well.  Patient was seen in rounds by Dr. Wynelle Link on POD 2 and was ready to go home.  Discharge home with home health Diet - Cardiac diet Follow up - in 2 weeks Activity - WBAT Disposition - Home Condition Upon Discharge - Good D/C Meds - See DC Summary DVT Prophylaxis - Xarelto  Discharge Instructions    Call MD / Call 911    Complete by:  As directed    If you experience chest pain or shortness of breath, CALL 911 and be transported to the hospital emergency room.  If you develope a fever above 101 F, pus (white drainage) or increased drainage or redness at the wound, or calf pain, call your surgeon's office.   Change dressing    Complete by:  As directed    Change dressing daily with sterile 4 x 4 inch gauze dressing and apply TED hose. Do not submerge the incision under water.   Constipation Prevention    Complete by:  As directed    Drink plenty of fluids.  Prune juice may be helpful.  You may use a stool softener, such as Colace (over the counter) 100 mg twice a day.  Use MiraLax (over the counter) for constipation as needed.   Diet - low sodium heart healthy    Complete by:  As directed    Discharge instructions    Complete by:  As directed    Pick up stool softner and laxative for home use following surgery while on pain medications. Do not submerge incision under water. Please use good hand washing techniques while changing dressing each day. May shower starting three days after surgery. Please use a clean towel to pat the incision dry following  showers. Continue to use ice for pain and swelling after surgery. Do not use any lotions or creams on the incision until instructed by your surgeon.  Wear both TED hose on both legs during the day  every day for three weeks, but may have off at night at home.  Postoperative Constipation Protocol  Constipation - defined medically as fewer than three stools per week and severe constipation as less than one stool per week.  One of the most common issues patients have following surgery is constipation.  Even if you have a regular bowel pattern at home, your normal regimen is likely to be disrupted due to multiple reasons following surgery.  Combination of anesthesia, postoperative narcotics, change in appetite and fluid intake all can affect your bowels.  In order to avoid complications following surgery, here are some recommendations in order to help you during your recovery period.  Colace (docusate) - Pick up an over-the-counter form of Colace or another stool softener and take twice a day as long as you are requiring postoperative pain medications.  Take with a full glass of water daily.  If you experience loose stools or diarrhea, hold the colace until you stool forms back up.  If your symptoms do not get better within 1 week or if they get worse, check with your doctor.  Dulcolax (bisacodyl) - Pick up over-the-counter and take as directed by the product packaging as needed to assist with the movement of your bowels.  Take with a full glass of water.  Use this product as needed if not relieved by Colace only.   MiraLax (polyethylene glycol) - Pick up over-the-counter to have on hand.  MiraLax is a solution that will increase the amount of water in your bowels to assist with bowel movements.  Take as directed and can mix with a glass of water, juice, soda, coffee, or tea.  Take if you go more than two days without a movement. Do not use MiraLax more than once per day. Call your doctor if you are  still constipated or irregular after using this medication for 7 days in a row.  If you continue to have problems with postoperative constipation, please contact the office for further assistance and recommendations.  If you experience "the worst abdominal pain ever" or develop nausea or vomiting, please contact the office immediatly for further recommendations for treatment.   Take Xarelto for two and a half more weeks, then discontinue Xarelto. Once the patient has completed the Xarelto, they may resume the 81 mg Aspirin.  Gabapentin 300 mg Protocol Take a 300 mg capsule three times a day for one week, Then a 300 mg capsule twice a day for one week, Then a 300 mg capsule once a day for one week, then discontinue the Gabapentin.    Do not put a pillow under the knee. Place it under the heel.    Complete by:  As directed    Do not sit on low chairs, stoools or toilet seats, as it may be difficult to get up from low surfaces    Complete by:  As directed    Driving restrictions    Complete by:  As directed    No driving until released by the physician.   Increase activity slowly as tolerated    Complete by:  As directed    Lifting restrictions    Complete by:  As directed    No lifting until released by the physician.   Patient may shower    Complete by:  As directed    You may shower without a dressing once there is no drainage.  Do not wash over the wound.  If drainage remains, do not shower until  drainage stops.   TED hose    Complete by:  As directed    Use stockings (TED hose) for 3 weeks on both leg(s).  You may remove them at night for sleeping.   Weight bearing as tolerated    Complete by:  As directed    Laterality:  right   Extremity:  Lower     Allergies as of 02/13/2016      Reactions   Montelukast Sodium Rash      Medication List    STOP taking these medications   aspirin 81 MG tablet   CELEBREX 200 MG capsule Generic drug:  celecoxib   Cholecalciferol 1000  units capsule   Fish Oil 1000 MG Caps   GLUCOSAMINE CHONDR 1500 COMPLX Caps   multivitamin with minerals Tabs tablet   OYSTER SHELL CALCIUM/D PO   PREMARIN 0.45 MG tablet Generic drug:  estrogens (conjugated)     TAKE these medications   gabapentin 300 MG capsule Commonly known as:  NEURONTIN Take 1 capsule (300 mg total) by mouth 3 (three) times daily. Take a 300 mg capsule three times a day for one week, Then 300 mg capsule twice a day for one week, Then 300 mg capsule once a day for one week, then discontinue the Gabapentin.   lisinopril-hydrochlorothiazide 20-12.5 MG tablet Commonly known as:  PRINZIDE,ZESTORETIC TAKE 1 TABLET DAILY   methocarbamol 500 MG tablet Commonly known as:  ROBAXIN Take 1 tablet (500 mg total) by mouth every 6 (six) hours as needed for muscle spasms.   Omeprazole 20 MG Tbec Take 20 mg by mouth 2 (two) times daily.   oxyCODONE 5 MG immediate release tablet Commonly known as:  Oxy IR/ROXICODONE Take 1-2 tablets (5-10 mg total) by mouth every 4 (four) hours as needed for moderate pain or severe pain.   oxymetazoline 0.05 % nasal spray Commonly known as:  AFRIN Place 1-2 sprays into both nostrils 2 (two) times daily as needed for congestion.   ranitidine 150 MG tablet Commonly known as:  ZANTAC Take 1 tablet (150 mg total) by mouth 2 (two) times daily as needed for heartburn.   rivaroxaban 10 MG Tabs tablet Commonly known as:  XARELTO Take 1 tablet (10 mg total) by mouth daily with breakfast. Take Xarelto for two and a half more weeks following discharge from the hospital, then discontinue Xarelto. Once the patient has completed the Xarelto, they may resume the 81 mg Aspirin.   senna-docusate 8.6-50 MG tablet Commonly known as:  Senokot-S Take 1 tablet by mouth at bedtime.   simvastatin 40 MG tablet Commonly known as:  ZOCOR TAKE 1 TABLET AT BEDTIME   traMADol 50 MG tablet Commonly known as:  ULTRAM Take 1-2 tablets (50-100 mg total) by  mouth every 6 (six) hours as needed (mild to moderate pain). What changed:  how much to take  reasons to take this   valACYclovir 1000 MG tablet Commonly known as:  VALTREX Take 1-2 tablets (1,000-2,000 mg total) by mouth every 12 (twelve) hours as needed. What changed:  reasons to take this   ZYRTEC ALLERGY 10 MG tablet Generic drug:  cetirizine Take 10 mg by mouth daily.      Follow-up Information    [facility] Follow up.   Specialty:  Home Health Services Why:  home health physical therapy Contact information: [place]  [id number] [phone]        Gearlean Alf, MD. Schedule an appointment as soon  as possible for a visit on 02/24/2016.   Specialty:  Orthopedic Surgery Contact information: [address] [place] [address] [phone]           Signed: Arlee Muslim, [name] Orthopaedic Surgery 02/13/2016, 7:39 AM

## 2016-02-15 DIAGNOSIS — Z79891 Long term (current) use of opiate analgesic: Secondary | ICD-10-CM | POA: Diagnosis not present

## 2016-02-15 DIAGNOSIS — Z7901 Long term (current) use of anticoagulants: Secondary | ICD-10-CM | POA: Diagnosis not present

## 2016-02-15 DIAGNOSIS — M1712 Unilateral primary osteoarthritis, left knee: Secondary | ICD-10-CM | POA: Diagnosis not present

## 2016-02-15 DIAGNOSIS — Z87891 Personal history of nicotine dependence: Secondary | ICD-10-CM | POA: Diagnosis not present

## 2016-02-15 DIAGNOSIS — T84032D Mechanical loosening of internal right knee prosthetic joint, subsequent encounter: Secondary | ICD-10-CM | POA: Diagnosis not present

## 2016-02-15 DIAGNOSIS — I10 Essential (primary) hypertension: Secondary | ICD-10-CM | POA: Diagnosis not present

## 2016-02-16 DIAGNOSIS — Z79891 Long term (current) use of opiate analgesic: Secondary | ICD-10-CM | POA: Diagnosis not present

## 2016-02-16 DIAGNOSIS — I10 Essential (primary) hypertension: Secondary | ICD-10-CM | POA: Diagnosis not present

## 2016-02-16 DIAGNOSIS — Z87891 Personal history of nicotine dependence: Secondary | ICD-10-CM | POA: Diagnosis not present

## 2016-02-16 DIAGNOSIS — M1712 Unilateral primary osteoarthritis, left knee: Secondary | ICD-10-CM | POA: Diagnosis not present

## 2016-02-16 DIAGNOSIS — T84032D Mechanical loosening of internal right knee prosthetic joint, subsequent encounter: Secondary | ICD-10-CM | POA: Diagnosis not present

## 2016-02-16 DIAGNOSIS — Z7901 Long term (current) use of anticoagulants: Secondary | ICD-10-CM | POA: Diagnosis not present

## 2016-02-18 DIAGNOSIS — Z79891 Long term (current) use of opiate analgesic: Secondary | ICD-10-CM | POA: Diagnosis not present

## 2016-02-18 DIAGNOSIS — I10 Essential (primary) hypertension: Secondary | ICD-10-CM | POA: Diagnosis not present

## 2016-02-18 DIAGNOSIS — T84032D Mechanical loosening of internal right knee prosthetic joint, subsequent encounter: Secondary | ICD-10-CM | POA: Diagnosis not present

## 2016-02-18 DIAGNOSIS — M1712 Unilateral primary osteoarthritis, left knee: Secondary | ICD-10-CM | POA: Diagnosis not present

## 2016-02-18 DIAGNOSIS — Z7901 Long term (current) use of anticoagulants: Secondary | ICD-10-CM | POA: Diagnosis not present

## 2016-02-18 DIAGNOSIS — Z87891 Personal history of nicotine dependence: Secondary | ICD-10-CM | POA: Diagnosis not present

## 2016-02-20 DIAGNOSIS — T84032D Mechanical loosening of internal right knee prosthetic joint, subsequent encounter: Secondary | ICD-10-CM | POA: Diagnosis not present

## 2016-02-20 DIAGNOSIS — Z7901 Long term (current) use of anticoagulants: Secondary | ICD-10-CM | POA: Diagnosis not present

## 2016-02-20 DIAGNOSIS — M1712 Unilateral primary osteoarthritis, left knee: Secondary | ICD-10-CM | POA: Diagnosis not present

## 2016-02-20 DIAGNOSIS — Z79891 Long term (current) use of opiate analgesic: Secondary | ICD-10-CM | POA: Diagnosis not present

## 2016-02-20 DIAGNOSIS — Z87891 Personal history of nicotine dependence: Secondary | ICD-10-CM | POA: Diagnosis not present

## 2016-02-20 DIAGNOSIS — I10 Essential (primary) hypertension: Secondary | ICD-10-CM | POA: Diagnosis not present

## 2016-02-23 DIAGNOSIS — Z7901 Long term (current) use of anticoagulants: Secondary | ICD-10-CM | POA: Diagnosis not present

## 2016-02-23 DIAGNOSIS — Z87891 Personal history of nicotine dependence: Secondary | ICD-10-CM | POA: Diagnosis not present

## 2016-02-23 DIAGNOSIS — T84032D Mechanical loosening of internal right knee prosthetic joint, subsequent encounter: Secondary | ICD-10-CM | POA: Diagnosis not present

## 2016-02-23 DIAGNOSIS — M1712 Unilateral primary osteoarthritis, left knee: Secondary | ICD-10-CM | POA: Diagnosis not present

## 2016-02-23 DIAGNOSIS — I10 Essential (primary) hypertension: Secondary | ICD-10-CM | POA: Diagnosis not present

## 2016-02-23 DIAGNOSIS — Z79891 Long term (current) use of opiate analgesic: Secondary | ICD-10-CM | POA: Diagnosis not present

## 2016-02-26 DIAGNOSIS — R2689 Other abnormalities of gait and mobility: Secondary | ICD-10-CM | POA: Diagnosis not present

## 2016-02-26 DIAGNOSIS — M25561 Pain in right knee: Secondary | ICD-10-CM | POA: Diagnosis not present

## 2016-02-26 DIAGNOSIS — M25661 Stiffness of right knee, not elsewhere classified: Secondary | ICD-10-CM | POA: Diagnosis not present

## 2016-02-26 DIAGNOSIS — R262 Difficulty in walking, not elsewhere classified: Secondary | ICD-10-CM | POA: Diagnosis not present

## 2016-02-27 ENCOUNTER — Other Ambulatory Visit: Payer: Self-pay

## 2016-02-27 DIAGNOSIS — E7800 Pure hypercholesterolemia, unspecified: Secondary | ICD-10-CM

## 2016-02-27 MED ORDER — SIMVASTATIN 40 MG PO TABS: 40.0000 mg | ORAL_TABLET | Freq: Every day | ORAL | 3 refills | Status: DC

## 2016-02-27 NOTE — Telephone Encounter (Signed)
Refill request from [facility] for Simvastatin. Please review-aa

## 2016-03-02 DIAGNOSIS — R2689 Other abnormalities of gait and mobility: Secondary | ICD-10-CM | POA: Diagnosis not present

## 2016-03-02 DIAGNOSIS — R262 Difficulty in walking, not elsewhere classified: Secondary | ICD-10-CM | POA: Diagnosis not present

## 2016-03-02 DIAGNOSIS — M25661 Stiffness of right knee, not elsewhere classified: Secondary | ICD-10-CM | POA: Diagnosis not present

## 2016-03-02 DIAGNOSIS — M25561 Pain in right knee: Secondary | ICD-10-CM | POA: Diagnosis not present

## 2016-03-04 DIAGNOSIS — R2689 Other abnormalities of gait and mobility: Secondary | ICD-10-CM | POA: Diagnosis not present

## 2016-03-04 DIAGNOSIS — M25561 Pain in right knee: Secondary | ICD-10-CM | POA: Diagnosis not present

## 2016-03-04 DIAGNOSIS — M25661 Stiffness of right knee, not elsewhere classified: Secondary | ICD-10-CM | POA: Diagnosis not present

## 2016-03-04 DIAGNOSIS — R262 Difficulty in walking, not elsewhere classified: Secondary | ICD-10-CM | POA: Diagnosis not present

## 2016-03-09 DIAGNOSIS — R2689 Other abnormalities of gait and mobility: Secondary | ICD-10-CM | POA: Diagnosis not present

## 2016-03-09 DIAGNOSIS — R262 Difficulty in walking, not elsewhere classified: Secondary | ICD-10-CM | POA: Diagnosis not present

## 2016-03-09 DIAGNOSIS — M25661 Stiffness of right knee, not elsewhere classified: Secondary | ICD-10-CM | POA: Diagnosis not present

## 2016-03-09 DIAGNOSIS — M25561 Pain in right knee: Secondary | ICD-10-CM | POA: Diagnosis not present

## 2016-03-11 DIAGNOSIS — M25561 Pain in right knee: Secondary | ICD-10-CM | POA: Diagnosis not present

## 2016-03-11 DIAGNOSIS — R262 Difficulty in walking, not elsewhere classified: Secondary | ICD-10-CM | POA: Diagnosis not present

## 2016-03-11 DIAGNOSIS — R2689 Other abnormalities of gait and mobility: Secondary | ICD-10-CM | POA: Diagnosis not present

## 2016-03-11 DIAGNOSIS — M25661 Stiffness of right knee, not elsewhere classified: Secondary | ICD-10-CM | POA: Diagnosis not present

## 2016-03-15 DIAGNOSIS — R2689 Other abnormalities of gait and mobility: Secondary | ICD-10-CM | POA: Diagnosis not present

## 2016-03-15 DIAGNOSIS — M25661 Stiffness of right knee, not elsewhere classified: Secondary | ICD-10-CM | POA: Diagnosis not present

## 2016-03-15 DIAGNOSIS — R262 Difficulty in walking, not elsewhere classified: Secondary | ICD-10-CM | POA: Diagnosis not present

## 2016-03-15 DIAGNOSIS — M25561 Pain in right knee: Secondary | ICD-10-CM | POA: Diagnosis not present

## 2016-03-16 DIAGNOSIS — Z471 Aftercare following joint replacement surgery: Secondary | ICD-10-CM | POA: Diagnosis not present

## 2016-03-16 DIAGNOSIS — Z96651 Presence of right artificial knee joint: Secondary | ICD-10-CM | POA: Diagnosis not present

## 2016-03-18 DIAGNOSIS — R262 Difficulty in walking, not elsewhere classified: Secondary | ICD-10-CM | POA: Diagnosis not present

## 2016-03-18 DIAGNOSIS — M25661 Stiffness of right knee, not elsewhere classified: Secondary | ICD-10-CM | POA: Diagnosis not present

## 2016-03-18 DIAGNOSIS — M25561 Pain in right knee: Secondary | ICD-10-CM | POA: Diagnosis not present

## 2016-03-18 DIAGNOSIS — R2689 Other abnormalities of gait and mobility: Secondary | ICD-10-CM | POA: Diagnosis not present

## 2016-03-23 DIAGNOSIS — M25661 Stiffness of right knee, not elsewhere classified: Secondary | ICD-10-CM | POA: Diagnosis not present

## 2016-03-23 DIAGNOSIS — R262 Difficulty in walking, not elsewhere classified: Secondary | ICD-10-CM | POA: Diagnosis not present

## 2016-03-23 DIAGNOSIS — M25561 Pain in right knee: Secondary | ICD-10-CM | POA: Diagnosis not present

## 2016-03-23 DIAGNOSIS — R2689 Other abnormalities of gait and mobility: Secondary | ICD-10-CM | POA: Diagnosis not present

## 2016-03-25 DIAGNOSIS — M25661 Stiffness of right knee, not elsewhere classified: Secondary | ICD-10-CM | POA: Diagnosis not present

## 2016-03-25 DIAGNOSIS — M25561 Pain in right knee: Secondary | ICD-10-CM | POA: Diagnosis not present

## 2016-03-25 DIAGNOSIS — R2689 Other abnormalities of gait and mobility: Secondary | ICD-10-CM | POA: Diagnosis not present

## 2016-03-25 DIAGNOSIS — R262 Difficulty in walking, not elsewhere classified: Secondary | ICD-10-CM | POA: Diagnosis not present

## 2016-03-30 DIAGNOSIS — R262 Difficulty in walking, not elsewhere classified: Secondary | ICD-10-CM | POA: Diagnosis not present

## 2016-03-30 DIAGNOSIS — M25561 Pain in right knee: Secondary | ICD-10-CM | POA: Diagnosis not present

## 2016-03-30 DIAGNOSIS — M25661 Stiffness of right knee, not elsewhere classified: Secondary | ICD-10-CM | POA: Diagnosis not present

## 2016-03-30 DIAGNOSIS — R2689 Other abnormalities of gait and mobility: Secondary | ICD-10-CM | POA: Diagnosis not present

## 2016-04-01 ENCOUNTER — Encounter: Payer: Self-pay | Admitting: Physician Assistant

## 2016-04-01 ENCOUNTER — Ambulatory Visit (INDEPENDENT_AMBULATORY_CARE_PROVIDER_SITE_OTHER): Payer: BLUE CROSS/BLUE SHIELD | Admitting: Physician Assistant

## 2016-04-01 VITALS — BP 128/78 | Temp 98.7°F | Resp 16 | Wt 212.0 lb

## 2016-04-01 DIAGNOSIS — M150 Primary generalized (osteo)arthritis: Secondary | ICD-10-CM

## 2016-04-01 DIAGNOSIS — E7800 Pure hypercholesterolemia, unspecified: Secondary | ICD-10-CM | POA: Diagnosis not present

## 2016-04-01 DIAGNOSIS — M8949 Other hypertrophic osteoarthropathy, multiple sites: Secondary | ICD-10-CM

## 2016-04-01 DIAGNOSIS — R2689 Other abnormalities of gait and mobility: Secondary | ICD-10-CM | POA: Diagnosis not present

## 2016-04-01 DIAGNOSIS — M25661 Stiffness of right knee, not elsewhere classified: Secondary | ICD-10-CM | POA: Diagnosis not present

## 2016-04-01 DIAGNOSIS — M25561 Pain in right knee: Secondary | ICD-10-CM | POA: Diagnosis not present

## 2016-04-01 DIAGNOSIS — R262 Difficulty in walking, not elsewhere classified: Secondary | ICD-10-CM | POA: Diagnosis not present

## 2016-04-01 DIAGNOSIS — I10 Essential (primary) hypertension: Secondary | ICD-10-CM | POA: Diagnosis not present

## 2016-04-01 DIAGNOSIS — M159 Polyosteoarthritis, unspecified: Secondary | ICD-10-CM

## 2016-04-01 MED ORDER — CELECOXIB 200 MG PO CAPS: 200.0000 mg | ORAL_CAPSULE | Freq: Every day | ORAL | 3 refills | Status: DC

## 2016-04-01 NOTE — Progress Notes (Deleted)
Patient: Leslie Koch Female    DOB: [birth year]   61 y.o.   MRN: [id number] Visit Date: 04/01/2016  Today's Provider: Mar Daring, [name]   Chief Complaint  Patient presents with  . Hypertension  . Hyperlipidemia   Subjective:    HPI  Hypertension, follow-up:  BP Readings from Last 3 Encounters:  04/01/16 128/78  02/13/16 (!) 145/87  02/04/16 129/78    She was last seen for hypertension 3 months ago.  BP at that visit was 128/80. Management since that visit includes no changes. She reports good compliance with treatment. She is not having side effects.  She is not exercising. She is adherent to low salt diet.   Outside blood pressures are not being checked. She is experiencing none.  Patient denies exertional chest pressure/discomfort and palpitations.   Cardiovascular risk factors include dyslipidemia.   Weight trend: stable Wt Readings from Last 3 Encounters:  04/01/16 212 lb (96.2 kg)  02/11/16 215 lb (97.5 kg)  02/04/16 215 lb (97.5 kg)    Current diet: well balanced     Lipid/Cholesterol, Follow-up:   Last seen for this3 months ago.  Management changes since that visit include no changes. . Last Lipid Panel: No results found for: CHOL, TRIG, HDL, CHOLHDL, VLDL, LDLCALC, LDLDIRECT  Risk factors for vascular disease include hypertension  She reports good compliance with treatment. She is not having side effects.  Current symptoms include none and have been stable. Weight trend: stable Prior visit with dietician: no Current diet: well balanced Current exercise: none  Wt Readings from Last 3 Encounters:  04/01/16 212 lb (96.2 kg)  02/11/16 215 lb (97.5 kg)  02/04/16 215 lb (97.5 kg)      Allergies  Allergen Reactions  . Montelukast Sodium Rash     Current Outpatient Prescriptions:  .  cetirizine (ZYRTEC ALLERGY) 10 MG tablet, Take 10 mg by mouth daily. , Disp: , Rfl:  .  gabapentin (NEURONTIN) 300 MG capsule, Take 1  capsule (300 mg total) by mouth 3 (three) times daily. Take a 300 mg capsule three times a day for one week, Then 300 mg capsule twice a day for one week, Then 300 mg capsule once a day for one week, then discontinue the Gabapentin., Disp: 42 capsule, Rfl: 0 .  lisinopril-hydrochlorothiazide (PRINZIDE,ZESTORETIC) 20-12.5 MG tablet, TAKE 1 TABLET DAILY, Disp: 90 tablet, Rfl: 3 .  methocarbamol (ROBAXIN) 500 MG tablet, Take 1 tablet (500 mg total) by mouth every 6 (six) hours as needed for muscle spasms., Disp: 80 tablet, Rfl: 0 .  Omeprazole 20 MG TBEC, Take 20 mg by mouth 2 (two) times daily. , Disp: , Rfl:  .  oxyCODONE (OXY IR/ROXICODONE) 5 MG immediate release tablet, Take 1-2 tablets (5-10 mg total) by mouth every 4 (four) hours as needed for moderate pain or severe pain., Disp: 84 tablet, Rfl: 0 .  oxymetazoline (AFRIN) 0.05 % nasal spray, Place 1-2 sprays into both nostrils 2 (two) times daily as needed for congestion., Disp: , Rfl:  .  ranitidine (ZANTAC) 150 MG tablet, Take 1 tablet (150 mg total) by mouth 2 (two) times daily as needed for heartburn., Disp: 60 tablet, Rfl: 0 .  rivaroxaban (XARELTO) 10 MG TABS tablet, Take 1 tablet (10 mg total) by mouth daily with breakfast. Take Xarelto for two and a half more weeks following discharge from the hospital, then discontinue Xarelto. Once the patient has completed the Xarelto, they may resume the 60  mg Aspirin., Disp: 19 tablet, Rfl: 0 .  senna-docusate (SENOKOT-S) 8.6-50 MG tablet, Take 1 tablet by mouth at bedtime., Disp: , Rfl:  .  simvastatin (ZOCOR) 40 MG tablet, Take 1 tablet (40 mg total) by mouth at bedtime., Disp: 90 tablet, Rfl: 3 .  traMADol (ULTRAM) 50 MG tablet, Take 1-2 tablets (50-100 mg total) by mouth every 6 (six) hours as needed (mild to moderate pain)., Disp: 56 tablet, Rfl: 0 .  valACYclovir (VALTREX) 1000 MG tablet, Take 1-2 tablets (1,000-2,000 mg total) by mouth every 12 (twelve) hours as needed. (Patient taking differently:  Take 1,000-2,000 mg by mouth every 12 (twelve) hours as needed (for cold sore/fever blisters.). ), Disp: 28 tablet, Rfl: 3  Review of Systems  Constitutional: Negative.   Respiratory: Negative.   Cardiovascular: Negative.   Musculoskeletal: Positive for arthralgias.  Neurological: Negative.     Social History  Substance Use Topics  . Smoking status: Former Smoker    Packs/day: 2.00    Years: 30.00    Types: Cigarettes  . Smokeless tobacco: Never Used     Comment: quit 04/14/1995  . Alcohol use 0.0 oz/week     Comment: occasionally 2-3 drinks once or twice a month   Objective:   BP 128/78 (BP Location: Left Arm, Patient Position: Sitting, Cuff Size: Normal)   Temp 98.7 F (37.1 C)   Resp 16   Wt 212 lb (96.2 kg)   BMI 38.78 kg/m  Vitals:   04/01/16 1601  BP: 128/78  Resp: 16  Temp: 98.7 F (37.1 C)  Weight: 212 lb (96.2 kg)     Physical Exam      Assessment & Plan:           Mar Daring, [name]  [facility]

## 2016-04-01 NOTE — Progress Notes (Signed)
Patient: Leslie Koch Female    DOB: 1955-01-31   61 y.o.   MRN: [id number] Visit Date: 04/13/2016  Today's Provider: Mar Daring, [name]   Chief Complaint  Patient presents with  . Hypertension  . Hyperlipidemia   Subjective:    HPI  Hypertension, follow-up:  BP Readings from Last 3 Encounters:  04/01/16 128/78  02/13/16 (!) 145/87  02/04/16 129/78    She was last seen for hypertension 1 months ago.  BP at that visit was 145/87. Management changes since that visit include none. She reports excellent compliance with treatment. She is not having side effects.  She is not exercising. She is adherent to low salt diet.   Outside blood pressures are stable. She is experiencing none.  Patient denies chest pain, claudication, dyspnea, exertional chest pressure/discomfort, fatigue, near-syncope and palpitations.   Cardiovascular risk factors include dyslipidemia, family history of premature cardiovascular disease and hypertension.  Use of agents associated with hypertension: none.     Weight trend: stable Wt Readings from Last 3 Encounters:  04/01/16 212 lb (96.2 kg)  02/11/16 215 lb (97.5 kg)  02/04/16 215 lb (97.5 kg)    Current diet: in general, a "healthy" diet    ------------------------------------------------------------------------   Lipid/Cholesterol, Follow-up:   Last seen for this6 months ago.  Management changes since that visit include none. . Last Lipid Panel: No results found for: CHOL, TRIG, HDL, CHOLHDL, VLDL, LDLCALC, LDLDIRECT  Risk factors for vascular disease include hypercholesterolemia and hypertension  She reports excellent compliance with treatment. She is not having side effects.  Current symptoms include none and have been stable. Weight trend: stable Prior visit with dietician: no Current diet: in general, a "healthy" diet   Current exercise:  none  -------------------------------------------------------------------     Allergies  Allergen Reactions  . Montelukast Sodium Rash     Current Outpatient Prescriptions:  .  cetirizine (ZYRTEC ALLERGY) 10 MG tablet, Take 10 mg by mouth daily. , Disp: , Rfl:  .  gabapentin (NEURONTIN) 300 MG capsule, Take 1 capsule (300 mg total) by mouth 3 (three) times daily. Take a 300 mg capsule three times a day for one week, Then 300 mg capsule twice a day for one week, Then 300 mg capsule once a day for one week, then discontinue the Gabapentin., Disp: 42 capsule, Rfl: 0 .  lisinopril-hydrochlorothiazide (PRINZIDE,ZESTORETIC) 20-12.5 MG tablet, TAKE 1 TABLET DAILY, Disp: 90 tablet, Rfl: 3 .  methocarbamol (ROBAXIN) 500 MG tablet, Take 1 tablet (500 mg total) by mouth every 6 (six) hours as needed for muscle spasms., Disp: 80 tablet, Rfl: 0 .  Omeprazole 20 MG TBEC, Take 20 mg by mouth 2 (two) times daily. , Disp: , Rfl:  .  oxyCODONE (OXY IR/ROXICODONE) 5 MG immediate release tablet, Take 1-2 tablets (5-10 mg total) by mouth every 4 (four) hours as needed for moderate pain or severe pain., Disp: 84 tablet, Rfl: 0 .  oxymetazoline (AFRIN) 0.05 % nasal spray, Place 1-2 sprays into both nostrils 2 (two) times daily as needed for congestion., Disp: , Rfl:  .  ranitidine (ZANTAC) 150 MG tablet, Take 1 tablet (150 mg total) by mouth 2 (two) times daily as needed for heartburn., Disp: 60 tablet, Rfl: 0 .  rivaroxaban (XARELTO) 10 MG TABS tablet, Take 1 tablet (10 mg total) by mouth daily with breakfast. Take Xarelto for two and a half more weeks following discharge from the hospital, then discontinue Xarelto. Once the patient  has completed the Xarelto, they may resume the 81 mg Aspirin., Disp: 19 tablet, Rfl: 0 .  senna-docusate (SENOKOT-S) 8.6-50 MG tablet, Take 1 tablet by mouth at bedtime., Disp: , Rfl:  .  simvastatin (ZOCOR) 40 MG tablet, Take 1 tablet (40 mg total) by mouth at bedtime., Disp: 90  tablet, Rfl: 3 .  traMADol (ULTRAM) 50 MG tablet, Take 1-2 tablets (50-100 mg total) by mouth every 6 (six) hours as needed (mild to moderate pain)., Disp: 56 tablet, Rfl: 0 .  valACYclovir (VALTREX) 1000 MG tablet, Take 1-2 tablets (1,000-2,000 mg total) by mouth every 12 (twelve) hours as needed. (Patient taking differently: Take 1,000-2,000 mg by mouth every 12 (twelve) hours as needed (for cold sore/fever blisters.). ), Disp: 28 tablet, Rfl: 3 .  celecoxib (CELEBREX) 200 MG capsule, Take 1 capsule (200 mg total) by mouth daily., Disp: 90 capsule, Rfl: 3  Review of Systems  Constitutional: Negative.   Respiratory: Negative.   Cardiovascular: Negative.   Gastrointestinal: Negative.   Musculoskeletal: Positive for arthralgias.  Neurological: Negative.     Social History  Substance Use Topics  . Smoking status: Former Smoker    Packs/day: 2.00    Years: 30.00    Types: Cigarettes  . Smokeless tobacco: Never Used     Comment: quit 04/14/1995  . Alcohol use 0.0 oz/week     Comment: occasionally 2-3 drinks once or twice a month   Objective:   BP 128/78 (BP Location: Left Arm, Patient Position: Sitting, Cuff Size: Normal)   Temp 98.7 F (37.1 C)   Resp 16   Wt 212 lb (96.2 kg)   BMI 38.78 kg/m  Vitals:   04/01/16 1601  BP: 128/78  Resp: 16  Temp: 98.7 F (37.1 C)  Weight: 212 lb (96.2 kg)     Physical Exam  Constitutional: She appears well-developed and well-nourished. No distress.  Neck: Normal range of motion. Neck supple. No tracheal deviation present. No thyromegaly present.  Cardiovascular: Normal rate, regular rhythm and normal heart sounds.  Exam reveals no gallop and no friction rub.   No murmur heard. Pulmonary/Chest: Effort normal and breath sounds normal. No respiratory distress. She has no wheezes. She has no rales.  Musculoskeletal: She exhibits no edema.  Lymphadenopathy:    She has no cervical adenopathy.  Skin: She is not diaphoretic.  Vitals  reviewed.      Assessment & Plan:     1. Primary osteoarthritis involving multiple joints Stable. Previously prescribed by ortho but is asking for me to take over Rx since she is having trouble getting in touch with them for refills. Refill provided below.  - celecoxib (CELEBREX) 200 MG capsule; Take 1 capsule (200 mg total) by mouth daily.  Dispense: 90 capsule; Refill: 3  2. Hypercholesteremia Stable. Continue Simvastatin 40mg .   3. Benign essential HTN Stable. Continue lisinopril-hctz 20-12.5mg .       Mar Daring, [name]  [facility]

## 2016-04-01 NOTE — Patient Instructions (Signed)
Celecoxib capsules  What is this medicine?  CELECOXIB (sell a KOX ib) is a non-steroidal anti-inflammatory drug (NSAID). This medicine is used to treat arthritis and ankylosing spondylitis. It may be also used for pain or painful monthly periods.  This medicine may be used for other purposes; ask your health care provider or pharmacist if you have questions.  COMMON BRAND NAME(S): Celebrex  What should I tell my health care provider before I take this medicine?  They need to know if you have any of these conditions:  -asthma  -coronary artery bypass graft (CABG) surgery within the past 2 weeks  -drink more than 3 alcohol-containing drinks a day  -heart disease or circulation problems like heart failure or leg edema (fluid retention)  -high blood pressure  -kidney disease  -liver disease  -stomach bleeding or ulcers  -an unusual or allergic reaction to celecoxib, sulfa drugs, aspirin, other NSAIDs, other medicines, foods, dyes, or preservatives  -pregnant or trying to get pregnant  -breast-feeding  How should I use this medicine?  Take this medicine by mouth with a full glass of water. Follow the directions on the prescription label. Take it with food if it upsets your stomach or if you take 400 mg at one time. Try to not lie down for at least 10 minutes after you take the medicine. Take the medicine at the same time each day. Do not take more medicine than you are told to take. Long-term, continuous use may increase the risk of heart attack or stroke.  A special MedGuide will be given to you by the pharmacist with each prescription and refill. Be sure to read this information carefully each time.  Talk to your pediatrician regarding the use of this medicine in children. Special care may be needed.  Overdosage: If you think you have taken too much of this medicine contact a poison control center or emergency room at once.  NOTE: This medicine is only for you. Do not share this medicine with others.  What if I miss  a dose?  If you miss a dose, take it as soon as you can. If it is almost time for your next dose, take only that dose. Do not take double or extra doses.  What may interact with this medicine?  Do not take this medicine with any of the following medications:  -cidofovir  -methotrexate  -other NSAIDs, medicines for pain and inflammation, like ibuprofen or naproxen  -pemetrexed  This medicine may also interact with the following medications:  -alcohol  -aspirin and aspirin-like drugs  -diuretics  -fluconazole  -lithium  -medicines for high blood pressure  -steroid medicines like prednisone or cortisone  -warfarin  This list may not describe all possible interactions. Give your health care provider a list of all the medicines, herbs, non-prescription drugs, or dietary supplements you use. Also tell them if you smoke, drink alcohol, or use illegal drugs. Some items may interact with your medicine.  What should I watch for while using this medicine?  Tell your doctor or health care professional if your pain does not get better. Talk to your doctor before taking another medicine for pain. Do not treat yourself.  This medicine does not prevent heart attack or stroke. In fact, this medicine may increase the chance of a heart attack or stroke. The chance may increase with longer use of this medicine and in people who have heart disease. If you take aspirin to prevent heart attack or stroke, talk with your   medicine. This medicine can cause ulcers and bleeding in the stomach and intestines at any time during treatment. Ulcers and bleeding can happen without warning symptoms and can cause death. What side effects may I notice from receiving this medicine? Side  effects that you should report to your doctor or health care professional as soon as possible: -allergic reactions like skin rash, itching or hives, swelling of the face, lips, or tongue -black or bloody stools, blood in the urine or vomit -blurred vision -breathing problems -chest pain -nausea, vomiting -problems with balance, talking, walking -redness, blistering, peeling or loosening of the skin, including inside the mouth -unexplained weight gain or swelling -unusually weak or tired -yellowing of eyes, skin Side effects that usually do not require medical attention (report to your doctor or health care professional if they continue or are bothersome): -constipation or diarrhea -dizziness -gas or heartburn -upset stomach This list may not describe all possible side effects. Call your doctor for medical advice about side effects. You may report side effects to FDA at 1-800-FDA-1088. Where should I keep my medicine? Keep out of the reach of children. Store at room temperature between 15 and 30 degrees C (59 and 86 degrees F). Keep container tightly closed. Throw away any unused medicine after the expiration date. NOTE: This sheet is a summary. It may not cover all possible information. If you have questions about this medicine, talk to your doctor, pharmacist, or health care provider.  2018 Elsevier/Gold Standard (2009-02-26 10:54:17)

## 2016-04-06 DIAGNOSIS — M25561 Pain in right knee: Secondary | ICD-10-CM | POA: Diagnosis not present

## 2016-04-06 DIAGNOSIS — M25661 Stiffness of right knee, not elsewhere classified: Secondary | ICD-10-CM | POA: Diagnosis not present

## 2016-04-06 DIAGNOSIS — R2689 Other abnormalities of gait and mobility: Secondary | ICD-10-CM | POA: Diagnosis not present

## 2016-04-06 DIAGNOSIS — R262 Difficulty in walking, not elsewhere classified: Secondary | ICD-10-CM | POA: Diagnosis not present

## 2016-04-08 DIAGNOSIS — R2689 Other abnormalities of gait and mobility: Secondary | ICD-10-CM | POA: Diagnosis not present

## 2016-04-08 DIAGNOSIS — R262 Difficulty in walking, not elsewhere classified: Secondary | ICD-10-CM | POA: Diagnosis not present

## 2016-04-08 DIAGNOSIS — M25561 Pain in right knee: Secondary | ICD-10-CM | POA: Diagnosis not present

## 2016-04-08 DIAGNOSIS — M25661 Stiffness of right knee, not elsewhere classified: Secondary | ICD-10-CM | POA: Diagnosis not present

## 2016-04-13 DIAGNOSIS — M25561 Pain in right knee: Secondary | ICD-10-CM | POA: Diagnosis not present

## 2016-04-13 DIAGNOSIS — M25661 Stiffness of right knee, not elsewhere classified: Secondary | ICD-10-CM | POA: Diagnosis not present

## 2016-04-13 DIAGNOSIS — R2689 Other abnormalities of gait and mobility: Secondary | ICD-10-CM | POA: Diagnosis not present

## 2016-04-15 DIAGNOSIS — R262 Difficulty in walking, not elsewhere classified: Secondary | ICD-10-CM | POA: Diagnosis not present

## 2016-04-15 DIAGNOSIS — M25561 Pain in right knee: Secondary | ICD-10-CM | POA: Diagnosis not present

## 2016-04-15 DIAGNOSIS — R2689 Other abnormalities of gait and mobility: Secondary | ICD-10-CM | POA: Diagnosis not present

## 2016-04-15 DIAGNOSIS — M25661 Stiffness of right knee, not elsewhere classified: Secondary | ICD-10-CM | POA: Diagnosis not present

## 2016-04-20 DIAGNOSIS — M1712 Unilateral primary osteoarthritis, left knee: Secondary | ICD-10-CM | POA: Diagnosis not present

## 2016-07-08 DIAGNOSIS — Z471 Aftercare following joint replacement surgery: Secondary | ICD-10-CM | POA: Diagnosis not present

## 2016-07-08 DIAGNOSIS — M1712 Unilateral primary osteoarthritis, left knee: Secondary | ICD-10-CM | POA: Diagnosis not present

## 2016-07-08 DIAGNOSIS — G8929 Other chronic pain: Secondary | ICD-10-CM | POA: Diagnosis not present

## 2016-07-08 DIAGNOSIS — M25561 Pain in right knee: Secondary | ICD-10-CM | POA: Diagnosis not present

## 2016-07-08 DIAGNOSIS — M25661 Stiffness of right knee, not elsewhere classified: Secondary | ICD-10-CM | POA: Diagnosis not present

## 2016-07-08 DIAGNOSIS — Z96651 Presence of right artificial knee joint: Secondary | ICD-10-CM | POA: Diagnosis not present

## 2016-07-21 ENCOUNTER — Encounter: Payer: Self-pay | Admitting: Physician Assistant

## 2016-07-21 ENCOUNTER — Ambulatory Visit (INDEPENDENT_AMBULATORY_CARE_PROVIDER_SITE_OTHER): Payer: BLUE CROSS/BLUE SHIELD | Admitting: Physician Assistant

## 2016-07-21 VITALS — BP 120/70 | HR 84 | Temp 98.2°F | Resp 16 | Ht 62.0 in | Wt 220.4 lb

## 2016-07-21 DIAGNOSIS — R601 Generalized edema: Secondary | ICD-10-CM

## 2016-07-21 DIAGNOSIS — R079 Chest pain, unspecified: Secondary | ICD-10-CM | POA: Diagnosis not present

## 2016-07-21 DIAGNOSIS — I10 Essential (primary) hypertension: Secondary | ICD-10-CM | POA: Diagnosis not present

## 2016-07-21 DIAGNOSIS — R0601 Orthopnea: Secondary | ICD-10-CM

## 2016-07-21 DIAGNOSIS — R0602 Shortness of breath: Secondary | ICD-10-CM

## 2016-07-21 MED ORDER — POTASSIUM CHLORIDE ER 10 MEQ PO TBCR: 10.0000 meq | EXTENDED_RELEASE_TABLET | Freq: Every day | ORAL | 0 refills | Status: DC

## 2016-07-21 MED ORDER — FUROSEMIDE 20 MG PO TABS: 20.0000 mg | ORAL_TABLET | Freq: Every day | ORAL | 0 refills | Status: DC

## 2016-07-21 NOTE — Progress Notes (Signed)
Patient: Leslie Koch Female    DOB: 1955/12/20   61 y.o.   MRN: [id number] Visit Date: 07/21/2016  Today's Provider: Mar Daring, [name]   Chief Complaint  Patient presents with  . Edema   Subjective:    HPI Edema: Patient complains of edema. The location of the edema is generalized.  The edema has been moderate.  Onset of symptoms was 1 month ago, gradually worsening since that time. The edema is present all day and worse during the night and morning. The swelling has been aggravated by nothing, relieved by nothing, and been associated with shortness of breath. Cardiac risk factors include dyslipidemia, hypertension and obesity (BMI >= 30 kg/m2). Per patient she had surgery to the right knee back in January and she thought the edema was coming from there. She saw Ortho  Last week for her knee and tendon hurting.  She reports that she feels SOB walking to her mother's house from her house, is about 2 minutes walks, also when she lays down she has SOB and walking long distance. Associated symptoms: Head tension, tingling on her feet and hands, feet are tight at night that hurts to walk, chest tightness,clothes fitting too tight. She has gain at least 10 lbs per patient. She is adherent to low salt diet. She also reports having a difficult time lying flat. She reports she has found herself sleeping in her recliner more often to keep her head elevated. When she lies flat she feels SOB.      Allergies  Allergen Reactions  . Montelukast Sodium Rash     Current Outpatient Prescriptions:  .  aspirin EC 81 MG tablet, Take by mouth., Disp: , Rfl:  .  Calcium Carbonate-Vitamin D (CALCIUM 500/D PO), Take by mouth., Disp: , Rfl:  .  celecoxib (CELEBREX) 200 MG capsule, Take 1 capsule (200 mg total) by mouth daily., Disp: 90 capsule, Rfl: 3 .  cetirizine (ZYRTEC ALLERGY) 10 MG tablet, Take 10 mg by mouth daily. , Disp: , Rfl:  .  Cholecalciferol (VITAMIN D3) 1000 units CAPS, Take by  mouth., Disp: , Rfl:  .  estrogens, conjugated, (PREMARIN) 0.45 MG tablet, Take by mouth., Disp: , Rfl:  .  Glucosamine-Chondroitin 750-600 MG TABS, Take by mouth., Disp: , Rfl:  .  lisinopril-hydrochlorothiazide (PRINZIDE,ZESTORETIC) 20-12.5 MG tablet, TAKE 1 TABLET DAILY, Disp: 90 tablet, Rfl: 3 .  Omega-3 Fatty Acids (FISH OIL) 1000 MG CAPS, Take by mouth., Disp: , Rfl:  .  Omeprazole 20 MG TBEC, Take 20 mg by mouth 2 (two) times daily. , Disp: , Rfl:  .  oxymetazoline (AFRIN) 0.05 % nasal spray, Place 1-2 sprays into both nostrils 2 (two) times daily as needed for congestion., Disp: , Rfl:  .  ranitidine (ZANTAC) 150 MG tablet, Take 1 tablet (150 mg total) by mouth 2 (two) times daily as needed for heartburn., Disp: 60 tablet, Rfl: 0 .  senna-docusate (SENOKOT-S) 8.6-50 MG tablet, Take 1 tablet by mouth at bedtime., Disp: , Rfl:  .  simvastatin (ZOCOR) 40 MG tablet, Take 1 tablet (40 mg total) by mouth at bedtime., Disp: 90 tablet, Rfl: 3 .  traMADol (ULTRAM) 50 MG tablet, Take 1-2 tablets (50-100 mg total) by mouth every 6 (six) hours as needed (mild to moderate pain)., Disp: 56 tablet, Rfl: 0 .  valACYclovir (VALTREX) 1000 MG tablet, Take 1-2 tablets (1,000-2,000 mg total) by mouth every 12 (twelve) hours as needed. (Patient taking differently: Take 1,000-2,000  mg by mouth every 12 (twelve) hours as needed (for cold sore/fever blisters.). ), Disp: 28 tablet, Rfl: 3  Review of Systems  Constitutional: Positive for fatigue.  HENT: Negative.   Eyes: Negative for visual disturbance.  Respiratory: Positive for shortness of breath. Negative for chest tightness and wheezing.   Cardiovascular: Positive for leg swelling. Negative for chest pain and palpitations.  Gastrointestinal: Negative.   Genitourinary: Negative.   Musculoskeletal: Negative.   Neurological: Positive for headaches. Negative for dizziness, weakness, light-headedness and numbness.    Social History  Substance Use Topics  .  Smoking status: Former Smoker    Packs/day: 2.00    Years: 30.00    Types: Cigarettes  . Smokeless tobacco: Never Used     Comment: quit 04/14/1995  . Alcohol use 0.0 oz/week     Comment: occasionally 2-3 drinks once or twice a month   Objective:   BP 120/70 (BP Location: Left Arm, Patient Position: Sitting, Cuff Size: Large)   Pulse 84   Temp 98.2 F (36.8 C) (Oral)   Resp 16   Ht 5\' 2"  (1.575 m)   Wt 220 lb 6.4 oz (100 kg)   BMI 40.31 kg/m  Vitals:   07/21/16 1639  BP: 120/70  Pulse: 84  Resp: 16  Temp: 98.2 F (36.8 C)  TempSrc: Oral  Weight: 220 lb 6.4 oz (100 kg)  Height: 5\' 2"  (1.575 m)     Physical Exam  Constitutional: She appears well-developed and well-nourished. No distress.  Neck: Normal range of motion. Neck supple. No JVD present. No tracheal deviation present. No thyromegaly present.  Cardiovascular: Normal rate, regular rhythm and normal heart sounds.  Exam reveals no gallop and no friction rub.   No murmur heard. Pulmonary/Chest: Effort normal and breath sounds normal. No respiratory distress. She has no wheezes. She has no rales.  Musculoskeletal: She exhibits edema (1+ pitting edema).  Lymphadenopathy:    She has no cervical adenopathy.  Skin: She is not diaphoretic.  Vitals reviewed.      Assessment & Plan:     1. SOB (shortness of breath) on exertion EKG showed NSR rate of 83. I will check labs as below. I am suspicious of early heart failure symptoms. I have ordered labs as below and I will f/u pending lab results. Furosemide given as below and potassium supplement given for her to take with furosemide. Discussed referral to cardiology for echocardiogram but patient wants to await and see how labs are and will then decide if she wants referral. She is to call if symptoms worsen.  - EKG 12-Lead - CBC w/Diff/Platelet - Basic Metabolic Panel (BMET) - B Nat Peptide - furosemide (LASIX) 20 MG tablet; Take 1 tablet (20 mg total) by mouth daily.   Dispense: 30 tablet; Refill: 0 - potassium chloride (K-DUR) 10 MEQ tablet; Take 1 tablet (10 mEq total) by mouth daily.  Dispense: 30 tablet; Refill: 0  2. Orthopnea See above medical treatment plan. - EKG 12-Lead - CBC w/Diff/Platelet - Basic Metabolic Panel (BMET) - B Nat Peptide - furosemide (LASIX) 20 MG tablet; Take 1 tablet (20 mg total) by mouth daily.  Dispense: 30 tablet; Refill: 0 - potassium chloride (K-DUR) 10 MEQ tablet; Take 1 tablet (10 mEq total) by mouth daily.  Dispense: 30 tablet; Refill: 0  3. Generalized edema See above medical treatment plan. - EKG 12-Lead - CBC w/Diff/Platelet - Basic Metabolic Panel (BMET) - B Nat Peptide - furosemide (LASIX) 20 MG tablet; Take 1  tablet (20 mg total) by mouth daily.  Dispense: 30 tablet; Refill: 0 - potassium chloride (K-DUR) 10 MEQ tablet; Take 1 tablet (10 mEq total) by mouth daily.  Dispense: 30 tablet; Refill: 0  4. Chest pain, unspecified type See above medical treatment plan. - EKG 12-Lead - CBC w/Diff/Platelet - Basic Metabolic Panel (BMET) - B Nat Peptide - furosemide (LASIX) 20 MG tablet; Take 1 tablet (20 mg total) by mouth daily.  Dispense: 30 tablet; Refill: 0 - potassium chloride (K-DUR) 10 MEQ tablet; Take 1 tablet (10 mEq total) by mouth daily.  Dispense: 30 tablet; Refill: 0  5. Benign essential HTN Stable. Continue lisinopril-hctz 20-12.5mg  daily. See above medical treatment plan. - CBC w/Diff/Platelet - Basic Metabolic Panel (BMET) - B Nat Peptide - furosemide (LASIX) 20 MG tablet; Take 1 tablet (20 mg total) by mouth daily.  Dispense: 30 tablet; Refill: 0 - potassium chloride (K-DUR) 10 MEQ tablet; Take 1 tablet (10 mEq total) by mouth daily.  Dispense: 30 tablet; Refill: 0       Mar Daring, [name]  [facility]

## 2016-07-22 DIAGNOSIS — R0602 Shortness of breath: Secondary | ICD-10-CM | POA: Diagnosis not present

## 2016-07-22 DIAGNOSIS — R601 Generalized edema: Secondary | ICD-10-CM | POA: Diagnosis not present

## 2016-07-22 DIAGNOSIS — R0601 Orthopnea: Secondary | ICD-10-CM | POA: Diagnosis not present

## 2016-07-22 DIAGNOSIS — R079 Chest pain, unspecified: Secondary | ICD-10-CM | POA: Diagnosis not present

## 2016-07-23 LAB — CBC WITH DIFFERENTIAL/PLATELET
BASOS ABS: 0.1 10*3/uL (ref 0.0–0.2)
Basos: 1 %
EOS (ABSOLUTE): 0.2 10*3/uL (ref 0.0–0.4)
EOS: 2 %
HEMATOCRIT: 40.7 % (ref 34.0–46.6)
HEMOGLOBIN: 13.7 g/dL (ref 11.1–15.9)
IMMATURE GRANULOCYTES: 0 %
Immature Grans (Abs): 0 10*3/uL (ref 0.0–0.1)
LYMPHS ABS: 3.1 10*3/uL (ref 0.7–3.1)
Lymphs: 32 %
MCH: 30.2 pg (ref 26.6–33.0)
MCHC: 33.7 g/dL (ref 31.5–35.7)
MCV: 90 fL (ref 79–97)
MONOCYTES: 6 %
Monocytes Absolute: 0.6 10*3/uL (ref 0.1–0.9)
NEUTROS PCT: 59 %
Neutrophils Absolute: 5.8 10*3/uL (ref 1.4–7.0)
Platelets: 332 10*3/uL (ref 150–379)
RBC: 4.54 x10E6/uL (ref 3.77–5.28)
RDW: 15.3 % (ref 12.3–15.4)
WBC: 9.7 10*3/uL (ref 3.4–10.8)

## 2016-07-23 LAB — BRAIN NATRIURETIC PEPTIDE: BNP: 17.2 pg/mL (ref 0.0–100.0)

## 2016-07-23 LAB — BASIC METABOLIC PANEL
BUN/Creatinine Ratio: 15 (ref 12–28)
BUN: 11 mg/dL (ref 8–27)
CO2: 19 mmol/L — AB (ref 20–29)
CREATININE: 0.72 mg/dL (ref 0.57–1.00)
Calcium: 9.5 mg/dL (ref 8.7–10.3)
Chloride: 102 mmol/L (ref 96–106)
GFR calc Af Amer: 105 mL/min/{1.73_m2} (ref >59)
GFR calc non Af Amer: 91 mL/min/{1.73_m2} (ref >59)
GLUCOSE: 101 mg/dL — AB (ref 65–99)
POTASSIUM: 4.6 mmol/L (ref 3.5–5.2)
SODIUM: 142 mmol/L (ref 134–144)

## 2016-08-16 ENCOUNTER — Telehealth: Payer: Self-pay | Admitting: Physician Assistant

## 2016-08-16 ENCOUNTER — Telehealth: Payer: Self-pay

## 2016-08-16 ENCOUNTER — Other Ambulatory Visit: Payer: Self-pay | Admitting: Physician Assistant

## 2016-08-16 DIAGNOSIS — I10 Essential (primary) hypertension: Secondary | ICD-10-CM

## 2016-08-16 DIAGNOSIS — R600 Localized edema: Secondary | ICD-10-CM

## 2016-08-16 DIAGNOSIS — R0602 Shortness of breath: Secondary | ICD-10-CM

## 2016-08-16 DIAGNOSIS — R079 Chest pain, unspecified: Secondary | ICD-10-CM

## 2016-08-16 NOTE — Telephone Encounter (Signed)
Needs appt

## 2016-08-16 NOTE — Telephone Encounter (Signed)
Left message advising pt. 

## 2016-08-16 NOTE — Telephone Encounter (Signed)
Pt called saying both legs are swollen, pain.  She is taking lasix and potassium.  Please advise  [phone]  Thanks Con Memos

## 2016-08-16 NOTE — Telephone Encounter (Signed)
Received New patient referral for SOBOE LE Edema and CP.    Patient wants much sooner than September appt   Added to waitlist   Referral is Routine

## 2016-08-16 NOTE — Telephone Encounter (Signed)
No answer. Left message to call back.   

## 2016-08-16 NOTE — Telephone Encounter (Signed)
Pt is requesting referral because the edema is not improved after starting diuretics. This was already worked up in July, and per pt labs and EKG were "perfect". Please advise.

## 2016-08-16 NOTE — Telephone Encounter (Signed)
Referral placed.

## 2016-08-17 ENCOUNTER — Telehealth: Payer: Self-pay | Admitting: Physician Assistant

## 2016-08-17 DIAGNOSIS — M7989 Other specified soft tissue disorders: Secondary | ICD-10-CM

## 2016-08-17 NOTE — Telephone Encounter (Signed)
I forgot to call her today before I left the office. Can we make sure it is not urgent? I will call first thing in the morning if that is ok. I apologize.

## 2016-08-17 NOTE — Telephone Encounter (Signed)
Pt needs Sonia Baller to call her regarding referral to cardio and about some medications that she is on.  Her call back is [phone] or her work phone is [phone]  Thanks teri

## 2016-08-17 NOTE — Telephone Encounter (Signed)
Patient advised as below. Patient reports that she just wants to talk to you about her cardio appointment. Patient reports that she does not want to wait to see cardio till 7 weeks. Patient reports she believes she needs to be seen soon. Also wants to know if she needs to continue taking furosemide. Patient reports it is okay for you to call her tomorrow morning. CB# [phone].

## 2016-08-18 NOTE — Telephone Encounter (Signed)
Spoke with [name].   Leslie Koch, Can we see if we can get her in sooner with cardiology?  I will also place referral to vein and vascular clinic to see if it is venous insuff. Vs lymphedema.

## 2016-08-20 ENCOUNTER — Other Ambulatory Visit: Payer: Self-pay | Admitting: Physician Assistant

## 2016-08-20 DIAGNOSIS — R601 Generalized edema: Secondary | ICD-10-CM

## 2016-08-20 DIAGNOSIS — R079 Chest pain, unspecified: Secondary | ICD-10-CM

## 2016-08-20 DIAGNOSIS — R0602 Shortness of breath: Secondary | ICD-10-CM

## 2016-08-20 DIAGNOSIS — I10 Essential (primary) hypertension: Secondary | ICD-10-CM

## 2016-08-20 DIAGNOSIS — R0601 Orthopnea: Secondary | ICD-10-CM

## 2016-08-24 ENCOUNTER — Telehealth: Payer: Self-pay

## 2016-08-24 DIAGNOSIS — R0602 Shortness of breath: Secondary | ICD-10-CM | POA: Insufficient documentation

## 2016-08-24 DIAGNOSIS — F17200 Nicotine dependence, unspecified, uncomplicated: Secondary | ICD-10-CM | POA: Insufficient documentation

## 2016-08-24 DIAGNOSIS — M7989 Other specified soft tissue disorders: Secondary | ICD-10-CM | POA: Insufficient documentation

## 2016-08-24 NOTE — Telephone Encounter (Signed)
Gollan had an opening for tomorrow .     Scheduled 8/15 at 3 pm.  Notified Sarah at [employer].

## 2016-08-24 NOTE — Telephone Encounter (Signed)
Patient has appt 08/25/16 with Dr Rockey Situ.

## 2016-08-24 NOTE — Telephone Encounter (Signed)
Cardiology referral rescheduled to see Dr Rockey Situ 08/25/16 at 3:00.LMTCB

## 2016-08-24 NOTE — Telephone Encounter (Signed)
Pt advised of appointment.

## 2016-08-24 NOTE — Telephone Encounter (Signed)
pcp wants patient seen sooner.  No cancellations seen on schedule for new patient at this time   Please advise if we can see them before 9/25

## 2016-08-24 NOTE — Progress Notes (Signed)
Cardiology Office Note  Date:  08/25/2016   ID:  Leslie Koch, DOB [birth year], MRN [id number]  PCP:  Mar Daring, [name]   Chief Complaint  Patient presents with  . other    per Dr Marlyn Corporal for SOBOE and LE Edema.. Meds reviewed verbally with patient.    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 Who presents by referral from [facility] for consultation of her shortness of breath and leg swelling  She reports that over the past several months she has had worsening leg swelling and shortness of breath on exertion Initially leg swelling on the right, felt it was secondary to knee replacement surgery 3 then developing leg swelling on the left, not to the extent of the right lower extremity  Weight has trended upwards, feels she is bloated with fluid She does have high fluid intake daily, spends much of her time sitting, does billing for [employer]  She has also noticed worsening shortness of breath, degree with exertion Has trouble walking long distances, has to stop and recover  Feels that she is also had some PND, orthopnea  Daytime somnolence Lots of snoring, tossing turning at nighttime but unclear if she has apnea  Weight is up at least 10 pounds since March 2018  EKG personally reviewed by myself on todays visit Normal sinus rhythm with rate 83 bpm no significant ST or T-wave changes    PMH:   has a past medical history of Arthritis; GERD (gastroesophageal reflux disease); Hypertension; and PONV (postoperative nausea and vomiting).  PSH:    Past Surgical History:  Procedure Laterality Date  . ABDOMINAL HYSTERECTOMY  01/1999  . CYST EXCISION  1974  . KNEE SURGERY Right 07/2010  . 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 TOTAL KNEE ARTHROPLASTY WITH REVISION COMPONENTS Right 02/11/2016   Procedure: RIGHT TOTAL KNEE ARTHROPLASTY  REVISION;  Surgeon: Gaynelle Arabian, MD;  Location: [facility];  Service: Orthopedics;  Laterality: Right;  . TUBAL LIGATION  1984    Current Outpatient Prescriptions  Medication Sig Dispense Refill  . aspirin EC 81 MG tablet Take by mouth.    . Calcium Carbonate-Vitamin D (CALCIUM 500/D PO) Take by mouth.    . celecoxib (CELEBREX) 200 MG capsule Take 1 capsule (200 mg total) by mouth daily. 90 capsule 3  . cetirizine (ZYRTEC ALLERGY) 10 MG tablet Take 10 mg by mouth daily.     . Cholecalciferol (VITAMIN D3) 1000 units CAPS Take by mouth.    . estrogens, conjugated, (PREMARIN) 0.45 MG tablet Take by mouth.    . furosemide (LASIX) 20 MG tablet TAKE 1 TABLET BY MOUTH EVERY DAY 30 tablet 0  . Glucosamine-Chondroitin 750-600 MG TABS Take by mouth.    Marland Kitchen lisinopril-hydrochlorothiazide (PRINZIDE,ZESTORETIC) 20-12.5 MG tablet TAKE 1 TABLET DAILY 90 tablet 1  . Omega-3 Fatty Acids (FISH OIL) 1000 MG CAPS Take by mouth.    . Omeprazole 20 MG TBEC Take 20 mg by mouth 2 (two) times daily.     Marland Kitchen oxymetazoline (AFRIN) 0.05 % nasal spray Place 1-2 sprays into both nostrils 2 (two) times daily as needed for congestion.    . potassium chloride (K-DUR) 10 MEQ tablet Take 1 tablet (10 mEq total) by mouth daily. 30 tablet 0  . ranitidine (ZANTAC) 150 MG tablet Take 1 tablet (150 mg total) by mouth 2 (two) times daily as needed for heartburn.  60 tablet 0  . senna-docusate (SENOKOT-S) 8.6-50 MG tablet Take 1 tablet by mouth at bedtime.    . simvastatin (ZOCOR) 40 MG tablet Take 1 tablet (40 mg total) by mouth at bedtime. 90 tablet 3  . traMADol (ULTRAM) 50 MG tablet Take 1-2 tablets (50-100 mg total) by mouth every 6 (six) hours as needed (mild to moderate pain). 56 tablet 0  . valACYclovir (VALTREX) 1000 MG tablet Take 1-2 tablets (1,000-2,000 mg total) by mouth every 12 (twelve) hours as needed. (Patient taking differently: Take 1,000-2,000 mg by mouth every 12 (twelve) hours as needed (for cold sore/fever blisters.).  ) 28 tablet 3   No current facility-administered medications for this visit.      Allergies:   Montelukast sodium   Social History:  The patient  reports that she has quit smoking. Her smoking use included Cigarettes. She has a 60.00 pack-year smoking history. She has never used smokeless tobacco. She reports that she drinks alcohol. She reports that she does not use drugs.   Family History:   family history includes Breast cancer in her paternal grandmother; Coronary artery disease in her maternal grandfather and mother; Diabetes in her mother; Hypertension in her mother; Lung cancer in her father; Stroke in her maternal grandmother.    Review of Systems: Review of Systems  Constitutional: Negative.        Weight gain  Respiratory: Positive for shortness of breath.   Cardiovascular: Positive for leg swelling.  Gastrointestinal: Negative.   Musculoskeletal: Negative.   Neurological: Negative.   Psychiatric/Behavioral: Negative.   All other systems reviewed and are negative.    PHYSICAL EXAM: VS:  BP 112/72 (BP Location: Left Arm, Patient Position: Sitting, Cuff Size: Normal)   Pulse 83   Ht 5\' 2"  (1.575 m)   Wt 223 lb 8 oz (101.4 kg)   SpO2 98%   BMI 40.88 kg/m  , BMI Body mass index is 40.88 kg/m. GEN: Well nourished, well developed, in no acute distress, obese  HEENT: normal  Neck: no JVD, carotid bruits, or masses Cardiac: RRR; no murmurs, rubs, or gallops, trace to 1+ nonpitting lower extremity edema  Respiratory:  clear to auscultation bilaterally, normal work of breathing GI: soft, nontender, nondistended, + BS MS: no deformity or atrophy  Skin: warm and dry, no rash Neuro:  Strength and sensation are intact Psych: euthymic mood, full affect    Recent Labs: 01/23/2016: TSH 1.850 02/04/2016: ALT 16 07/22/2016: BNP 17.2; BUN 11; Creatinine, Ser 0.72; Hemoglobin 13.7; Platelets 332; Potassium 4.6; Sodium 142    Lipid Panel No results found for: CHOL, HDL,  LDLCALC, TRIG    Wt Readings from Last 3 Encounters:  08/25/16 223 lb 8 oz (101.4 kg)  07/21/16 220 lb 6.4 oz (100 kg)  04/01/16 212 lb (96.2 kg)       ASSESSMENT AND PLAN:  SOB (shortness of breath) Etiology discussed with her, Prior smoking history, obesity, deconditioning Unable to exclude diastolic CHF Echocardiogram has been ordered Additional testing such as stress testing could be ordered if symptoms do not improve  Leg swelling Likely combination of venous insufficiency, edema from obesity, with possible component of edema from diastolic CHF Normal renal function on BMP Recommended she increase Lasix up to 40 mg daily, could try several days of 40 twice a day She will take potassium pill with every Lasix Echocardiogram to evaluate right heart pressures She is scheduled to see vein endovascular in one month time  Benign essential HTN Blood  pressure is well controlled on today's visit. No changes made to the medications.  Hypercholesteremia Currently on simvastatin 40 mg daily  Smoker Prior history of smoking at least 30 years Unable to exclude mild COPD as a contributor to her shortness of breath She has not tried inhalers in the past  Disposition:   F/U  3 months  Patient seen in consultation for [facility] and will be referred back to her office for ongoing care of the issues teeth above   Total encounter time more than 60 minutes  Greater than 50% was spent in counseling and coordination of care with the patient   No orders of the defined types were placed in this encounter.    Signed, Esmond Plants, M.D., Ph.D. 08/25/2016  [place], [place]

## 2016-08-25 ENCOUNTER — Ambulatory Visit (INDEPENDENT_AMBULATORY_CARE_PROVIDER_SITE_OTHER): Payer: BLUE CROSS/BLUE SHIELD | Admitting: Cardiovascular Disease

## 2016-08-25 ENCOUNTER — Encounter: Payer: Self-pay | Admitting: Cardiovascular Disease

## 2016-08-25 VITALS — BP 112/72 | HR 83 | Ht 62.0 in | Wt 223.5 lb

## 2016-08-25 DIAGNOSIS — R601 Generalized edema: Secondary | ICD-10-CM

## 2016-08-25 DIAGNOSIS — M7989 Other specified soft tissue disorders: Secondary | ICD-10-CM | POA: Diagnosis not present

## 2016-08-25 DIAGNOSIS — E7800 Pure hypercholesterolemia, unspecified: Secondary | ICD-10-CM | POA: Diagnosis not present

## 2016-08-25 DIAGNOSIS — R0601 Orthopnea: Secondary | ICD-10-CM | POA: Diagnosis not present

## 2016-08-25 DIAGNOSIS — R0602 Shortness of breath: Secondary | ICD-10-CM | POA: Diagnosis not present

## 2016-08-25 DIAGNOSIS — F17200 Nicotine dependence, unspecified, uncomplicated: Secondary | ICD-10-CM

## 2016-08-25 DIAGNOSIS — I10 Essential (primary) hypertension: Secondary | ICD-10-CM | POA: Diagnosis not present

## 2016-08-25 DIAGNOSIS — R079 Chest pain, unspecified: Secondary | ICD-10-CM | POA: Diagnosis not present

## 2016-08-25 MED ORDER — FUROSEMIDE 40 MG PO TABS: 40.0000 mg | ORAL_TABLET | Freq: Two times a day (BID) | ORAL | 4 refills | Status: DC | PRN

## 2016-08-25 MED ORDER — POTASSIUM CHLORIDE ER 10 MEQ PO TBCR: 10.0000 meq | EXTENDED_RELEASE_TABLET | Freq: Two times a day (BID) | ORAL | 4 refills | Status: AC | PRN

## 2016-08-25 NOTE — Patient Instructions (Addendum)
Medication Instructions:   Please take lasix 40 mg (once to twice a day) Take with potassium x 2  Labwork:  BMP in a couple weeks  Testing/Procedures:  We will order an echocardiogram for leg edema, SOB Your physician has requested that you have an echocardiogram. Echocardiography is a painless test that uses sound waves to create images of your heart. It provides your doctor with information about the size and shape of your heart and how well your heart's chambers and valves are working. This procedure takes approximately one hour. There are no restrictions for this procedure.  Follow-Up: It was a pleasure seeing you in the office today. Please call us if you have new issues that need to be addressed before your next appt.  [phone]  Your physician wants you to follow-up in: 2 to 3  months.   If you need a refill on your cardiac medications before your next appointment, please call your pharmacy.    Echocardiogram An echocardiogram, or echocardiography, uses sound waves (ultrasound) to produce an image of your heart. The echocardiogram is simple, painless, obtained within a short period of time, and offers valuable information to your health care provider. The images from an echocardiogram can provide information such as:  Evidence of coronary artery disease (CAD).  Heart size.  Heart muscle function.  Heart valve function.  Aneurysm detection.  Evidence of a past heart attack.  Fluid buildup around the heart.  Heart muscle thickening.  Assess heart valve function.  Tell a health care provider about:  Any allergies you have.  All medicines you are taking, including vitamins, herbs, eye drops, creams, and over-the-counter medicines.  Any problems you or family members have had with anesthetic medicines.  Any blood disorders you have.  Any surgeries you have had.  Any medical conditions you have.  Whether you are pregnant or may be pregnant. What  happens before the procedure? No special preparation is needed. Eat and drink normally. What happens during the procedure?  In order to produce an image of your heart, gel will be applied to your chest and a wand-like tool (transducer) will be moved over your chest. The gel will help transmit the sound waves from the transducer. The sound waves will harmlessly bounce off your heart to allow the heart images to be captured in real-time motion. These images will then be recorded.  You may need an IV to receive a medicine that improves the quality of the pictures. What happens after the procedure? You may return to your normal schedule including diet, activities, and medicines, unless your health care provider tells you otherwise. This information is not intended to replace advice given to you by your health care provider. Make sure you discuss any questions you have with your health care provider. Document Released: 12/26/1999 Document Revised: 08/16/2015 Document Reviewed: 09/04/2012 Elsevier Interactive Patient Education  2017 [employer].

## 2016-08-26 ENCOUNTER — Other Ambulatory Visit: Payer: BLUE CROSS/BLUE SHIELD

## 2016-08-27 ENCOUNTER — Other Ambulatory Visit: Payer: BLUE CROSS/BLUE SHIELD

## 2016-08-30 ENCOUNTER — Other Ambulatory Visit: Payer: Self-pay

## 2016-08-30 MED ORDER — ESTROGENS CONJUGATED 0.45 MG PO TABS: 0.4500 mg | ORAL_TABLET | Freq: Every day | ORAL | 1 refills | Status: DC

## 2016-08-30 NOTE — Telephone Encounter (Signed)
Patient calling needing a new prescription. Her husband accidentally threw it in the garbage. [facility]. She is requesting only 1 month supply.

## 2016-09-02 ENCOUNTER — Other Ambulatory Visit: Payer: Self-pay

## 2016-09-02 ENCOUNTER — Ambulatory Visit (INDEPENDENT_AMBULATORY_CARE_PROVIDER_SITE_OTHER): Payer: BLUE CROSS/BLUE SHIELD

## 2016-09-02 DIAGNOSIS — M7989 Other specified soft tissue disorders: Secondary | ICD-10-CM

## 2016-09-02 DIAGNOSIS — R079 Chest pain, unspecified: Secondary | ICD-10-CM | POA: Diagnosis not present

## 2016-09-02 DIAGNOSIS — R0602 Shortness of breath: Secondary | ICD-10-CM | POA: Diagnosis not present

## 2016-09-02 DIAGNOSIS — R601 Generalized edema: Secondary | ICD-10-CM | POA: Diagnosis not present

## 2016-09-14 ENCOUNTER — Telehealth: Payer: Self-pay | Admitting: Physician Assistant

## 2016-09-14 DIAGNOSIS — N951 Menopausal and female climacteric states: Secondary | ICD-10-CM

## 2016-09-14 MED ORDER — ESTRADIOL 0.5 MG PO TABS: 0.2500 mg | ORAL_TABLET | Freq: Every day | ORAL | 5 refills | Status: DC

## 2016-09-14 NOTE — Telephone Encounter (Signed)
Pt left message on voicemail requesting Leslie Koch to return her call to discuss her estrogens, conjugated, (PREMARIN) 0.45 MG tablet. Please advise. Thanks TNP

## 2016-09-14 NOTE — Telephone Encounter (Signed)
Premarin not covered. Will see is estrace generic covered. Patient advised and agreeable.

## 2016-09-20 ENCOUNTER — Telehealth: Payer: Self-pay | Admitting: Physician Assistant

## 2016-09-20 DIAGNOSIS — R232 Flushing: Secondary | ICD-10-CM

## 2016-09-20 NOTE — Telephone Encounter (Signed)
Pt called saying she is still having hot flashes.  Sh ehas been taking the medication that you gave her for a week now.    Please advise  [phone]  Thanks teri

## 2016-09-20 NOTE — Telephone Encounter (Signed)
She can increase to whole tab to see if any improvements noted.

## 2016-09-21 MED ORDER — ESTRADIOL 1 MG PO TABS: 1.0000 mg | ORAL_TABLET | Freq: Every day | ORAL | 0 refills | Status: DC

## 2016-09-21 NOTE — Telephone Encounter (Signed)
Patient reports that she has been taking a whole tablet since starting medication. Patient reports that she is having breakthrough hot flashes during the day. Patient reports hot flashes are worse during the night.

## 2016-09-21 NOTE — Addendum Note (Signed)
Addended by: Mar Daring on: 09/21/2016 11:39 AM   Modules accepted: Orders

## 2016-09-21 NOTE — Telephone Encounter (Signed)
Sent in 1mg  tab to [facility]. She can take 2 tabs of what she has left

## 2016-09-21 NOTE — Telephone Encounter (Signed)
Patient advised as below.  

## 2016-09-22 ENCOUNTER — Other Ambulatory Visit (INDEPENDENT_AMBULATORY_CARE_PROVIDER_SITE_OTHER): Payer: BLUE CROSS/BLUE SHIELD

## 2016-09-22 DIAGNOSIS — I10 Essential (primary) hypertension: Secondary | ICD-10-CM

## 2016-09-22 DIAGNOSIS — M7989 Other specified soft tissue disorders: Secondary | ICD-10-CM

## 2016-09-22 DIAGNOSIS — R0602 Shortness of breath: Secondary | ICD-10-CM | POA: Diagnosis not present

## 2016-09-22 DIAGNOSIS — R079 Chest pain, unspecified: Secondary | ICD-10-CM

## 2016-09-22 DIAGNOSIS — F17200 Nicotine dependence, unspecified, uncomplicated: Secondary | ICD-10-CM

## 2016-09-22 DIAGNOSIS — E7800 Pure hypercholesterolemia, unspecified: Secondary | ICD-10-CM

## 2016-09-22 DIAGNOSIS — R0601 Orthopnea: Secondary | ICD-10-CM

## 2016-09-22 DIAGNOSIS — R601 Generalized edema: Secondary | ICD-10-CM

## 2016-09-23 LAB — BASIC METABOLIC PANEL
BUN/Creatinine Ratio: 15 (ref 12–28)
BUN: 11 mg/dL (ref 8–27)
CO2: 23 mmol/L (ref 20–29)
Calcium: 9.4 mg/dL (ref 8.7–10.3)
Chloride: 104 mmol/L (ref 96–106)
Creatinine, Ser: 0.73 mg/dL (ref 0.57–1.00)
GFR calc Af Amer: 104 mL/min/{1.73_m2} (ref >59)
GFR calc non Af Amer: 90 mL/min/{1.73_m2} (ref >59)
Glucose: 106 mg/dL — ABNORMAL HIGH (ref 65–99)
Potassium: 4.7 mmol/L (ref 3.5–5.2)
Sodium: 143 mmol/L (ref 134–144)

## 2016-09-27 ENCOUNTER — Ambulatory Visit (INDEPENDENT_AMBULATORY_CARE_PROVIDER_SITE_OTHER): Payer: BLUE CROSS/BLUE SHIELD | Admitting: Vascular Surgery

## 2016-09-27 ENCOUNTER — Encounter (INDEPENDENT_AMBULATORY_CARE_PROVIDER_SITE_OTHER): Payer: Self-pay | Admitting: Vascular Surgery

## 2016-09-27 VITALS — BP 139/89 | HR 81 | Resp 16 | Ht 62.0 in | Wt 218.0 lb

## 2016-09-27 DIAGNOSIS — M7989 Other specified soft tissue disorders: Secondary | ICD-10-CM | POA: Diagnosis not present

## 2016-09-27 DIAGNOSIS — E7800 Pure hypercholesterolemia, unspecified: Secondary | ICD-10-CM | POA: Diagnosis not present

## 2016-09-27 DIAGNOSIS — I10 Essential (primary) hypertension: Secondary | ICD-10-CM

## 2016-09-27 NOTE — Progress Notes (Signed)
Subjective:    Patient ID: Leslie Koch, female    DOB: 12/26/1955, 61 y.o.   MRN: [id number] Chief Complaint  Patient presents with  . Leg Swelling   Presents as a new patient referred by Dr. Marlyn Corporal for "bilateral lower extremity edema". Patient states noticing swelling of her bilateral lower extremity starting in April 2018. She notes a dialysis right knee replacement 2. The patient notes the swelling is worse towards the end of the day. The swelling is also associated with a burning sensation. At this time, the patient does not wear compression stockings or elevate her legs heart level or higher. She denies any claudication rest pain or ulcerations to her bilateral lower extremity. Denies any fever, nausea or vomiting.   Review of Systems  Constitutional: Negative.   HENT: Negative.   Eyes: Negative.   Respiratory: Negative.   Cardiovascular: Positive for leg swelling.  Gastrointestinal: Negative.   Endocrine: Negative.   Genitourinary: Negative.   Musculoskeletal: Negative.   Skin: Negative.   Allergic/Immunologic: Negative.   Neurological: Negative.   Hematological: Negative.   Psychiatric/Behavioral: Negative.       Objective:   Physical Exam  Constitutional: She is oriented to person, place, and time. She appears well-developed and well-nourished. No distress.  HENT:  Head: Normocephalic and atraumatic.  Eyes: Pupils are equal, round, and reactive to light. Conjunctivae are normal.  Neck: Normal range of motion.  Cardiovascular: Normal rate, regular rhythm, normal heart sounds and intact distal pulses.   Pulses:      Radial pulses are 2+ on the right side, and 2+ on the left side.       Dorsalis pedis pulses are 2+ on the right side, and 2+ on the left side.       Posterior tibial pulses are 2+ on the right side, and 2+ on the left side.  Pulmonary/Chest: Effort normal.  Musculoskeletal: Normal range of motion. She exhibits edema (Moderate bilateral lower  extremity edema. No stasis dermatitis noted. ).  Neurological: She is alert and oriented to person, place, and time.  Skin: Skin is warm and dry. She is not diaphoretic.  Psychiatric: She has a normal mood and affect. Her behavior is normal. Judgment and thought content normal.  Vitals reviewed.  BP 139/89   Pulse 81   Resp 16   Ht 5\' 2"  (1.575 m)   Wt 218 lb (98.9 kg)   BMI 39.87 kg/m   Past Medical History:  Diagnosis Date  . Arthritis   . GERD (gastroesophageal reflux disease)   . Hypertension   . PONV (postoperative nausea and vomiting)     Social History   Social History  . Marital status: Married    Spouse name: N/A  . Number of children: N/A  . Years of education: N/A   Occupational History  . Not on file.   Social History Main Topics  . Smoking status: Former Smoker    Packs/day: 2.00    Years: 30.00    Types: Cigarettes  . Smokeless tobacco: Never Used     Comment: quit 04/14/1995  . Alcohol use 0.0 oz/week     Comment: occasionally 2-3 drinks once or twice a month  . Drug use: No  . Sexual activity: Yes   Other Topics Concern  . Not on file   Social History Narrative  . No narrative on file    Past Surgical History:  Procedure Laterality Date  . ABDOMINAL HYSTERECTOMY  01/1999  . CYST  EXCISION  1974  . KNEE SURGERY Right 07/2010  . 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 TOTAL KNEE ARTHROPLASTY WITH REVISION COMPONENTS Right 02/11/2016   Procedure: RIGHT TOTAL KNEE ARTHROPLASTY REVISION;  Surgeon: Gaynelle Arabian, MD;  Location: [facility];  Service: Orthopedics;  Laterality: Right;  . TUBAL LIGATION  1984    Family History  Problem Relation Age of Onset  . Hypertension Mother   . Diabetes Mother   . Coronary artery disease Mother   . Lung cancer Father   . Stroke Maternal Grandmother   . Coronary artery disease Maternal Grandfather   . Breast cancer Paternal Grandmother     Allergies  Allergen Reactions  . Montelukast Sodium Rash        Assessment & Plan:  Presents as a new patient referred by Dr. Marlyn Corporal for "bilateral lower extremity edema". Patient states noticing swelling of her bilateral lower extremity starting in April 2018. She notes a dialysis right knee replacement 2. The patient notes the swelling is worse towards the end of the day. The swelling is also associated with a burning sensation. At this time, the patient does not wear compression stockings or elevate her legs heart level or higher. She denies any claudication rest pain or ulcerations to her bilateral lower extremity. Denies any fever, nausea or vomiting.  1. Leg swelling - new The patient was encouraged to wear graduated compression stockings (20-30 mmHg) on a daily basis. The patient was instructed to begin wearing the stockings first thing in the morning and removing them in the evening. The patient was instructed specifically not to sleep in the stockings. Prescription given. In addition, behavioral modification including elevation during the day will be initiated. Anti-inflammatories for pain. The patient will follow up in one month to asses conservative management.  Information on chronic venous insufficiency and compression stockings was given to the patient. The patient was instructed to call the office in the interim if any worsening edema or ulcerations to the legs, feet or toes occurs. The patient expresses their understanding.  - VAS [place] LOWER EXTREMITY VENOUS REFLUX; Future  2. Benign essential HTN - stable Encouraged good control as its slows the progression of atherosclerotic disease  3. Hypercholesteremia - stable Encouraged good control as its slows the progression of atherosclerotic disease   Current Outpatient Prescriptions on File Prior to Visit  Medication Sig Dispense Refill  . aspirin EC 81 MG tablet Take by mouth.    . Calcium Carbonate-Vitamin D (CALCIUM 500/D PO) Take by mouth.    . celecoxib (CELEBREX) 200 MG capsule Take  1 capsule (200 mg total) by mouth daily. 90 capsule 3  . cetirizine (ZYRTEC ALLERGY) 10 MG tablet Take 10 mg by mouth daily.     . Cholecalciferol (VITAMIN D3) 1000 units CAPS Take by mouth.    . estradiol (ESTRACE) 1 MG tablet Take 1 tablet (1 mg total) by mouth daily. 30 tablet 0  . furosemide (LASIX) 40 MG tablet Take 1 tablet (40 mg total) by mouth 2 (two) times daily as needed. 60 tablet 4  . Glucosamine-Chondroitin 750-600 MG TABS Take by mouth.    Marland Kitchen lisinopril-hydrochlorothiazide (PRINZIDE,ZESTORETIC) 20-12.5 MG tablet TAKE 1 TABLET DAILY 90 tablet 1  . Omega-3 Fatty Acids (FISH OIL) 1000 MG CAPS Take by mouth.    . Omeprazole 20 MG TBEC Take 20 mg by mouth 2 (two) times daily.     Marland Kitchen oxymetazoline (AFRIN) 0.05 % nasal spray Place 1-2 sprays into both nostrils 2 (  two) times daily as needed for congestion.    . potassium chloride (K-DUR) 10 MEQ tablet Take 1 tablet (10 mEq total) by mouth 2 (two) times daily as needed. 60 tablet 4  . ranitidine (ZANTAC) 150 MG tablet Take 1 tablet (150 mg total) by mouth 2 (two) times daily as needed for heartburn. 60 tablet 0  . senna-docusate (SENOKOT-S) 8.6-50 MG tablet Take 1 tablet by mouth at bedtime.    . simvastatin (ZOCOR) 40 MG tablet Take 1 tablet (40 mg total) by mouth at bedtime. 90 tablet 3  . traMADol (ULTRAM) 50 MG tablet Take 1-2 tablets (50-100 mg total) by mouth every 6 (six) hours as needed (mild to moderate pain). 56 tablet 0  . valACYclovir (VALTREX) 1000 MG tablet Take 1-2 tablets (1,000-2,000 mg total) by mouth every 12 (twelve) hours as needed. (Patient taking differently: Take 1,000-2,000 mg by mouth every 12 (twelve) hours as needed (for cold sore/fever blisters.). ) 28 tablet 3   No current facility-administered medications on file prior to visit.     There are no Patient Instructions on file for this visit. No Follow-up on file.   Jazel Nimmons A Ercel Normoyle, [name]

## 2016-10-05 ENCOUNTER — Ambulatory Visit: Payer: BLUE CROSS/BLUE SHIELD | Admitting: Cardiovascular Disease

## 2016-10-27 ENCOUNTER — Ambulatory Visit (INDEPENDENT_AMBULATORY_CARE_PROVIDER_SITE_OTHER): Payer: BLUE CROSS/BLUE SHIELD

## 2016-10-27 ENCOUNTER — Ambulatory Visit (INDEPENDENT_AMBULATORY_CARE_PROVIDER_SITE_OTHER): Payer: BLUE CROSS/BLUE SHIELD | Admitting: Vascular Surgery

## 2016-10-27 ENCOUNTER — Encounter (INDEPENDENT_AMBULATORY_CARE_PROVIDER_SITE_OTHER): Payer: Self-pay | Admitting: Vascular Surgery

## 2016-10-27 VITALS — BP 139/85 | HR 69 | Resp 15 | Ht 62.0 in | Wt 220.0 lb

## 2016-10-27 DIAGNOSIS — I872 Venous insufficiency (chronic) (peripheral): Secondary | ICD-10-CM | POA: Insufficient documentation

## 2016-10-27 DIAGNOSIS — M7989 Other specified soft tissue disorders: Secondary | ICD-10-CM

## 2016-10-27 DIAGNOSIS — I890 Lymphedema, not elsewhere classified: Secondary | ICD-10-CM | POA: Insufficient documentation

## 2016-10-27 NOTE — Progress Notes (Signed)
Subjective:    Patient ID: Leslie Koch, female    DOB: 30-Aug-1955, 61 y.o.   MRN: [id number] Chief Complaint  Patient presents with  . Follow-up    Ultrasound fo/u   Patient presents to review vascular studies. Patient was last seen approximately one month ago. Since her initial visit, the patient has been engaging in conservative therapy including wearing medical grade 1 compression stockings, elevating her legs and remaining active. The patient's symptoms are stable. She has not seen much improvement since engaging in conservative therapy. She denies any fever, nausea or vomiting.The patient underwent a bilateral lower extremity venous reflux exam which was notable for no evidence of deep vein thrombosis of the bilateral lower extremities. No evidence of superficial thrombophlebitis of the bilateral lower extremities. An incompetence of the left greater saphenous vein.    Review of Systems  Constitutional: Negative.   HENT: Negative.   Eyes: Negative.   Respiratory: Negative.   Cardiovascular: Positive for leg swelling.  Gastrointestinal: Negative.   Endocrine: Negative.   Genitourinary: Negative.   Musculoskeletal: Negative.   Skin: Negative.   Allergic/Immunologic: Negative.   Neurological: Negative.   Hematological: Negative.   Psychiatric/Behavioral: Negative.       Objective:   Physical Exam  Constitutional: She is oriented to person, place, and time. She appears well-developed and well-nourished. No distress.  HENT:  Head: Normocephalic.  Eyes: Pupils are equal, round, and reactive to light. Conjunctivae are normal.  Neck: Normal range of motion.  Cardiovascular: Normal rate, regular rhythm, normal heart sounds and intact distal pulses.   Pulses:      Radial pulses are 2+ on the right side, and 2+ on the left side.  Hard to palpate pedal pulses due to body habitus and edema however her bilateral feet are warm  Pulmonary/Chest: Effort normal.  Musculoskeletal:  Normal range of motion. She exhibits edema (mild bilateral lower extremity edema noted).  Neurological: She is alert and oriented to person, place, and time.  Skin: Skin is warm and dry. She is not diaphoretic.  Psychiatric: She has a normal mood and affect. Her behavior is normal. Judgment and thought content normal.  Vitals reviewed.   BP 139/85 (BP Location: Right Arm)   Pulse 69   Resp 15   Ht 5\' 2"  (1.575 m)   Wt 220 lb (99.8 kg)   BMI 40.24 kg/m   Past Medical History:  Diagnosis Date  . Arthritis   . GERD (gastroesophageal reflux disease)   . Hypertension   . PONV (postoperative nausea and vomiting)     Social History   Social History  . Marital status: Married    Spouse name: N/A  . Number of children: N/A  . Years of education: N/A   Occupational History  . Not on file.   Social History Main Topics  . Smoking status: Former Smoker    Packs/day: 2.00    Years: 30.00    Types: Cigarettes  . Smokeless tobacco: Never Used     Comment: quit 04/14/1995  . Alcohol use 0.0 oz/week     Comment: occasionally 2-3 drinks once or twice a month  . Drug use: No  . Sexual activity: Yes   Other Topics Concern  . Not on file   Social History Narrative  . No narrative on file    Past Surgical History:  Procedure Laterality Date  . ABDOMINAL HYSTERECTOMY  01/1999  . CYST EXCISION  1974  . KNEE SURGERY Right 07/2010  .  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 TOTAL KNEE ARTHROPLASTY WITH REVISION COMPONENTS Right 02/11/2016   Procedure: RIGHT TOTAL KNEE ARTHROPLASTY REVISION;  Surgeon: Gaynelle Arabian, MD;  Location: [facility];  Service: Orthopedics;  Laterality: Right;  . TUBAL LIGATION  1984    Family History  Problem Relation Age of Onset  . Hypertension Mother   . Diabetes Mother   . Coronary artery disease Mother   . Lung cancer Father   . Stroke Maternal Grandmother   . Coronary artery disease Maternal Grandfather   . Breast cancer Paternal Grandmother      Allergies  Allergen Reactions  . Montelukast Sodium Rash       Assessment & Plan:  Patient presents to review vascular studies. Patient was last seen approximately one month ago. Since her initial visit, the patient has been engaging in conservative therapy including wearing medical grade 1 compression stockings, elevating her legs and remaining active. The patient's symptoms are stable. She has not seen much improvement since engaging in conservative therapy. She denies any fever, nausea or vomiting.The patient underwent a bilateral lower extremity venous reflux exam which was notable for no evidence of deep vein thrombosis of the bilateral lower extremities. No evidence of superficial thrombophlebitis of the bilateral lower extremities. An incompetence of the left greater saphenous vein.   1. Lymphedema - New Since her initial visit, the patient has been engaging in conservative therapy including wearing medical grade 1 compression stockings, elevating her legs and remaining active. The patient's symptoms are stable. She has not seen much improvement since engaging in conservative therapy.  We discussed the addition of a lymphedema pump as an added therapy At this time, the patient would like to think about it Patient will call our office if she would like to move forward  2. Chronic venous insufficiency - New Since her initial visit, the patient has been engaging in conservative therapy including wearing medical grade 1 compression stockings, elevating her legs and remaining active. The patient's symptoms are stable. She has not seen much improvement since engaging in conservative therapy.  We discussed laser ablation to the left greater saphenous vein At this time the patient would like to think about it The patient will call the office if she would like to move forward  Current Outpatient Prescriptions on File Prior to Visit  Medication Sig Dispense Refill  . aspirin EC 81 MG tablet  Take by mouth.    . Calcium Carbonate-Vitamin D (CALCIUM 500/D PO) Take by mouth.    . celecoxib (CELEBREX) 200 MG capsule Take 1 capsule (200 mg total) by mouth daily. 90 capsule 3  . cetirizine (ZYRTEC ALLERGY) 10 MG tablet Take 10 mg by mouth daily.     . Cholecalciferol (VITAMIN D3) 1000 units CAPS Take by mouth.    . furosemide (LASIX) 40 MG tablet Take 1 tablet (40 mg total) by mouth 2 (two) times daily as needed. 60 tablet 4  . Glucosamine-Chondroitin 750-600 MG TABS Take by mouth.    Marland Kitchen lisinopril-hydrochlorothiazide (PRINZIDE,ZESTORETIC) 20-12.5 MG tablet TAKE 1 TABLET DAILY 90 tablet 1  . Omega-3 Fatty Acids (FISH OIL) 1000 MG CAPS Take by mouth.    . Omeprazole 20 MG TBEC Take 20 mg by mouth 2 (two) times daily.     Marland Kitchen oxymetazoline (AFRIN) 0.05 % nasal spray Place 1-2 sprays into both nostrils 2 (two) times daily as needed for congestion.    . potassium chloride (K-DUR) 10 MEQ tablet Take 1 tablet (10 mEq  total) by mouth 2 (two) times daily as needed. 60 tablet 4  . ranitidine (ZANTAC) 150 MG tablet Take 1 tablet (150 mg total) by mouth 2 (two) times daily as needed for heartburn. 60 tablet 0  . senna-docusate (SENOKOT-S) 8.6-50 MG tablet Take 1 tablet by mouth at bedtime.    . simvastatin (ZOCOR) 40 MG tablet Take 1 tablet (40 mg total) by mouth at bedtime. 90 tablet 3  . traMADol (ULTRAM) 50 MG tablet Take 1-2 tablets (50-100 mg total) by mouth every 6 (six) hours as needed (mild to moderate pain). 56 tablet 0  . valACYclovir (VALTREX) 1000 MG tablet Take 1-2 tablets (1,000-2,000 mg total) by mouth every 12 (twelve) hours as needed. (Patient taking differently: Take 1,000-2,000 mg by mouth every 12 (twelve) hours as needed (for cold sore/fever blisters.). ) 28 tablet 3   No current facility-administered medications on file prior to visit.     There are no Patient Instructions on file for this visit. No Follow-up on file.   Gwenyth Dingee A Lander Eslick, [name]

## 2016-10-31 ENCOUNTER — Other Ambulatory Visit: Payer: Self-pay | Admitting: Physician Assistant

## 2016-10-31 DIAGNOSIS — R232 Flushing: Secondary | ICD-10-CM

## 2016-11-01 NOTE — Progress Notes (Deleted)
Cardiology Office Note  Date:  11/01/2016   ID:  Leslie Koch, Leslie Koch 1955-09-24, MRN [id number]  PCP:  Mar Daring, [name]   No chief complaint on file.   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 Who presents for follow-up of her shortness of breath and leg swelling  She reports that over the past several months she has had worsening leg swelling and shortness of breath on exertion Initially leg swelling on the right, felt it was secondary to knee replacement surgery 3 then developing leg swelling on the left, not to the extent of the right lower extremity  Weight has trended upwards, feels she is bloated with fluid She does have high fluid intake daily, spends much of her time sitting, does billing for [employer]  She has also noticed worsening shortness of breath, degree with exertion Has trouble walking long distances, has to stop and recover  Feels that she is also had some PND, orthopnea  Daytime somnolence Lots of snoring, tossing turning at nighttime but unclear if she has apnea  Weight is up at least 10 pounds since March 2018  EKG personally reviewed by myself on todays visit Normal sinus rhythm with rate 83 bpm no significant ST or T-wave changes    PMH:   has a past medical history of Arthritis; GERD (gastroesophageal reflux disease); Hypertension; and PONV (postoperative nausea and vomiting).  PSH:    Past Surgical History:  Procedure Laterality Date  . ABDOMINAL HYSTERECTOMY  01/1999  . CYST EXCISION  1974  . KNEE SURGERY Right 07/2010  . 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 TOTAL KNEE ARTHROPLASTY WITH REVISION COMPONENTS Right 02/11/2016   Procedure: RIGHT TOTAL KNEE ARTHROPLASTY REVISION;  Surgeon: Gaynelle Arabian, MD;  Location: [facility];  Service: Orthopedics;  Laterality: Right;  . TUBAL LIGATION  1984    Current  Outpatient Prescriptions  Medication Sig Dispense Refill  . aspirin EC 81 MG tablet Take by mouth.    . Calcium Carbonate-Vitamin D (CALCIUM 500/D PO) Take by mouth.    . celecoxib (CELEBREX) 200 MG capsule Take 1 capsule (200 mg total) by mouth daily. 90 capsule 3  . cetirizine (ZYRTEC ALLERGY) 10 MG tablet Take 10 mg by mouth daily.     . Cholecalciferol (VITAMIN D3) 1000 units CAPS Take by mouth.    . estradiol (ESTRACE) 0.5 MG tablet TAKE ONE-HALF TABLET BY MOUTH DAILY, MAY INCREASE TO 1 TABLET IF SYMPTOMS NOT FULLY CONTROLLED  5  . furosemide (LASIX) 40 MG tablet Take 1 tablet (40 mg total) by mouth 2 (two) times daily as needed. 60 tablet 4  . Glucosamine-Chondroitin 750-600 MG TABS Take by mouth.    Marland Kitchen lisinopril-hydrochlorothiazide (PRINZIDE,ZESTORETIC) 20-12.5 MG tablet TAKE 1 TABLET DAILY 90 tablet 1  . Multiple Vitamin (MULTIVITAMIN) tablet Take by mouth.    . Omega-3 Fatty Acids (FISH OIL) 1000 MG CAPS Take by mouth.    . OMEGA-3 FATTY ACIDS PO 2000 mgs by mouth twice a day    . Omeprazole 20 MG TBEC Take 20 mg by mouth 2 (two) times daily.     Marland Kitchen oxymetazoline (AFRIN) 0.05 % nasal spray Place 1-2 sprays into both nostrils 2 (two) times daily as needed for congestion.    . potassium chloride (K-DUR) 10 MEQ tablet Take 1 tablet (10 mEq total) by mouth 2 (two) times daily as needed. 60 tablet  4  . ranitidine (ZANTAC) 150 MG tablet Take 1 tablet (150 mg total) by mouth 2 (two) times daily as needed for heartburn. 60 tablet 0  . senna-docusate (SENOKOT-S) 8.6-50 MG tablet Take 1 tablet by mouth at bedtime.    . simvastatin (ZOCOR) 40 MG tablet Take 1 tablet (40 mg total) by mouth at bedtime. 90 tablet 3  . traMADol (ULTRAM) 50 MG tablet Take 1-2 tablets (50-100 mg total) by mouth every 6 (six) hours as needed (mild to moderate pain). 56 tablet 0  . valACYclovir (VALTREX) 1000 MG tablet Take 1-2 tablets (1,000-2,000 mg total) by mouth every 12 (twelve) hours as needed. (Patient taking  differently: Take 1,000-2,000 mg by mouth every 12 (twelve) hours as needed (for cold sore/fever blisters.). ) 28 tablet 3   No current facility-administered medications for this visit.      Allergies:   Montelukast sodium   Social History:  The patient  reports that she has quit smoking. Her smoking use included Cigarettes. She has a 60.00 pack-year smoking history. She has never used smokeless tobacco. She reports that she drinks alcohol. She reports that she does not use drugs.   Family History:   family history includes Breast cancer in her paternal grandmother; Coronary artery disease in her maternal grandfather and mother; Diabetes in her mother; Hypertension in her mother; Lung cancer in her father; Stroke in her maternal grandmother.    Review of Systems: Review of Systems  Constitutional: Negative.        Weight gain  Respiratory: Positive for shortness of breath.   Cardiovascular: Positive for leg swelling.  Gastrointestinal: Negative.   Musculoskeletal: Negative.   Neurological: Negative.   Psychiatric/Behavioral: Negative.   All other systems reviewed and are negative.    PHYSICAL EXAM: VS:  There were no vitals taken for this visit. , BMI There is no height or weight on file to calculate BMI. GEN: Well nourished, well developed, in no acute distress, obese  HEENT: normal  Neck: no JVD, carotid bruits, or masses Cardiac: RRR; no murmurs, rubs, or gallops, trace to 1+ nonpitting lower extremity edema  Respiratory:  clear to auscultation bilaterally, normal work of breathing GI: soft, nontender, nondistended, + BS MS: no deformity or atrophy  Skin: warm and dry, no rash Neuro:  Strength and sensation are intact Psych: euthymic mood, full affect    Recent Labs: 01/23/2016: TSH 1.850 02/04/2016: ALT 16 07/22/2016: BNP 17.2; Hemoglobin 13.7; Platelets 332 09/22/2016: BUN 11; Creatinine, Ser 0.73; Potassium 4.7; Sodium 143    Lipid Panel No results found for: CHOL,  HDL, LDLCALC, TRIG    Wt Readings from Last 3 Encounters:  10/27/16 220 lb (99.8 kg)  09/27/16 218 lb (98.9 kg)  08/25/16 223 lb 8 oz (101.4 kg)       ASSESSMENT AND PLAN:  SOB (shortness of breath) Etiology discussed with her, Prior smoking history, obesity, deconditioning Unable to exclude diastolic CHF Echocardiogram has been ordered Additional testing such as stress testing could be ordered if symptoms do not improve  Leg swelling Likely combination of venous insufficiency, edema from obesity, with possible component of edema from diastolic CHF Normal renal function on BMP Recommended she increase Lasix up to 40 mg daily, could try several days of 40 twice a day She will take potassium pill with every Lasix Echocardiogram to evaluate right heart pressures She is scheduled to see vein endovascular in one month time  Benign essential HTN Blood pressure is well controlled on today's  visit. No changes made to the medications.  Hypercholesteremia Currently on simvastatin 40 mg daily  Smoker Prior history of smoking at least 30 years Unable to exclude mild COPD as a contributor to her shortness of breath She has not tried inhalers in the past  Disposition:   F/U  3 months  Patient seen in consultation for Fenton Malling and will be referred back to her office for ongoing care of the issues teeth above   Total encounter time more than 60 minutes  Greater than 50% was spent in counseling and coordination of care with the patient   No orders of the defined types were placed in this encounter.    Signed, Esmond Plants, M.D., Ph.D. 11/01/2016  [place], [place]

## 2016-11-02 ENCOUNTER — Other Ambulatory Visit: Payer: Self-pay | Admitting: Physician Assistant

## 2016-11-02 DIAGNOSIS — R232 Flushing: Secondary | ICD-10-CM

## 2016-11-03 NOTE — Telephone Encounter (Signed)
I see patient is on estradiol 0.5mg  that was changed by vascular on 10/27/16. Is patient on 0.5mg  or 1mg ?

## 2016-11-03 NOTE — Telephone Encounter (Signed)
Patient states she is taking Estradiol 1 mg.

## 2016-11-04 ENCOUNTER — Ambulatory Visit: Payer: BLUE CROSS/BLUE SHIELD | Admitting: Cardiovascular Disease

## 2016-11-05 ENCOUNTER — Encounter: Payer: Self-pay | Admitting: Physician Assistant

## 2016-11-05 ENCOUNTER — Ambulatory Visit (INDEPENDENT_AMBULATORY_CARE_PROVIDER_SITE_OTHER): Payer: BLUE CROSS/BLUE SHIELD | Admitting: Physician Assistant

## 2016-11-05 VITALS — BP 122/70 | HR 60 | Temp 98.0°F | Resp 16 | Wt 220.0 lb

## 2016-11-05 DIAGNOSIS — B029 Zoster without complications: Secondary | ICD-10-CM

## 2016-11-05 MED ORDER — VALACYCLOVIR HCL 1 G PO TABS: 1000.0000 mg | ORAL_TABLET | Freq: Three times a day (TID) | ORAL | 0 refills | Status: DC

## 2016-11-05 NOTE — Patient Instructions (Signed)
Shingles Shingles, which is also known as herpes zoster, is an infection that causes a painful skin rash and fluid-filled blisters. Shingles is not related to genital herpes, which is a sexually transmitted infection. Shingles only develops in people who:  Have had chickenpox.  Have received the chickenpox vaccine. (This is rare.)  What are the causes? Shingles is caused by varicella-zoster virus (VZV). This is the same virus that causes chickenpox. After exposure to VZV, the virus stays in the body in an inactive (dormant) state. Shingles develops if the virus reactivates. This can happen many years after the initial exposure to VZV. It is not known what causes this virus to reactivate. What increases the risk? People who have had chickenpox or received the chickenpox vaccine are at risk for shingles. Infection is more common in people who:  Are older than age 50.  Have a weakened defense (immune) system, such as those with HIV, AIDS, or cancer.  Are taking medicines that weaken the immune system, such as transplant medicines.  Are under great stress.  What are the signs or symptoms? Early symptoms of this condition include itching, tingling, and pain in an area on your skin. Pain may be described as burning, stabbing, or throbbing. A few days or weeks after symptoms start, a painful red rash appears, usually on one side of the body in a bandlike or beltlike pattern. The rash eventually turns into fluid-filled blisters that break open, scab over, and dry up in about 2-3 weeks. At any time during the infection, you may also develop:  A fever.  Chills.  A headache.  An upset stomach.  How is this diagnosed? This condition is diagnosed with a skin exam. Sometimes, skin or fluid samples are taken from the blisters before a diagnosis is made. These samples are examined under a microscope or sent to a lab for testing. How is this treated? There is no specific cure for this condition.  Your health care provider will probably prescribe medicines to help you manage pain, recover more quickly, and avoid long-term problems. Medicines may include:  Antiviral drugs.  Anti-inflammatory drugs.  Pain medicines.  If the area involved is on your face, you may be referred to a specialist, such as an eye doctor (ophthalmologist) or an ear, nose, and throat (ENT) doctor to help you avoid eye problems, chronic pain, or disability. Follow these instructions at home: Medicines  Take medicines only as directed by your health care provider.  Apply an anti-itch or numbing cream to the affected area as directed by your health care provider. Blister and Rash Care  Take a cool bath or apply cool compresses to the area of the rash or blisters as directed by your health care provider. This may help with pain and itching.  Keep your rash covered with a loose bandage (dressing). Wear loose-fitting clothing to help ease the pain of material rubbing against the rash.  Keep your rash and blisters clean with mild soap and cool water or as directed by your health care provider.  Check your rash every day for signs of infection. These include redness, swelling, and pain that lasts or increases.  Do not pick your blisters.  Do not scratch your rash. General instructions  Rest as directed by your health care provider.  Keep all follow-up visits as directed by your health care provider. This is important.  Until your blisters scab over, your infection can cause chickenpox in people who have never had it or been vaccinated   against it. To prevent this from happening, avoid contact with other people, especially: ? Babies. ? Pregnant women. ? Children who have eczema. ? Elderly people who have transplants. ? People who have chronic illnesses, such as leukemia or AIDS. Contact a health care provider if:  Your pain is not relieved with prescribed medicines.  Your pain does not get better after  the rash heals.  Your rash looks infected. Signs of infection include redness, swelling, and pain that lasts or increases. Get help right away if:  The rash is on your face or nose.  You have facial pain, pain around your eye area, or loss of feeling on one side of your face.  You have ear pain or you have ringing in your ear.  You have loss of taste.  Your condition gets worse. This information is not intended to replace advice given to you by your health care provider. Make sure you discuss any questions you have with your health care provider. Document Released: 12/28/2004 Document Revised: 08/24/2015 Document Reviewed: 11/08/2013 Elsevier Interactive Patient Education  2017 [employer]  

## 2016-11-05 NOTE — Progress Notes (Signed)
Patient: Leslie Koch Female    DOB: 1955-01-29   61 y.o.   MRN: [id number] Visit Date: 11/05/2016  Today's Provider: Mar Daring, [name]   Chief Complaint  Patient presents with  . Rash   Subjective:    HPI Rash: Patient complains of rash involving the left arm. Rash started 1 week ago. Appearance of rash at onset: Color of lesion(s): pink and purple. Rash has changed over time Initial distribution: left arm pit and left breast.  Discomfort associated with rash: is painful and is pruritic.  Associated symptoms: headache. Denies: fever. Patient has not had previous evaluation of rash. Patient has had previous treatment.  Response to treatment: Prednisone started yesterday 6 day taper, pt reports being seen by PA at [facility] . Patient has not had contacts with similar rash. Patient has not identified precipitant. Patient has not had new exposures (soaps, lotions, laundry detergents, foods, medications, plants, insects or animals.)     Allergies  Allergen Reactions  . Montelukast Sodium Rash     Current Outpatient Prescriptions:  .  aspirin EC 81 MG tablet, Take by mouth., Disp: , Rfl:  .  Calcium Carbonate-Vitamin D (CALCIUM 500/D PO), Take by mouth., Disp: , Rfl:  .  celecoxib (CELEBREX) 200 MG capsule, Take 1 capsule (200 mg total) by mouth daily., Disp: 90 capsule, Rfl: 3 .  cetirizine (ZYRTEC ALLERGY) 10 MG tablet, Take 10 mg by mouth daily. , Disp: , Rfl:  .  Cholecalciferol (VITAMIN D3) 1000 units CAPS, Take by mouth., Disp: , Rfl:  .  estradiol (ESTRACE) 1 MG tablet, TAKE 1 TABLET BY MOUTH EVERY DAY, Disp: 90 tablet, Rfl: 1 .  furosemide (LASIX) 40 MG tablet, Take 1 tablet (40 mg total) by mouth 2 (two) times daily as needed., Disp: 60 tablet, Rfl: 4 .  Glucosamine-Chondroitin 750-600 MG TABS, Take by mouth., Disp: , Rfl:  .  lisinopril-hydrochlorothiazide (PRINZIDE,ZESTORETIC) 20-12.5 MG tablet, TAKE 1 TABLET DAILY, Disp: 90 tablet, Rfl: 1 .   Multiple Vitamin (MULTIVITAMIN) tablet, Take by mouth., Disp: , Rfl:  .  Omega-3 Fatty Acids (FISH OIL) 1000 MG CAPS, Take by mouth., Disp: , Rfl:  .  OMEGA-3 FATTY ACIDS PO, 2000 mgs by mouth twice a day, Disp: , Rfl:  .  Omeprazole 20 MG TBEC, Take 20 mg by mouth 2 (two) times daily. , Disp: , Rfl:  .  oxymetazoline (AFRIN) 0.05 % nasal spray, Place 1-2 sprays into both nostrils 2 (two) times daily as needed for congestion., Disp: , Rfl:  .  potassium chloride (K-DUR) 10 MEQ tablet, Take 1 tablet (10 mEq total) by mouth 2 (two) times daily as needed., Disp: 60 tablet, Rfl: 4 .  predniSONE (STERAPRED UNI-PAK 21 TAB) 10 MG (21) TBPK tablet, , Disp: , Rfl:  .  ranitidine (ZANTAC) 150 MG tablet, Take 1 tablet (150 mg total) by mouth 2 (two) times daily as needed for heartburn., Disp: 60 tablet, Rfl: 0 .  senna-docusate (SENOKOT-S) 8.6-50 MG tablet, Take 1 tablet by mouth at bedtime., Disp: , Rfl:  .  simvastatin (ZOCOR) 40 MG tablet, Take 1 tablet (40 mg total) by mouth at bedtime., Disp: 90 tablet, Rfl: 3 .  traMADol (ULTRAM) 50 MG tablet, Take 1-2 tablets (50-100 mg total) by mouth every 6 (six) hours as needed (mild to moderate pain)., Disp: 56 tablet, Rfl: 0 .  valACYclovir (VALTREX) 1000 MG tablet, Take 1-2 tablets (1,000-2,000 mg total) by mouth every 12 (  twelve) hours as needed. (Patient not taking: Reported on 11/05/2016), Disp: 28 tablet, Rfl: 3  Review of Systems  Constitutional: Negative.   Respiratory: Negative.   Cardiovascular: Negative.   Skin: Positive for rash.  Neurological: Negative.     Social History  Substance Use Topics  . Smoking status: Former Smoker    Packs/day: 2.00    Years: 30.00    Types: Cigarettes  . Smokeless tobacco: Never Used     Comment: quit 04/14/1995  . Alcohol use 0.0 oz/week     Comment: occasionally 2-3 drinks once or twice a month   Objective:   BP 122/70 (BP Location: Left Arm, Patient Position: Sitting, Cuff Size: Large)   Pulse 60    Temp 98 F (36.7 C) (Oral)   Resp 16   Wt 220 lb (99.8 kg)   SpO2 99%   BMI 40.24 kg/m  Vitals:   11/05/16 1058  BP: 122/70  Pulse: 60  Resp: 16  Temp: 98 F (36.7 C)  TempSrc: Oral  SpO2: 99%  Weight: 220 lb (99.8 kg)     Physical Exam  Constitutional: She appears well-developed and well-nourished. No distress.  Neck: Normal range of motion. Neck supple. No JVD present. No tracheal deviation present. No thyromegaly present.  Cardiovascular: Normal rate, regular rhythm and normal heart sounds.  Exam reveals no gallop and no friction rub.   No murmur heard. Pulmonary/Chest: Effort normal and breath sounds normal. No respiratory distress. She has no wheezes. She has no rales.  Lymphadenopathy:    She has no cervical adenopathy.  Skin: She is not diaphoretic.     Vitals reviewed.       Assessment & Plan:     1. Herpes zoster without complication Will treat with Valtrex as below. She is to call if symptoms worsen. - valACYclovir (VALTREX) 1000 MG tablet; Take 1 tablet (1,000 mg total) by mouth 3 (three) times daily.  Dispense: 21 tablet; Refill: 0       Mar Daring, [name]  [facility]

## 2016-12-28 ENCOUNTER — Other Ambulatory Visit: Payer: Self-pay | Admitting: Physician Assistant

## 2016-12-28 DIAGNOSIS — B029 Zoster without complications: Secondary | ICD-10-CM

## 2017-01-07 ENCOUNTER — Other Ambulatory Visit: Payer: Self-pay | Admitting: Physician Assistant

## 2017-01-07 DIAGNOSIS — M159 Polyosteoarthritis, unspecified: Secondary | ICD-10-CM

## 2017-01-07 DIAGNOSIS — I10 Essential (primary) hypertension: Secondary | ICD-10-CM

## 2017-01-07 DIAGNOSIS — E7800 Pure hypercholesterolemia, unspecified: Secondary | ICD-10-CM

## 2017-01-07 DIAGNOSIS — M8949 Other hypertrophic osteoarthropathy, multiple sites: Secondary | ICD-10-CM

## 2017-01-07 DIAGNOSIS — M150 Primary generalized (osteo)arthritis: Secondary | ICD-10-CM

## 2017-02-17 ENCOUNTER — Telehealth: Payer: Self-pay

## 2017-02-17 DIAGNOSIS — J069 Acute upper respiratory infection, unspecified: Secondary | ICD-10-CM

## 2017-02-17 NOTE — Telephone Encounter (Signed)
Patient called saying that she has had cough, body aches, congestion, fever up to 101.6 for the last 2 days. 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 She is requesting that we send in an abx to help with her symptoms. I advised patient that she may need to be seen, but she wanted me to send a message. Patient reports that she has tried Mucinex OTC, delsym, and tylenol for her fever. [facility] [place] [facility] Contact info is correct. Thanks!

## 2017-02-18 MED ORDER — AMOXICILLIN-POT CLAVULANATE 875-125 MG PO TABS: 1.0000 | ORAL_TABLET | Freq: Two times a day (BID) | ORAL | 0 refills | Status: DC

## 2017-02-18 NOTE — Telephone Encounter (Signed)
Patient advised as directed below.  Thanks,  -Tarez Bowns 

## 2017-02-18 NOTE — Telephone Encounter (Signed)
Augmentin sent in but if she gets worse or if she does not improve she needs to be seen here

## 2017-02-21 ENCOUNTER — Ambulatory Visit: Payer: BLUE CROSS/BLUE SHIELD | Admitting: Family Medicine

## 2017-02-21 ENCOUNTER — Encounter: Payer: Self-pay | Admitting: Family Medicine

## 2017-02-21 ENCOUNTER — Other Ambulatory Visit: Payer: Self-pay | Admitting: Family Medicine

## 2017-02-21 VITALS — BP 118/68 | HR 80 | Temp 98.1°F | Resp 20 | Wt 217.0 lb

## 2017-02-21 DIAGNOSIS — B349 Viral infection, unspecified: Secondary | ICD-10-CM

## 2017-02-21 MED ORDER — HYDROCODONE-HOMATROPINE 5-1.5 MG/5ML PO SYRP: ORAL_SOLUTION | ORAL | 0 refills | Status: DC

## 2017-02-21 MED ORDER — PREDNISONE 20 MG PO TABS: ORAL_TABLET | ORAL | 0 refills | Status: DC

## 2017-02-21 NOTE — Progress Notes (Signed)
Subjective:     Patient ID: MALY LEMARR, female   DOB: 1956/01/11, 62 y.o.   MRN: [id number] Chief Complaint  Patient presents with  . URI    Started 02/15/2017 (Flu test negative)   HPI States she continues to have chest congestion with minimally productive cough. Fevers have abated. Was placed on Augmentin on 2/8 but states she discontinued it due to diarrhea.  Review of Systems     Objective:   Physical Exam  Constitutional: She appears well-developed and well-nourished. She appears distressed (frequent cough).  Ears: T.M's intact without inflammation Throat: no tonsillar enlargement or exudate Neck: no cervical adenopathy Lungs: posterior fields with end-expiratory wheeze.     Assessment:    1. Acute viral syndrome - HYDROcodone-homatropine (HYCODAN) 5-1.5 MG/5ML syrup; 5 ml 4-6 hours as needed for cough  Dispense: 120 mL; Refill: 0 - predniSONE (DELTASONE) 20 MG tablet; One pill twice daily for 5 days  Dispense: 10 tablet; Refill: 0    Plan:    Continue Mucinex. Work excuse for 2/6 to 02/23/17.

## 2017-02-21 NOTE — Patient Instructions (Signed)
Continue Mucinex.

## 2017-03-16 ENCOUNTER — Other Ambulatory Visit: Payer: Self-pay | Admitting: Cardiovascular Disease

## 2017-04-15 ENCOUNTER — Other Ambulatory Visit: Payer: Self-pay | Admitting: Physician Assistant

## 2017-04-15 DIAGNOSIS — R232 Flushing: Secondary | ICD-10-CM

## 2017-05-09 ENCOUNTER — Telehealth: Payer: Self-pay | Admitting: Physician Assistant

## 2017-05-09 NOTE — Telephone Encounter (Signed)
Advised patient to topical benadryl cream with hydrocortisone cream together or benadryl orally.  She says she has tried the cream and it had not helped, but she will try the pill and see how that helps.  Advised to call back if no improvement.

## 2017-05-09 NOTE — Telephone Encounter (Signed)
She can try topical benadryl cream with hydrocortisone cream together or benadryl orally. We could use hydroxyzine if benadryl does not work but it can make more drowsy than benadryl
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
## 2017-05-13 DIAGNOSIS — M1712 Unilateral primary osteoarthritis, left knee: Secondary | ICD-10-CM | POA: Diagnosis not present

## 2017-05-16 ENCOUNTER — Telehealth: Payer: Self-pay | Admitting: Physician Assistant

## 2017-05-16 NOTE — Telephone Encounter (Signed)
[facility]. Patient advised.

## 2017-05-16 NOTE — Telephone Encounter (Signed)
Pt needs refill on her Estradiol 1 mg  [facility] [place] [phone]  Thanks Con Memos

## 2017-06-08 ENCOUNTER — Other Ambulatory Visit: Payer: Self-pay | Admitting: Physician Assistant

## 2017-06-08 DIAGNOSIS — Z1231 Encounter for screening mammogram for malignant neoplasm of breast: Secondary | ICD-10-CM

## 2017-06-16 ENCOUNTER — Ambulatory Visit
Admission: RE | Admit: 2017-06-16 | Discharge: 2017-06-16 | Disposition: A | Discharge status: Home / Self Care | Payer: BLUE CROSS/BLUE SHIELD | Source: Ambulatory Visit | Attending: Physician Assistant | Admitting: Physician Assistant

## 2017-06-16 DIAGNOSIS — Z1231 Encounter for screening mammogram for malignant neoplasm of breast: Secondary | ICD-10-CM | POA: Diagnosis not present

## 2017-06-16 DIAGNOSIS — R928 Other abnormal and inconclusive findings on diagnostic imaging of breast: Secondary | ICD-10-CM | POA: Diagnosis not present

## 2017-06-16 DIAGNOSIS — N644 Mastodynia: Secondary | ICD-10-CM | POA: Diagnosis not present

## 2017-07-02 ENCOUNTER — Other Ambulatory Visit: Payer: Self-pay | Admitting: Cardiovascular Disease

## 2017-10-11 ENCOUNTER — Other Ambulatory Visit: Payer: Self-pay | Admitting: Physician Assistant

## 2017-10-11 DIAGNOSIS — R232 Flushing: Secondary | ICD-10-CM

## 2017-10-19 ENCOUNTER — Other Ambulatory Visit: Payer: Self-pay | Admitting: Physician Assistant

## 2017-10-19 DIAGNOSIS — R232 Flushing: Secondary | ICD-10-CM

## 2017-10-19 MED ORDER — ESTRADIOL 1 MG PO TABS: 1.0000 mg | ORAL_TABLET | Freq: Every day | ORAL | 1 refills | Status: DC

## 2017-10-19 NOTE — Telephone Encounter (Signed)
Pt is requesting a refill and all future refills of estradiol (ESTRACE) 1 MG tablet be filled at: [place], [place] [phone] (Phone) [phone] (Fax   This change is due to cost.  Thanks, [facility]

## 2017-12-04 ENCOUNTER — Other Ambulatory Visit: Payer: Self-pay | Admitting: Cardiovascular Disease

## 2017-12-26 ENCOUNTER — Ambulatory Visit: Payer: BLUE CROSS/BLUE SHIELD | Admitting: Cardiovascular Disease

## 2017-12-27 ENCOUNTER — Other Ambulatory Visit: Payer: Self-pay | Admitting: Physician Assistant

## 2017-12-27 DIAGNOSIS — M8949 Other hypertrophic osteoarthropathy, multiple sites: Secondary | ICD-10-CM

## 2017-12-27 DIAGNOSIS — M159 Polyosteoarthritis, unspecified: Secondary | ICD-10-CM

## 2017-12-27 DIAGNOSIS — M150 Primary generalized (osteo)arthritis: Secondary | ICD-10-CM

## 2017-12-27 DIAGNOSIS — I10 Essential (primary) hypertension: Secondary | ICD-10-CM

## 2018-01-22 NOTE — Progress Notes (Deleted)
Cardiology Office Note  Date:  01/22/2018   ID:  Leslie Koch, DOB Jun 23, 1955, MRN [id number]  PCP:  Mar Daring, [name]   No chief complaint on file.   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 Would recommend leg elevation at home, compression hose at work  She reports that over the past several months she has had worsening leg swelling and shortness of breath on exertion Initially leg swelling on the right, felt it was secondary to knee replacement surgery 3 then developing leg swelling on the left, not to the extent of the right lower extremity  Weight has trended upwards, feels she is bloated with fluid She does have high fluid intake daily, spends much of her time sitting, does billing for [employer]  She has also noticed worsening shortness of breath, degree with exertion Has trouble walking long distances, has to stop and recover  Feels that she is also had some PND, orthopnea  Daytime somnolence Lots of snoring, tossing turning at nighttime but unclear if she has apnea  Weight is up at least 10 pounds since March 2018  EKG personally reviewed by myself on todays visit Normal sinus rhythm with rate 83 bpm no significant ST or T-wave changes    PMH:   has a past medical history of Arthritis, GERD (gastroesophageal reflux disease), Hypertension, and PONV (postoperative nausea and vomiting).  PSH:    Past Surgical History:  Procedure Laterality Date  . ABDOMINAL HYSTERECTOMY  01/1999  . CYST EXCISION  1974  . KNEE SURGERY Right 07/2010  . 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 TOTAL KNEE ARTHROPLASTY WITH REVISION COMPONENTS Right 02/11/2016   Procedure:  RIGHT TOTAL KNEE ARTHROPLASTY REVISION;  Surgeon: Gaynelle Arabian, MD;  Location: [facility];  Service: Orthopedics;  Laterality: Right;  . TUBAL LIGATION  1984    Current Outpatient Medications  Medication Sig Dispense Refill  . amoxicillin-clavulanate (AUGMENTIN) 875-125 MG tablet Take 1 tablet by mouth 2 (two) times daily. 20 tablet 0  . aspirin EC 81 MG tablet Take by mouth.    . Calcium Carbonate-Vitamin D (CALCIUM 500/D PO) Take by mouth.    . celecoxib (CELEBREX) 200 MG capsule TAKE 1 CAPSULE BY MOUTH  DAILY 90 capsule 3  . cetirizine (ZYRTEC ALLERGY) 10 MG tablet Take 10 mg by mouth daily.     . Cholecalciferol (VITAMIN D3) 1000 units CAPS Take by mouth.    . estradiol (ESTRACE) 1 MG tablet Take 1 tablet (1 mg total) by mouth daily. 90 tablet 1  . furosemide (LASIX) 40 MG tablet TAKE 1 TABLET BY MOUTH TWICE A DAY AS NEEDED 60 tablet 0  . Glucosamine-Chondroitin 750-600 MG TABS Take by mouth.    Marland Kitchen HYDROcodone-homatropine (HYCODAN) 5-1.5 MG/5ML syrup 5 ml 4-6 hours as needed for cough 120 mL 0  . lisinopril-hydrochlorothiazide (PRINZIDE,ZESTORETIC) 20-12.5 MG tablet TAKE 1 TABLET DAILY 90 tablet 3  . Multiple Vitamin (MULTIVITAMIN) tablet Take by mouth.    . Omega-3 Fatty Acids (FISH OIL) 1000 MG CAPS Take by mouth.    . OMEGA-3 FATTY ACIDS PO 2000 mgs by mouth twice a day    . Omeprazole 20 MG TBEC Take 20 mg by mouth 2 (two) times  daily.     . oxymetazoline (AFRIN) 0.05 % nasal spray Place 1-2 sprays into both nostrils 2 (two) times daily as needed for congestion.    . potassium chloride (K-DUR) 10 MEQ tablet Take 1 tablet (10 mEq total) by mouth 2 (two) times daily as needed. 60 tablet 4  . predniSONE (DELTASONE) 20 MG tablet One pill twice daily for 5 days 10 tablet 0  . ranitidine (ZANTAC) 150 MG tablet Take 1 tablet (150 mg total) by mouth 2 (two) times daily as needed for heartburn. 60 tablet 0  . senna-docusate (SENOKOT-S) 8.6-50 MG tablet Take 1 tablet by mouth at bedtime.    .  simvastatin (ZOCOR) 40 MG tablet TAKE 1 TABLET BY MOUTH AT  BEDTIME 90 tablet 3  . traMADol (ULTRAM) 50 MG tablet Take 1-2 tablets (50-100 mg total) by mouth every 6 (six) hours as needed (mild to moderate pain). 56 tablet 0  . valACYclovir (VALTREX) 1000 MG tablet Take 1-2 tablets (1,000-2,000 mg total) by mouth every 12 (twelve) hours as needed. 28 tablet 3   No current facility-administered medications for this visit.      Allergies:   Augmentin [amoxicillin-pot clavulanate] and Montelukast sodium   Social History:  The patient  reports that she has quit smoking. Her smoking use included cigarettes. She has a 60.00 pack-year smoking history. She has never used smokeless tobacco. She reports current alcohol use. She reports that she does not use drugs.   Family History:   family history includes Breast cancer in her paternal grandmother; Coronary artery disease in her maternal grandfather and mother; Diabetes in her mother; Hypertension in her mother; Lung cancer in her father; Stroke in her maternal grandmother.    Review of Systems: Review of Systems  Constitutional: Negative.        Weight gain  Respiratory: Positive for shortness of breath.   Cardiovascular: Positive for leg swelling.  Gastrointestinal: Negative.   Musculoskeletal: Negative.   Neurological: Negative.   Psychiatric/Behavioral: Negative.   All other systems reviewed and are negative.    PHYSICAL EXAM: VS:  There were no vitals taken for this visit. , BMI There is no height or weight on file to calculate BMI. GEN: Well nourished, well developed, in no acute distress, obese  HEENT: normal  Neck: no JVD, carotid bruits, or masses Cardiac: RRR; no murmurs, rubs, or gallops, trace to 1+ nonpitting lower extremity edema  Respiratory:  clear to auscultation bilaterally, normal work of breathing GI: soft, nontender, nondistended, + BS MS: no deformity or atrophy  Skin: warm and dry, no rash Neuro:  Strength and  sensation are intact Psych: euthymic mood, full affect    Recent Labs: No results found for requested labs within last 8760 hours.    Lipid Panel No results found for: CHOL, HDL, LDLCALC, TRIG    Wt Readings from Last 3 Encounters:  02/21/17 217 lb (98.4 kg)  11/05/16 220 lb (99.8 kg)  10/27/16 220 lb (99.8 kg)       ASSESSMENT AND PLAN:  SOB (shortness of breath) Etiology discussed with her, Prior smoking history, obesity, deconditioning Unable to exclude diastolic CHF Echocardiogram has been ordered Additional testing such as stress testing could be ordered if symptoms do not improve  Leg swelling Likely combination of venous insufficiency, edema from obesity, with possible component of edema from diastolic CHF Normal renal function on BMP Recommended she increase Lasix up to 40 mg daily, could try several days of 40 twice a day  She will take potassium pill with every Lasix Echocardiogram to evaluate right heart pressures She is scheduled to see vein endovascular in one month time  Benign essential HTN Blood pressure is well controlled on today's visit. No changes made to the medications.  Hypercholesteremia Currently on simvastatin 40 mg daily  Smoker Prior history of smoking at least 30 years Unable to exclude mild COPD as a contributor to her shortness of breath She has not tried inhalers in the past  Disposition:   F/U  3 months  Patient seen in consultation for Fenton Malling and will be referred back to her office for ongoing care of the issues teeth above   Total encounter time more than 60 minutes  Greater than 50% was spent in counseling and coordination of care with the patient   No orders of the defined types were placed in this encounter.    Signed, Esmond Plants, M.D., Ph.D. 01/22/2018  [place], [place]

## 2018-01-23 ENCOUNTER — Ambulatory Visit: Payer: BLUE CROSS/BLUE SHIELD | Admitting: Cardiovascular Disease

## 2018-01-31 ENCOUNTER — Telehealth: Payer: Self-pay | Admitting: Physician Assistant

## 2018-01-31 DIAGNOSIS — R600 Localized edema: Secondary | ICD-10-CM

## 2018-01-31 NOTE — Telephone Encounter (Signed)
Patient is calling to see if you will refill furosemide (LASIX) 40 MG tablet.  She states that Dr. Rockey Situ put her on it and it has been helping with her swelling and when she called Dr. Donivan Scull office to get a refill they told her that she would need to follow up before getting a refill and she doesn't think she needs to follow up because her heart is good.  She uses [facility].

## 2018-01-31 NOTE — Telephone Encounter (Signed)
Please advise refill? 

## 2018-02-01 MED ORDER — FUROSEMIDE 40 MG PO TABS: 40.0000 mg | ORAL_TABLET | Freq: Two times a day (BID) | ORAL | 0 refills | Status: DC | PRN

## 2018-02-01 NOTE — Telephone Encounter (Signed)
I will refill x 1 month, but she needs to come have her kidney function and potassium checked while on this medication. I will place orders and she can come in at her convenience for labs.

## 2018-02-01 NOTE — Telephone Encounter (Signed)
LMTCB

## 2018-02-03 DIAGNOSIS — M25562 Pain in left knee: Secondary | ICD-10-CM | POA: Diagnosis not present

## 2018-02-03 DIAGNOSIS — Z471 Aftercare following joint replacement surgery: Secondary | ICD-10-CM | POA: Diagnosis not present

## 2018-02-03 DIAGNOSIS — Z96651 Presence of right artificial knee joint: Secondary | ICD-10-CM | POA: Diagnosis not present

## 2018-02-13 ENCOUNTER — Other Ambulatory Visit: Payer: Self-pay | Admitting: Physician Assistant

## 2018-02-13 DIAGNOSIS — B001 Herpesviral vesicular dermatitis: Secondary | ICD-10-CM

## 2018-02-13 MED ORDER — VALACYCLOVIR HCL 1 G PO TABS: 1000.0000 mg | ORAL_TABLET | Freq: Two times a day (BID) | ORAL | 3 refills | Status: DC | PRN

## 2018-02-13 NOTE — Telephone Encounter (Signed)
Pt needs refill on Valtrex pills.  She has had a flair up.  [facility]  Call back is [phone]  Thank Con Memos

## 2018-03-01 ENCOUNTER — Other Ambulatory Visit: Payer: Self-pay | Admitting: Physician Assistant

## 2018-03-01 DIAGNOSIS — R600 Localized edema: Secondary | ICD-10-CM

## 2018-03-09 NOTE — Progress Notes (Incomplete)
NO SHOW

## 2018-03-13 ENCOUNTER — Ambulatory Visit: Payer: BLUE CROSS/BLUE SHIELD | Admitting: Cardiovascular Disease

## 2018-03-14 ENCOUNTER — Encounter: Payer: Self-pay | Admitting: Cardiovascular Disease

## 2018-04-22 ENCOUNTER — Other Ambulatory Visit: Payer: Self-pay | Admitting: Physician Assistant

## 2018-04-22 DIAGNOSIS — R232 Flushing: Secondary | ICD-10-CM

## 2018-04-24 ENCOUNTER — Other Ambulatory Visit: Payer: Self-pay | Admitting: Physician Assistant

## 2018-04-24 DIAGNOSIS — E7800 Pure hypercholesterolemia, unspecified: Secondary | ICD-10-CM

## 2018-04-24 MED ORDER — SIMVASTATIN 40 MG PO TABS: 40.0000 mg | ORAL_TABLET | Freq: Every day | ORAL | 3 refills | Status: DC

## 2018-04-24 NOTE — Telephone Encounter (Signed)
[facility] faxed refill request for the following medications:  simvastatin (ZOCOR) 40 MG tablet   90 day supply  Please advise. Thanks TNP

## 2018-05-04 ENCOUNTER — Telehealth: Payer: Self-pay

## 2018-05-04 NOTE — Telephone Encounter (Signed)
Called patient from recall.  No answer. LMOV.  Patient is past due for a 12 month follow up.  Needs an Evisit or place recall for August

## 2018-05-17 NOTE — Telephone Encounter (Signed)
Called patient from recall.  No answer. LMOV.  This is the 2nd attempt per recall list.

## 2018-05-25 NOTE — Telephone Encounter (Signed)
Called patient from recall list.  Patient stated she did not want to be on the call list.  She stated her last appointment with Dr. Rockey Situ he Stated she has a vascular issue and she has been following up with [facility]. Patient has no symptoms and will call our office if she needs [place].  Will delete recall.

## 2018-08-04 DIAGNOSIS — M25562 Pain in left knee: Secondary | ICD-10-CM | POA: Diagnosis not present

## 2018-08-04 DIAGNOSIS — M1712 Unilateral primary osteoarthritis, left knee: Secondary | ICD-10-CM | POA: Diagnosis not present

## 2018-08-13 ENCOUNTER — Other Ambulatory Visit: Payer: Self-pay | Admitting: Physician Assistant

## 2018-08-13 DIAGNOSIS — R232 Flushing: Secondary | ICD-10-CM

## 2018-11-10 ENCOUNTER — Other Ambulatory Visit: Payer: Self-pay | Admitting: Physician Assistant

## 2018-11-10 DIAGNOSIS — I10 Essential (primary) hypertension: Secondary | ICD-10-CM

## 2018-12-28 ENCOUNTER — Telehealth: Payer: Self-pay | Admitting: Physician Assistant

## 2018-12-28 DIAGNOSIS — R600 Localized edema: Secondary | ICD-10-CM

## 2018-12-28 MED ORDER — FUROSEMIDE 40 MG PO TABS: 40.0000 mg | ORAL_TABLET | Freq: Two times a day (BID) | ORAL | 0 refills | Status: DC | PRN

## 2018-12-28 NOTE — Telephone Encounter (Signed)
Patient advised as directed below. She said she is aware and that she will back to schedule appt next year. Offered to schedule appt. For her but she said she will back.

## 2018-12-28 NOTE — Telephone Encounter (Signed)
[facility] faxed refill request for the following medications:  furosemide (LASIX) 40 MG tablet   Please advise.  Thanks, [employer]

## 2018-12-28 NOTE — Telephone Encounter (Signed)
Lasix refilled x 90 days but patient needs appt. Has not had CPE since 2016 and not seen in office since 02/21/17. Needs labs. Needs CPE.

## 2019-01-04 ENCOUNTER — Other Ambulatory Visit: Payer: Self-pay | Admitting: Physician Assistant

## 2019-01-04 DIAGNOSIS — M8949 Other hypertrophic osteoarthropathy, multiple sites: Secondary | ICD-10-CM

## 2019-01-04 DIAGNOSIS — M159 Polyosteoarthritis, unspecified: Secondary | ICD-10-CM

## 2019-01-31 ENCOUNTER — Ambulatory Visit: Payer: Self-pay

## 2019-01-31 NOTE — Telephone Encounter (Signed)
Patient called stating that she has Moderma vaccine Monday 01/22/19. First dose.  She has swelling and soreness in her arm the first day.  Friday the 15 started fever, cough, headache, body aches, and today has noticed that she has trouble taking a deep breath. She states that she has been COVID-19 negative yesterday.  She had had past problems with the flu vaccine and dose not take it any more. Her fever has been at highest 100.6.  Care advice read to patient.  She verbalized understanding. Call transferred to office for scheduling.  Reason for Disposition . Fever > 100.0 F (37.8 C) present > 3 days (72 hours)  Answer Assessment - Initial Assessment Questions 1. MAIN CONCERN OR SYMPTOM:  "What is your main concern right now?" "What question do you have?" "What's the main symptom you're worried about?" (e.g., fever, pain, redness, swelling)     Fever low grade after mederna  Monday 11th 2. VACCINE: "What vaccination did you receive?" "Is this your first or second shot?" (e.g., none; Moderna, Pfizer, other)    Moderna 3. SYMPTOM ONSET: "When did the symptoms begin?" (e.g., not relevant; hours, days)      Monday with pain and swelling in arm 4. SYMPTOM SEVERITY: "How bad is it?"      Getting everyday cough last night  Hard to take deep breath 5. FEVER: "Is there a fever?" If so, ask: "What is it, how was it measured, and when did it start?"      Fever started Friday 15th 100.6 6. PAST REACTIONS: "Have you reacted to immunizations before?" If so, ask: "What happened?"     Had with flu vaccine and stopped taking it 7. OTHER SYMPTOMS: "Do you have any other symptoms?"    Cough headache, muscle aches, flu symptoms  Protocols used: CORONAVIRUS (COVID-19) VACCINE QUESTIONS AND REACTIONS-A-AH

## 2019-01-31 NOTE — Telephone Encounter (Signed)
Patient wanted to wait and do a virtual visit with you.

## 2019-02-01 ENCOUNTER — Encounter: Payer: Self-pay | Admitting: Physician Assistant

## 2019-02-01 ENCOUNTER — Other Ambulatory Visit: Payer: Self-pay

## 2019-02-01 ENCOUNTER — Ambulatory Visit (INDEPENDENT_AMBULATORY_CARE_PROVIDER_SITE_OTHER): Payer: BC Managed Care – PPO | Admitting: Physician Assistant

## 2019-02-01 DIAGNOSIS — B349 Viral infection, unspecified: Secondary | ICD-10-CM

## 2019-02-01 MED ORDER — ALBUTEROL SULFATE HFA 108 (90 BASE) MCG/ACT IN AERS: 2.0000 | INHALATION_SPRAY | Freq: Four times a day (QID) | RESPIRATORY_TRACT | 0 refills | Status: DC | PRN

## 2019-02-01 MED ORDER — HYDROCODONE-HOMATROPINE 5-1.5 MG/5ML PO SYRP
ORAL_SOLUTION | ORAL | 0 refills | Status: DC
Start: 2019-02-01 — End: 2019-02-01

## 2019-02-01 MED ORDER — HYDROCODONE-HOMATROPINE 5-1.5 MG/5ML PO SYRP
ORAL_SOLUTION | ORAL | 0 refills | Status: DC
Start: 2019-02-01 — End: 2019-02-06

## 2019-02-01 NOTE — Patient Instructions (Signed)

## 2019-02-01 NOTE — Progress Notes (Signed)
Patient: Leslie Koch Female    DOB: 23-Sep-1955   64 y.o.   MRN: [id number] Visit Date: 02/01/2019  Today's Provider: Mar Daring, [name]   Chief Complaint  Patient presents with  . URI   Subjective:    Virtual Visit via Telephone Note  I connected with Leslie Koch on 02/01/19 at 10:40 AM EST by telephone and verified that I am speaking with the correct person using two identifiers.  Location: Patient: Home Provider: Home office in [place], [place]   I discussed the limitations, risks, security and privacy concerns of performing an evaluation and management service by telephone and the availability of in person appointments. I also discussed with the patient that there may be a patient responsible charge related to this service. The patient expressed understanding and agreed to proceed.   URI  This is a new problem. Episode onset: 6 days ago. The problem has been gradually worsening. Maximum temperature: up to 101.6. Associated symptoms include congestion (chest congestion), coughing (dry), headaches and a sore throat (scratchy). Pertinent negatives include no abdominal pain, chest pain, diarrhea, dysuria, ear pain, nausea or vomiting. She has tried acetaminophen (also Mucinex) for the symptoms. The treatment provided no relief.  Patient received the Moderna COVID vaccine on 01/22/2019. Mild symptoms started one day after receiving the vaccine, and progressively worsened four days later. Patient states she has had 2 negative COVID test since onset of symptoms.   Allergies  Allergen Reactions  . Augmentin [Amoxicillin-Pot Clavulanate]     diarrhea  . Montelukast Sodium Rash     Current Outpatient Medications:  .  aspirin EC 81 MG tablet, Take by mouth., Disp: , Rfl:  .  Calcium Carbonate-Vitamin D (CALCIUM 500/D PO), Take by mouth., Disp: , Rfl:  .  celecoxib (CELEBREX) 200 MG capsule, TAKE 1 CAPSULE BY MOUTH  DAILY, Disp: 90 capsule, Rfl: 3 .  cetirizine  (ZYRTEC ALLERGY) 10 MG tablet, Take 10 mg by mouth daily. , Disp: , Rfl:  .  Cholecalciferol (VITAMIN D3) 1000 units CAPS, Take by mouth., Disp: , Rfl:  .  estradiol (ESTRACE) 1 MG tablet, TAKE 1 TABLET BY MOUTH  DAILY, Disp: 90 tablet, Rfl: 3 .  furosemide (LASIX) 40 MG tablet, Take 1 tablet (40 mg total) by mouth 2 (two) times daily as needed., Disp: 180 tablet, Rfl: 0 .  Glucosamine-Chondroitin 750-600 MG TABS, Take by mouth., Disp: , Rfl:  .  lisinopril-hydrochlorothiazide (ZESTORETIC) 20-12.5 MG tablet, TAKE 1 TABLET BY MOUTH  DAILY, Disp: 90 tablet, Rfl: 0 .  Multiple Vitamin (MULTIVITAMIN) tablet, Take by mouth., Disp: , Rfl:  .  Omega-3 Fatty Acids (FISH OIL) 1000 MG CAPS, Take by mouth., Disp: , Rfl:  .  OMEGA-3 FATTY ACIDS PO, 2000 mgs by mouth twice a day, Disp: , Rfl:  .  Omeprazole 20 MG TBEC, Take 20 mg by mouth 2 (two) times daily. , Disp: , Rfl:  .  oxymetazoline (AFRIN) 0.05 % nasal spray, Place 1-2 sprays into both nostrils 2 (two) times daily as needed for congestion., Disp: , Rfl:  .  potassium chloride (K-DUR) 10 MEQ tablet, Take 1 tablet (10 mEq total) by mouth 2 (two) times daily as needed., Disp: 60 tablet, Rfl: 4 .  senna-docusate (SENOKOT-S) 8.6-50 MG tablet, Take 1 tablet by mouth at bedtime., Disp: , Rfl:  .  simvastatin (ZOCOR) 40 MG tablet, Take 1 tablet (40 mg total) by mouth at bedtime., Disp: 90 tablet, Rfl: 3 .  valACYclovir (VALTREX) 1000 MG tablet, Take 1-2 tablets (1,000-2,000 mg total) by mouth every 12 (twelve) hours as needed., Disp: 28 tablet, Rfl: 3 .  ranitidine (ZANTAC) 150 MG tablet, Take 1 tablet (150 mg total) by mouth 2 (two) times daily as needed for heartburn. (Patient not taking: Reported on 02/01/2019), Disp: 60 tablet, Rfl: 0 .  traMADol (ULTRAM) 50 MG tablet, Take 1-2 tablets (50-100 mg total) by mouth every 6 (six) hours as needed (mild to moderate pain). (Patient not taking: Reported on 02/01/2019), Disp: 56 tablet, Rfl: 0  Review of Systems    Constitutional: Positive for fatigue and fever. Negative for appetite change and chills.  HENT: Positive for congestion (chest congestion) and sore throat (scratchy). Negative for ear pain.   Respiratory: Positive for cough (dry) and shortness of breath. Negative for chest tightness.   Cardiovascular: Negative for chest pain and palpitations.  Gastrointestinal: Negative for abdominal pain, diarrhea, nausea and vomiting.  Genitourinary: Negative for dysuria.  Musculoskeletal: Positive for myalgias.  Neurological: Positive for weakness and headaches. Negative for dizziness.    Social History   Tobacco Use  . Smoking status: Former Smoker    Packs/day: 2.00    Years: 30.00    Pack years: 60.00    Types: Cigarettes  . Smokeless tobacco: Never Used  . Tobacco comment: quit 04/14/1995  Substance Use Topics  . Alcohol use: Yes    Alcohol/week: 0.0 standard drinks    Comment: occasionally 2-3 drinks once or twice a month      Objective:   Temp 98.9 F (37.2 C) (Oral)  Vitals:   02/01/19 0839  Temp: 98.9 F (37.2 C)  TempSrc: Oral  There is no height or weight on file to calculate BMI.   Physical Exam Vitals reviewed.  Constitutional:      General: She is not in acute distress. Pulmonary:     Effort: No respiratory distress (able to speak in full sentences without issue).  Neurological:     Mental Status: She is alert.      No results found for any visits on 02/01/19.     Assessment & Plan     1. Acute viral syndrome Suspect Flu. Will send albuterol for chest tightness. Hycodan cough syrup for cough. Continue Mucinex. May use tylenol and IBU alternating every 3 hours as needed for body aches and fevers. Work note provided. Call if worsening or not improved in 10-14 days.  - albuterol (VENTOLIN HFA) 108 (90 Base) MCG/ACT inhaler; Inhale 2 puffs into the lungs every 6 (six) hours as needed for wheezing or shortness of breath.  Dispense: 18 g; Refill: 0 -  HYDROcodone-homatropine (HYCODAN) 5-1.5 MG/5ML syrup; 5 ml 4-6 hours as needed for cough  Dispense: 120 mL; Refill: 0   I discussed the assessment and treatment plan with the patient. The patient was provided an opportunity to ask questions and all were answered. The patient agreed with the plan and demonstrated an understanding of the instructions.   The patient was advised to call back or seek an in-person evaluation if the symptoms worsen or if the condition fails to improve as anticipated.  I provided 11 minutes of non-face-to-face time during this encounter.     Mar Daring, [name]  [facility]

## 2019-02-04 ENCOUNTER — Inpatient Hospital Stay
Admission: EM | Admit: 2019-02-04 | Discharge: 2019-02-06 | DRG: 177 | Disposition: A | Discharge status: Other Acute Inpatient Hospital | Payer: BC Managed Care – PPO | Attending: Internal Medicine | Admitting: Internal Medicine

## 2019-02-04 ENCOUNTER — Other Ambulatory Visit: Payer: Self-pay

## 2019-02-04 ENCOUNTER — Emergency Department: Payer: BC Managed Care – PPO

## 2019-02-04 ENCOUNTER — Encounter: Payer: Self-pay | Admitting: Emergency Medicine

## 2019-02-04 DIAGNOSIS — Z6837 Body mass index (BMI) 37.0-37.9, adult: Secondary | ICD-10-CM | POA: Diagnosis not present

## 2019-02-04 DIAGNOSIS — Z8249 Family history of ischemic heart disease and other diseases of the circulatory system: Secondary | ICD-10-CM

## 2019-02-04 DIAGNOSIS — R112 Nausea with vomiting, unspecified: Secondary | ICD-10-CM | POA: Diagnosis present

## 2019-02-04 DIAGNOSIS — K219 Gastro-esophageal reflux disease without esophagitis: Secondary | ICD-10-CM | POA: Diagnosis present

## 2019-02-04 DIAGNOSIS — J9601 Acute respiratory failure with hypoxia: Secondary | ICD-10-CM | POA: Diagnosis not present

## 2019-02-04 DIAGNOSIS — R630 Anorexia: Secondary | ICD-10-CM | POA: Diagnosis present

## 2019-02-04 DIAGNOSIS — Z79890 Hormone replacement therapy (postmenopausal): Secondary | ICD-10-CM | POA: Diagnosis not present

## 2019-02-04 DIAGNOSIS — M1990 Unspecified osteoarthritis, unspecified site: Secondary | ICD-10-CM | POA: Diagnosis not present

## 2019-02-04 DIAGNOSIS — E669 Obesity, unspecified: Secondary | ICD-10-CM | POA: Diagnosis not present

## 2019-02-04 DIAGNOSIS — Z7982 Long term (current) use of aspirin: Secondary | ICD-10-CM | POA: Diagnosis not present

## 2019-02-04 DIAGNOSIS — E785 Hyperlipidemia, unspecified: Secondary | ICD-10-CM | POA: Diagnosis not present

## 2019-02-04 DIAGNOSIS — I872 Venous insufficiency (chronic) (peripheral): Secondary | ICD-10-CM | POA: Diagnosis not present

## 2019-02-04 DIAGNOSIS — J069 Acute upper respiratory infection, unspecified: Secondary | ICD-10-CM | POA: Diagnosis not present

## 2019-02-04 DIAGNOSIS — U071 COVID-19: Secondary | ICD-10-CM | POA: Diagnosis not present

## 2019-02-04 DIAGNOSIS — Z79899 Other long term (current) drug therapy: Secondary | ICD-10-CM | POA: Diagnosis not present

## 2019-02-04 DIAGNOSIS — R0902 Hypoxemia: Secondary | ICD-10-CM

## 2019-02-04 DIAGNOSIS — E7800 Pure hypercholesterolemia, unspecified: Secondary | ICD-10-CM | POA: Diagnosis present

## 2019-02-04 DIAGNOSIS — Z880 Allergy status to penicillin: Secondary | ICD-10-CM | POA: Diagnosis not present

## 2019-02-04 DIAGNOSIS — I10 Essential (primary) hypertension: Secondary | ICD-10-CM | POA: Diagnosis present

## 2019-02-04 DIAGNOSIS — Z888 Allergy status to other drugs, medicaments and biological substances status: Secondary | ICD-10-CM | POA: Diagnosis not present

## 2019-02-04 DIAGNOSIS — Z87891 Personal history of nicotine dependence: Secondary | ICD-10-CM | POA: Diagnosis not present

## 2019-02-04 DIAGNOSIS — J1282 Pneumonia due to coronavirus disease 2019: Secondary | ICD-10-CM | POA: Diagnosis not present

## 2019-02-04 DIAGNOSIS — R0602 Shortness of breath: Secondary | ICD-10-CM | POA: Diagnosis not present

## 2019-02-04 LAB — COMPREHENSIVE METABOLIC PANEL
ALT: 18 U/L (ref 0–44)
AST: 30 U/L (ref 15–41)
Albumin: 2.8 g/dL — ABNORMAL LOW (ref 3.5–5.0)
Alkaline Phosphatase: 77 U/L (ref 38–126)
Anion gap: 11 (ref 5–15)
BUN: 17 mg/dL (ref 8–23)
CO2: 28 mmol/L (ref 22–32)
Calcium: 8.6 mg/dL — ABNORMAL LOW (ref 8.9–10.3)
Chloride: 95 mmol/L — ABNORMAL LOW (ref 98–111)
Creatinine, Ser: 0.86 mg/dL (ref 0.44–1.00)
GFR calc Af Amer: >60 mL/min (ref >60)
GFR calc non Af Amer: >60 mL/min (ref >60)
Glucose, Bld: 103 mg/dL — ABNORMAL HIGH (ref 70–99)
Potassium: 4.5 mmol/L (ref 3.5–5.1)
Sodium: 134 mmol/L — ABNORMAL LOW (ref 135–145)
Total Bilirubin: 0.9 mg/dL (ref 0.3–1.2)
Total Protein: 7.2 g/dL (ref 6.5–8.1)

## 2019-02-04 LAB — CBC WITH DIFFERENTIAL/PLATELET
Abs Immature Granulocytes: 0.09 10*3/uL — ABNORMAL HIGH (ref 0.00–0.07)
Basophils Absolute: 0 10*3/uL (ref 0.0–0.1)
Basophils Relative: 0 %
Eosinophils Absolute: 0 10*3/uL (ref 0.0–0.5)
Eosinophils Relative: 0 %
HCT: 41.5 % (ref 36.0–46.0)
Hemoglobin: 13.8 g/dL (ref 12.0–15.0)
Immature Granulocytes: 1 %
Lymphocytes Relative: 12 %
Lymphs Abs: 1 10*3/uL (ref 0.7–4.0)
MCH: 30.8 pg (ref 26.0–34.0)
MCHC: 33.3 g/dL (ref 30.0–36.0)
MCV: 92.6 fL (ref 80.0–100.0)
Monocytes Absolute: 0.5 10*3/uL (ref 0.1–1.0)
Monocytes Relative: 6 %
Neutro Abs: 6.4 10*3/uL (ref 1.7–7.7)
Neutrophils Relative %: 81 %
Platelets: 307 10*3/uL (ref 150–400)
RBC: 4.48 MIL/uL (ref 3.87–5.11)
RDW: 12.7 % (ref 11.5–15.5)
Smear Review: NORMAL
WBC: 8.1 10*3/uL (ref 4.0–10.5)
nRBC: 0 % (ref 0.0–0.2)

## 2019-02-04 LAB — URINALYSIS, COMPLETE (UACMP) WITH MICROSCOPIC
Bilirubin Urine: NEGATIVE
Glucose, UA: NEGATIVE mg/dL
Hgb urine dipstick: NEGATIVE
Ketones, ur: NEGATIVE mg/dL
Nitrite: NEGATIVE
Protein, ur: NEGATIVE mg/dL
Specific Gravity, Urine: 1.011 (ref 1.005–1.030)
pH: 6 (ref 5.0–8.0)

## 2019-02-04 LAB — LACTIC ACID, PLASMA
Lactic Acid, Venous: 1.5 mmol/L (ref 0.5–1.9)
Lactic Acid, Venous: 2 mmol/L (ref 0.5–1.9)

## 2019-02-04 LAB — RESPIRATORY PANEL BY RT PCR (FLU A&B, COVID)
Influenza A by PCR: NEGATIVE
Influenza B by PCR: NEGATIVE
SARS Coronavirus 2 by RT PCR: POSITIVE — AB

## 2019-02-04 LAB — TROPONIN I (HIGH SENSITIVITY)
Troponin I (High Sensitivity): 3 ng/L (ref <18)
Troponin I (High Sensitivity): 3 ng/L (ref <18)

## 2019-02-04 LAB — FIBRIN DERIVATIVES D-DIMER (ARMC ONLY): Fibrin derivatives D-dimer (ARMC): 945.06 ng/mL (FEU) — ABNORMAL HIGH (ref 0.00–499.00)

## 2019-02-04 LAB — PROCALCITONIN: Procalcitonin: <0.1 ng/mL

## 2019-02-04 LAB — ABO/RH: ABO/RH(D): A POS

## 2019-02-04 LAB — HIV ANTIBODY (ROUTINE TESTING W REFLEX): HIV Screen 4th Generation wRfx: NONREACTIVE

## 2019-02-04 MED ORDER — GUAIFENESIN-DM 100-10 MG/5ML PO SYRP
10.0000 mL | ORAL_SOLUTION | ORAL | Status: DC | PRN
  Administered 2019-02-05: 10 mL via ORAL
  Filled 2019-02-04 (×3): qty 10

## 2019-02-04 MED ORDER — SODIUM CHLORIDE 0.9 % IV BOLUS
1000.0000 mL | Freq: Once | INTRAVENOUS | Status: AC
  Administered 2019-02-04: 1000 mL via INTRAVENOUS

## 2019-02-04 MED ORDER — ASCORBIC ACID 500 MG PO TABS
500.0000 mg | ORAL_TABLET | Freq: Every day | ORAL | Status: DC
  Administered 2019-02-04 – 2019-02-06 (×3): 500 mg via ORAL
  Filled 2019-02-04 (×3): qty 1

## 2019-02-04 MED ORDER — SODIUM CHLORIDE 0.9% FLUSH
3.0000 mL | Freq: Two times a day (BID) | INTRAVENOUS | Status: DC
  Administered 2019-02-04 – 2019-02-06 (×2): 3 mL via INTRAVENOUS

## 2019-02-04 MED ORDER — LACTATED RINGERS IV SOLN: INTRAVENOUS | Status: DC

## 2019-02-04 MED ORDER — SODIUM CHLORIDE 0.9 % IV SOLN
200.0000 mg | Freq: Once | INTRAVENOUS | Status: AC
  Administered 2019-02-04: 18:00:00 200 mg via INTRAVENOUS
  Filled 2019-02-04: qty 200

## 2019-02-04 MED ORDER — SODIUM CHLORIDE 0.9 % IV SOLN: INTRAVENOUS | Status: DC | PRN

## 2019-02-04 MED ORDER — ONDANSETRON HCL 4 MG PO TABS: 4.0000 mg | ORAL_TABLET | Freq: Four times a day (QID) | ORAL | Status: DC | PRN

## 2019-02-04 MED ORDER — IPRATROPIUM-ALBUTEROL 20-100 MCG/ACT IN AERS
1.0000 | INHALATION_SPRAY | Freq: Four times a day (QID) | RESPIRATORY_TRACT | Status: DC
  Administered 2019-02-04 – 2019-02-06 (×8): 1 via RESPIRATORY_TRACT
  Filled 2019-02-04: qty 4

## 2019-02-04 MED ORDER — PANTOPRAZOLE SODIUM 40 MG PO TBEC
40.0000 mg | DELAYED_RELEASE_TABLET | Freq: Two times a day (BID) | ORAL | Status: DC
  Administered 2019-02-04 – 2019-02-06 (×4): 40 mg via ORAL
  Filled 2019-02-04 (×4): qty 1

## 2019-02-04 MED ORDER — DEXAMETHASONE 4 MG PO TABS
6.0000 mg | ORAL_TABLET | ORAL | Status: DC
  Administered 2019-02-05 – 2019-02-06 (×2): 6 mg via ORAL
  Filled 2019-02-04 (×2): qty 2

## 2019-02-04 MED ORDER — ZINC SULFATE 220 (50 ZN) MG PO CAPS
220.0000 mg | ORAL_CAPSULE | Freq: Every day | ORAL | Status: DC
  Administered 2019-02-04 – 2019-02-06 (×3): 220 mg via ORAL
  Filled 2019-02-04 (×3): qty 1

## 2019-02-04 MED ORDER — SODIUM CHLORIDE 0.9 % IV SOLN
1.0000 g | Freq: Once | INTRAVENOUS | Status: AC
  Administered 2019-02-04: 1 g via INTRAVENOUS
  Filled 2019-02-04: qty 10

## 2019-02-04 MED ORDER — ASPIRIN EC 81 MG PO TBEC
81.0000 mg | DELAYED_RELEASE_TABLET | Freq: Every day | ORAL | Status: DC
  Administered 2019-02-04 – 2019-02-05 (×2): 81 mg via ORAL
  Filled 2019-02-04 (×2): qty 1

## 2019-02-04 MED ORDER — ALBUTEROL SULFATE HFA 108 (90 BASE) MCG/ACT IN AERS
2.0000 | INHALATION_SPRAY | Freq: Four times a day (QID) | RESPIRATORY_TRACT | Status: DC | PRN
  Filled 2019-02-04: qty 6.7

## 2019-02-04 MED ORDER — SIMVASTATIN 20 MG PO TABS
40.0000 mg | ORAL_TABLET | Freq: Every day | ORAL | Status: DC
  Administered 2019-02-04 – 2019-02-05 (×2): 40 mg via ORAL
  Filled 2019-02-04 (×2): qty 2

## 2019-02-04 MED ORDER — DEXAMETHASONE SODIUM PHOSPHATE 10 MG/ML IJ SOLN
6.0000 mg | Freq: Once | INTRAMUSCULAR | Status: AC
  Administered 2019-02-04: 6 mg via INTRAVENOUS
  Filled 2019-02-04: qty 1

## 2019-02-04 MED ORDER — BUTALBITAL-APAP-CAFFEINE 50-325-40 MG PO TABS
1.0000 | ORAL_TABLET | ORAL | Status: DC | PRN
  Administered 2019-02-04 – 2019-02-05 (×2): 1 via ORAL
  Filled 2019-02-04 (×2): qty 1

## 2019-02-04 MED ORDER — BUTALBITAL-APAP-CAFFEINE 50-325-40 MG PO TABS
2.0000 | ORAL_TABLET | Freq: Once | ORAL | Status: AC
Start: 2019-02-04 — End: 2019-02-04
  Administered 2019-02-04: 2 via ORAL
  Filled 2019-02-04: qty 2

## 2019-02-04 MED ORDER — ENOXAPARIN SODIUM 40 MG/0.4ML ~~LOC~~ SOLN
40.0000 mg | SUBCUTANEOUS | Status: DC
  Administered 2019-02-04: 40 mg via SUBCUTANEOUS
  Filled 2019-02-04: qty 0.4

## 2019-02-04 MED ORDER — ONDANSETRON HCL 4 MG/2ML IJ SOLN: 4.0000 mg | Freq: Four times a day (QID) | INTRAMUSCULAR | Status: DC | PRN

## 2019-02-04 MED ORDER — HYDROCOD POLST-CPM POLST ER 10-8 MG/5ML PO SUER
5.0000 mL | Freq: Two times a day (BID) | ORAL | Status: DC | PRN
  Administered 2019-02-04 – 2019-02-06 (×3): 5 mL via ORAL
  Filled 2019-02-04 (×3): qty 5

## 2019-02-04 MED ORDER — SODIUM CHLORIDE 0.9 % IV SOLN
500.0000 mg | Freq: Once | INTRAVENOUS | Status: AC
  Administered 2019-02-04: 21:00:00 500 mg via INTRAVENOUS
  Filled 2019-02-04 (×2): qty 500

## 2019-02-04 MED ORDER — LORATADINE 10 MG PO TABS
10.0000 mg | ORAL_TABLET | Freq: Every day | ORAL | Status: DC
  Administered 2019-02-04 – 2019-02-06 (×3): 10 mg via ORAL
  Filled 2019-02-04 (×3): qty 1

## 2019-02-04 MED ORDER — POLYETHYLENE GLYCOL 3350 17 G PO PACK: 17.0000 g | PACK | Freq: Every day | ORAL | Status: DC | PRN

## 2019-02-04 MED ORDER — ACETAMINOPHEN 325 MG PO TABS
650.0000 mg | ORAL_TABLET | Freq: Four times a day (QID) | ORAL | Status: DC | PRN
  Administered 2019-02-04: 18:00:00 650 mg via ORAL
  Filled 2019-02-04: qty 2

## 2019-02-04 MED ORDER — SODIUM CHLORIDE 0.9 % IV SOLN
100.0000 mg | Freq: Every day | INTRAVENOUS | Status: DC
  Administered 2019-02-05 – 2019-02-06 (×2): 100 mg via INTRAVENOUS
  Filled 2019-02-04: qty 100
  Filled 2019-02-04: qty 20

## 2019-02-04 NOTE — ED Notes (Signed)
Lab called to draw second set of cultures

## 2019-02-04 NOTE — H&P (Signed)
History and Physical    Leslie Koch [id number] DOB: [birth year] DOA: 02/04/2019  PCP: Mar Daring, [name]   Patient coming from: Home  I have personally briefly reviewed patient's old medical records in [place]  Chief Complaint: Cough and shortness of breath.  HPI: Leslie Koch is a 64 y.o. female with medical history significant of hypertension and GERD came to ED with worsening shortness of breath and cough.She works at [employer] and received her 1st COVID-19 vaccination on 01/22/2019. She developed a scratchy throat on 01/24/2019. Her rapid COVID test was negative on the 12th as well as on 19 at her workplace. Since last Friday she developed fever with chills, generalized malaise, frequent headaches and shortness of breath.  She do has some nausea and vomiting.  Her appetite is decreased.  No change in her taste or smell.  She was complaining of worsening headaches with some blurry vision.  No focal weakness.  No diarrhea or urinary symptoms. Few of her coworkers were recently diagnosed with COVID-19 along with couple of friends.  She was not using any PPE around them.  ED Course: She was afebrile, softer blood pressure and desaturating in high 80s on arrival, requiring 2 L.  Labs were positive for COVID-19, added inflammatory markers and lactic acid of 2.2.  Chest x-ray with bilateral infiltrate. Admitted for COVID-19 pneumonia.  Review of Systems: As per HPI otherwise 10 point review of systems negative.   Past Medical History:  Diagnosis Date  . Arthritis   . GERD (gastroesophageal reflux disease)   . Hypertension   . PONV (postoperative nausea and vomiting)     Past Surgical History:  Procedure Laterality Date  . ABDOMINAL HYSTERECTOMY  01/1999  . CYST EXCISION  1974  . KNEE SURGERY Right 07/2010  . 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 TOTAL KNEE ARTHROPLASTY WITH REVISION COMPONENTS Right 02/11/2016   Procedure: RIGHT TOTAL KNEE ARTHROPLASTY  REVISION;  Surgeon: Gaynelle Arabian, MD;  Location: [facility];  Service: Orthopedics;  Laterality: Right;  . TUBAL LIGATION  1984     reports that she has quit smoking. Her smoking use included cigarettes. She has a 60.00 pack-year smoking history. She has never used smokeless tobacco. She reports current alcohol use. She reports that she does not use drugs.  Allergies  Allergen Reactions  . Augmentin [Amoxicillin-Pot Clavulanate]     diarrhea  . Montelukast Sodium Rash    Family History  Problem Relation Age of Onset  . Hypertension Mother   . Diabetes Mother   . Coronary artery disease Mother   . Lung cancer Father   . Stroke Maternal Grandmother   . Coronary artery disease Maternal Grandfather   . Breast cancer Paternal Grandmother     Prior to Admission medications   Medication Sig Start Date End Date Taking? Authorizing Provider  albuterol (VENTOLIN HFA) 108 (90 Base) MCG/ACT inhaler Inhale 2 puffs into the lungs every 6 (six) hours as needed for wheezing or shortness of breath. 02/01/19   Burnette, [name], [name]  aspirin EC 81 MG tablet Take by mouth.    [provider]  Calcium Carbonate-Vitamin D (CALCIUM 500/D PO) Take by mouth.    [provider]  celecoxib (CELEBREX) 200 MG capsule TAKE 1 CAPSULE BY MOUTH  DAILY 01/08/19   Mar Daring, [name]  cetirizine (ZYRTEC ALLERGY) 10 MG tablet Take 10 mg by mouth daily.     [provider]  Cholecalciferol (VITAMIN D3) 1000  units CAPS Take by mouth.    [provider]  estradiol (ESTRACE) 1 MG tablet TAKE 1 TABLET BY MOUTH  DAILY 08/13/18   Mar Daring, [name]  furosemide (LASIX) 40 MG tablet Take 1 tablet (40 mg total) by mouth 2 (two) times daily as needed. 12/28/18   Mar Daring, [name]  Glucosamine-Chondroitin 750-600 MG TABS Take by mouth.    [provider]  HYDROcodone-homatropine Judithe Modest) 5-1.5 MG/5ML syrup 5 ml 4-6 hours as needed for cough 02/01/19   Mar Daring, [name]  lisinopril-hydrochlorothiazide (ZESTORETIC) 20-12.5 MG tablet TAKE 1 TABLET BY MOUTH  DAILY 11/10/18   Mar Daring, [name]  Multiple Vitamin (MULTIVITAMIN) tablet Take by mouth.    [provider]  Omega-3 Fatty Acids (FISH OIL) 1000 MG CAPS Take by mouth.    [provider]  OMEGA-3 FATTY ACIDS PO 2000 mgs by mouth twice a day    [provider]  Omeprazole 20 MG TBEC Take 20 mg by mouth 2 (two) times daily.     [provider]  oxymetazoline (AFRIN) 0.05 % nasal spray Place 1-2 sprays into both nostrils 2 (two) times daily as needed for congestion.    [provider]  potassium chloride (K-DUR) 10 MEQ tablet Take 1 tablet (10 mEq total) by mouth 2 (two) times daily as needed. 08/25/16   Minna Merritts, MD  ranitidine (ZANTAC) 150 MG tablet Take 1 tablet (150 mg total) by mouth 2 (two) times daily as needed for heartburn. Patient not taking: Reported on 02/01/2019 02/13/16   Dara Lords, Alexzandrew L, [name]  senna-docusate (SENOKOT-S) 8.6-50 MG tablet Take 1 tablet by mouth at bedtime.    [provider]  simvastatin (ZOCOR) 40 MG tablet Take 1 tablet (40 mg total) by mouth at bedtime. 04/24/18   Mar Daring, [name]  traMADol (ULTRAM) 50 MG tablet Take 1-2 tablets (50-100 mg total) by mouth every 6 (six) hours as needed (mild to moderate pain). Patient not taking: Reported on 02/01/2019 02/13/16   Dara Lords, Alexzandrew L, [name]  valACYclovir (VALTREX) 1000 MG tablet Take 1-2 tablets (1,000-2,000 mg total) by mouth every 12 (twelve) hours as needed. 02/13/18   Mar Daring, [name]  estradiol (ESTRACE) 1 MG tablet TAKE 1 TABLET BY MOUTH  DAILY 04/24/18   Mar Daring, [name]    Physical Exam: Vitals:   02/04/19 1349 02/04/19 1400 02/04/19 1430 02/04/19 1500  BP:  112/74 107/72 126/74  Pulse:  84 79 83  Resp:  16 15 18   Temp:      TempSrc:      SpO2:  97% 96% 98%  Weight: 90.7 kg     Height: 5\' 1"  (1.549 m)        General: Vital signs reviewed.  Patient is well-developed and well-nourished, in no acute distress and cooperative with exam.  Head: Normocephalic and atraumatic. Eyes: EOMI, conjunctivae normal, no scleral icterus.  ENMT: Mucous membranes are moist.  Neck: Supple, trachea midline, normal ROM, no JVD, masses, thyromegaly, or carotid bruit present.  Cardiovascular: RRR, S1 normal, S2 normal, no murmurs, gallops, or rubs. Pulmonary/Chest: Clear to auscultation bilaterally, no wheezes, rales, or rhonchi. Abdominal: Soft, non-tender, non-distended, BS +, no masses, organomegaly, or guarding present.  Extremities: No lower extremity edema bilaterally,  pulses symmetric and intact bilaterally. No cyanosis or clubbing. Neurological: A&O x3, Strength is normal and symmetric bilaterally, cranial nerve II-XII are grossly intact, no focal motor deficit, sensory intact to light touch bilaterally.  Skin: Warm, dry and intact. No rashes or erythema. Psychiatric: Normal mood and affect. speech and behavior is normal. Cognition and memory are normal.   Labs on Admission: I have personally reviewed following labs and imaging studies  CBC: Recent Labs  Lab 02/04/19 1333  WBC 8.1  NEUTROABS 6.4  HGB 13.8  HCT 41.5  MCV 92.6  PLT [id number]   Basic Metabolic Panel: Recent Labs  Lab 02/04/19 1333  NA 134*  K 4.5  CL 95*  CO2 28  GLUCOSE 103*  BUN 17  CREATININE 0.86  CALCIUM 8.6*   GFR: Estimated Creatinine Clearance: 69.6 mL/min (by C-G formula based on SCr of 0.86 mg/dL). Liver Function Tests: Recent Labs  Lab 02/04/19 1333  AST 30  ALT 18  ALKPHOS 77  BILITOT 0.9  PROT 7.2  ALBUMIN 2.8*   No results for input(s): LIPASE, AMYLASE in the last 168 hours. No results for input(s): AMMONIA in the last 168 hours. Coagulation Profile: No results for input(s): INR, PROTIME in the last 168 hours. Cardiac Enzymes: No results for input(s): CKTOTAL, CKMB, CKMBINDEX, TROPONINI in the last  168 hours. BNP (last 3 results) No results for input(s): PROBNP in the last 8760 hours. HbA1C: No results for input(s): HGBA1C in the last 72 hours. CBG: No results for input(s): GLUCAP in the last 168 hours. Lipid Profile: No results for input(s): CHOL, HDL, LDLCALC, TRIG, CHOLHDL, LDLDIRECT in the last 72 hours. Thyroid Function Tests: No results for input(s): TSH, T4TOTAL, FREET4, T3FREE, THYROIDAB in the last 72 hours. Anemia Panel: No results for input(s): VITAMINB12, FOLATE, FERRITIN, TIBC, IRON, RETICCTPCT in the last 72 hours. Urine analysis:    Component Value Date/Time   COLORURINE Yellow 11/20/2013 1241   APPEARANCEUR Clear 11/20/2013 1241   LABSPEC 1.015 11/20/2013 1241   PHURINE 6.0 11/20/2013 1241   GLUCOSEU Negative 11/20/2013 1241   HGBUR Negative 11/20/2013 1241   BILIRUBINUR Negative 11/20/2013 1241   KETONESUR Negative 11/20/2013 1241   PROTEINUR Negative 11/20/2013 1241   NITRITE Negative 11/20/2013 1241   LEUKOCYTESUR Trace 11/20/2013 1241    Radiological Exams on Admission: DG Chest Portable 1 View  Result Date: 02/04/2019 CLINICAL DATA:  Shortness of breath. EXAM: PORTABLE CHEST 1 VIEW COMPARISON:  Chest x-ray dated 01/15/2008. FINDINGS: Heart size and mediastinal contours are within normal limits. Patchy peripheral airspace opacities, LEFT slightly greater than RIGHT. No pleural effusion or pneumothorax is seen. Osseous structures about the chest are unremarkable. IMPRESSION: Patchy peripheral airspace opacities, LEFT slightly greater than RIGHT, compatible with multifocal pneumonia versus pulmonary edema. Electronically Signed   By: Franki Cabot M.D.   On: 02/04/2019 13:16    EKG: Independently reviewed.  Normal sinus rhythm without any significant abnormality.  Assessment/Plan Active Problems:   Pneumonia due to COVID-19 virus   COVID-19 pneumonia.  Currently hemodynamically stable. -Give her some IV fluids for elevated lactic acid. -Start her on  remdesivir and Decadron. -Start her on Combivent. -Oxygen supplement to keep saturation above 90%. -Vitamin C and zinc supplement. -Tylenol for headache-can prescribe tramadol if needed and Tylenol does not work. -Monitor inflammatory markers.  Hypertension.  Currently softer blood pressure. -Holding home antihypertensives-she was on a combination of lisinopril and HCTZ along with Lasix at home.  If needed we will restart lisinopril and hold HCTZ and Lasix as she appears dry at this time.  DVT prophylaxis: Lovenox Code Status: Full code Family Communication: No family at bedside Disposition Plan: Improvement.  Likely will go back home. Consults called:  None Admission status: Inpatient   Lorella Nimrod MD [facility] Pager [phone]  If 7PM-7AM, please contact night-coverage [email or web address] Password [facility]  02/04/2019, 3:29 PM   This record has been created using Dragon voice recognition software. Errors have been sought and corrected,but may not always be located. Such creation errors do not reflect on the standard of care.

## 2019-02-04 NOTE — ED Notes (Signed)
Lab notified to add on HIV to 2 SST tubes sent with labs previously drawn.

## 2019-02-04 NOTE — ED Triage Notes (Signed)
Pt to ED with c/o of SOB. Pt received COVID vaccine on Monday and developed a sore throat on Wed. Pt states symptoms now include SOB, nausea and generalized fatigue. Pt Sat level 88%. Pt placed on 2L.

## 2019-02-04 NOTE — ED Notes (Signed)
Lab in room to collect second set of cultures

## 2019-02-04 NOTE — ED Provider Notes (Signed)
[facility] Emergency Department Provider Note ____________________________________________   First MD Initiated Contact with Patient 02/04/19 1235     (approximate)  I have reviewed the triage vital signs and the nursing notes.   HISTORY  Chief Complaint Shortness of Breath  HPI Leslie Koch is a 64 y.o. female who presents to the emergency department for treatment and evaluation of shortness of breath, chest pain, and headache. No cardiac history or lung disease. She works at [employer] and received her 1st COVID-19 vaccination on 01/22/2019. She developed a scratchy throat on 01/24/2019. Her rapid COVID test was negative on the 14th as well as each additional test thereafter. Her symptoms have progressed from scratchy throat to chest pain, shortness of breath, and headache. No relief with alternating tylenol and ibuprofen. Her PCP prescribed cough meds and albuterol. No relief.      Past Medical History:  Diagnosis Date  . Arthritis   . GERD (gastroesophageal reflux disease)   . Hypertension   . PONV (postoperative nausea and vomiting)     Patient Active Problem List   Diagnosis Date Noted  . Lymphedema 10/27/2016  . Chronic venous insufficiency 10/27/2016  . Leg swelling 08/24/2016  . SOB (shortness of breath) 08/24/2016  . Smoker 08/24/2016  . 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 Failed total knee arthroplasty ([place]) 02/11/2016  . Hot flashes, menopausal 08/14/2014  . Cystocele 08/09/2014  . Rectocele 08/09/2014  . Anxiety 08/06/2014  . Acid reflux 08/06/2014  . Hypercholesteremia 08/06/2014  . Benign essential HTN 08/06/2014  . Cannot sleep 08/06/2014  . Post hysterectomy menopause 08/06/2014  . Avitaminosis D 08/06/2014    Past Surgical History:  Procedure Laterality Date  . ABDOMINAL HYSTERECTOMY  01/1999  . CYST EXCISION  1974  . KNEE SURGERY Right 07/2010  . 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 TOTAL  KNEE ARTHROPLASTY WITH REVISION COMPONENTS Right 02/11/2016   Procedure: RIGHT TOTAL KNEE ARTHROPLASTY REVISION;  Surgeon: Gaynelle Arabian, MD;  Location: [facility] ORS;  Service: Orthopedics;  Laterality: Right;  . TUBAL LIGATION  1984    Prior to Admission medications   Medication Sig Start Date End Date Taking? Authorizing Provider  albuterol (VENTOLIN HFA) 108 (90 Base) MCG/ACT inhaler Inhale 2 puffs into the lungs every 6 (six) hours as needed for wheezing or shortness of breath. 02/01/19   Burnette, [name], [name]  aspirin EC 81 MG tablet Take by mouth.    [provider]  Calcium Carbonate-Vitamin D (CALCIUM 500/D PO) Take by mouth.    [provider]  celecoxib (CELEBREX) 200 MG capsule TAKE 1 CAPSULE BY MOUTH  DAILY 01/08/19   Mar Daring, [name]  cetirizine (ZYRTEC ALLERGY) 10 MG tablet Take 10 mg by mouth daily.     [provider]  Cholecalciferol (VITAMIN D3) 1000 units CAPS Take by mouth.    [provider]  estradiol (ESTRACE) 1 MG tablet TAKE 1 TABLET BY MOUTH  DAILY 08/13/18   Mar Daring, [name]  furosemide (LASIX) 40 MG tablet Take 1 tablet (40 mg total) by mouth 2 (two) times daily as needed. 12/28/18   Mar Daring, [name]  Glucosamine-Chondroitin 750-600 MG TABS Take by mouth.    [provider]  HYDROcodone-homatropine Judithe Modest) 5-1.5 MG/5ML syrup 5 ml 4-6 hours as needed for cough 02/01/19   Mar Daring, [name]  lisinopril-hydrochlorothiazide (ZESTORETIC) 20-12.5 MG tablet TAKE 1 TABLET BY MOUTH  DAILY 11/10/18   Mar Daring, [name]  Multiple Vitamin (MULTIVITAMIN) tablet Take by mouth.    [provider]  Omega-3 Fatty Acids (FISH OIL) 1000 MG CAPS Take by mouth.    [provider]  OMEGA-3 FATTY ACIDS PO 2000 mgs by mouth twice a day    [provider]  Omeprazole 20 MG TBEC Take 20 mg by mouth 2 (two) times daily.     [provider]  oxymetazoline (AFRIN) 0.05 %  nasal spray Place 1-2 sprays into both nostrils 2 (two) times daily as needed for congestion.    [provider]  potassium chloride (K-DUR) 10 MEQ tablet Take 1 tablet (10 mEq total) by mouth 2 (two) times daily as needed. 08/25/16   Minna Merritts, MD  ranitidine (ZANTAC) 150 MG tablet Take 1 tablet (150 mg total) by mouth 2 (two) times daily as needed for heartburn. Patient not taking: Reported on 02/01/2019 02/13/16   Dara Lords, Alexzandrew L, [name]  senna-docusate (SENOKOT-S) 8.6-50 MG tablet Take 1 tablet by mouth at bedtime.    [provider]  simvastatin (ZOCOR) 40 MG tablet Take 1 tablet (40 mg total) by mouth at bedtime. 04/24/18   Mar Daring, [name]  traMADol (ULTRAM) 50 MG tablet Take 1-2 tablets (50-100 mg total) by mouth every 6 (six) hours as needed (mild to moderate pain). Patient not taking: Reported on 02/01/2019 02/13/16   Dara Lords, Alexzandrew L, [name]  valACYclovir (VALTREX) 1000 MG tablet Take 1-2 tablets (1,000-2,000 mg total) by mouth every 12 (twelve) hours as needed. 02/13/18   Mar Daring, [name]  estradiol (ESTRACE) 1 MG tablet TAKE 1 TABLET BY MOUTH  DAILY 04/24/18   Mar Daring, [name]    Allergies Augmentin [amoxicillin-pot clavulanate] and Montelukast sodium  Family History  Problem Relation Age of Onset  . Hypertension Mother   . Diabetes Mother   . Coronary artery disease Mother   . Lung cancer Father   . Stroke Maternal Grandmother   . Coronary artery disease Maternal Grandfather   . Breast cancer Paternal Grandmother     Social History Social History   Tobacco Use  . Smoking status: Former Smoker    Packs/day: 2.00    Years: 30.00    Pack years: 60.00    Types: Cigarettes  . Smokeless tobacco: Never Used  . Tobacco comment: quit 04/14/1995  Substance Use Topics  . Alcohol use: Yes    Alcohol/week: 0.0 standard drinks    Comment: occasionally 2-3 drinks once or twice a month  . Drug use: No    Review of  Systems  Constitutional: Positive for fever/chills Eyes: No visual changes. ENT: Positive for sore throat. Cardiovascular: Positive for chest pain. Respiratory: Positive for shortness of breath. Gastrointestinal: No abdominal pain.  No nausea, no vomiting.  No diarrhea.  No constipation. Genitourinary: Negative for dysuria. Musculoskeletal: Negative for back pain. Skin: Negative for rash. Neurological: Positive for for headaches, negative for focal weakness or numbness. ____________________________________________   PHYSICAL EXAM:  VITAL SIGNS: ED Triage Vitals  Enc Vitals Group     BP 02/04/19 1219 100/69     Pulse Rate 02/04/19 1219 86     Resp 02/04/19 1219 18     Temp 02/04/19 1219 98.2 F (36.8 C)     Temp Source 02/04/19 1219 Oral     SpO2 02/04/19 1219 (!) 87 %     Weight --      Height 02/04/19 1227 5\' 1"  (1.549 m)     Head Circumference --  Peak Flow --      Pain Score 02/04/19 1226 6     Pain Loc --      Pain Edu? --      Excl. in [place]? --     Constitutional: Alert and oriented. Acutely ill appearing and in no acute distress. Eyes: Conjunctivae are normal. PERRL. EOMI. Head: Atraumatic. Nose: No congestion/rhinnorhea. Mouth/Throat: Mucous membranes are moist.  Oropharynx non-erythematous. Neck: No stridor.   Hematological/Lymphatic/Immunilogical: No cervical lymphadenopathy. Cardiovascular: Normal rate, regular rhythm. Grossly normal heart sounds.  Good peripheral circulation. Respiratory: Normal respiratory effort.  No retractions. Lungs CTAB. Gastrointestinal: Soft and nontender. No distention. No abdominal bruits. No CVA tenderness. Genitourinary:  Musculoskeletal: No lower extremity tenderness nor edema.  No joint effusions. Neurologic:  Normal speech and language. No gross focal neurologic deficits are appreciated. No gait instability. Skin:  Skin is warm, dry and intact. No rash noted. Psychiatric: Mood and affect are normal. Speech and behavior  are normal.  ____________________________________________   LABS (all labs ordered are listed, but only abnormal results are displayed)  Labs Reviewed  RESPIRATORY PANEL BY RT PCR (FLU A&B, COVID) - Abnormal; Notable for the following components:      Result Value   SARS Coronavirus 2 by RT PCR POSITIVE (*)    All other components within normal limits  CBC WITH DIFFERENTIAL/PLATELET - Abnormal; Notable for the following components:   Abs Immature Granulocytes 0.09 (*)    All other components within normal limits  FIBRIN DERIVATIVES D-DIMER ([facility] ONLY) - Abnormal; Notable for the following components:   Fibrin derivatives D-dimer ([facility]) 945.06 (*)    All other components within normal limits  COMPREHENSIVE METABOLIC PANEL - Abnormal; Notable for the following components:   Sodium 134 (*)    Chloride 95 (*)    Glucose, Bld 103 (*)    Calcium 8.6 (*)    Albumin 2.8 (*)    All other components within normal limits  CULTURE, BLOOD (ROUTINE X 2)  CULTURE, BLOOD (ROUTINE X 2)  LACTIC ACID, PLASMA  URINALYSIS, COMPLETE (UACMP) WITH MICROSCOPIC  LACTIC ACID, PLASMA  PROCALCITONIN  TROPONIN I (HIGH SENSITIVITY)  TROPONIN I (HIGH SENSITIVITY)   ____________________________________________  EKG  ED ECG REPORT I, Abigaelle Verley, FNP-BC personally viewed and interpreted this ECG.   Date: 02/04/2019  EKG Time: 1319  Rate: 82  Rhythm: normal sinus rhythm  Axis: leftward  Intervals:none  ST&T Change: no ST elevation  ____________________________________________  RADIOLOGY  ED MD interpretation:    Patchy bilateral opacities, left greater than right.  Official radiology report(s): DG Chest Portable 1 View  Result Date: 02/04/2019 CLINICAL DATA:  Shortness of breath. EXAM: PORTABLE CHEST 1 VIEW COMPARISON:  Chest x-ray dated 01/15/2008. FINDINGS: Heart size and mediastinal contours are within normal limits. Patchy peripheral airspace opacities, LEFT slightly greater than  RIGHT. No pleural effusion or pneumothorax is seen. Osseous structures about the chest are unremarkable. IMPRESSION: Patchy peripheral airspace opacities, LEFT slightly greater than RIGHT, compatible with multifocal pneumonia versus pulmonary edema. Electronically Signed   By: Franki Cabot M.D.   On: 02/04/2019 13:16    ____________________________________________   PROCEDURES  Procedure(s) performed (including Critical Care):  Procedures  ____________________________________________   INITIAL IMPRESSION / ASSESSMENT AND PLAN   64 year old female presenting to the emergency department for treatment and evaluation of medical complaints as listed in HPI. Initial plan will be to get labs, ekg, and chest x-ray.   DIFFERENTIAL DIAGNOSIS  COVID-19, Influenza, CAP, septicemia  ED COURSE  Lactic acid is normal, white blood cell count is 8.1 without a shift, CMP is overall reassuring.  D-dimer is elevated at 945.06.  Plan will be to admit once COVID-19 screening has resulted which I expect to be postive.   ----------------------------------------- 3:03 PM on 02/04/2019 -----------------------------------------  COVID-19 positive. She is 94% on 2 liters O2. Decadron to be given. Plan will be to admit.  ----------------------------------------- 3:23 PM on 02/04/2019 -----------------------------------------  Hospitalist accepts patient for admission.  Leslie Koch was evaluated in Emergency Department on 02/04/2019 for the symptoms described in the history of present illness. She was evaluated in the context of the global COVID-19 pandemic, which necessitated consideration that the patient might be at risk for infection with the SARS-CoV-2 virus that causes COVID-19. Institutional protocols and algorithms that pertain to the evaluation of patients at risk for COVID-19 are in a state of rapid change based on information released by regulatory bodies including the CDC and federal and  state organizations. These policies and algorithms were followed during the patient's care in the ED.  CRITICAL CARE Performed by: Sherrie George   Total critical care time: 30 minutes  Critical care time was exclusive of separately billable procedures and treating other patients.  Critical care was necessary to treat or prevent imminent or life-threatening deterioration.  Critical care was time spent personally by me on the following activities: development of treatment plan with patient and/or surrogate as well as nursing, discussions with consultants, evaluation of patient's response to treatment, examination of patient, obtaining history from patient or surrogate, ordering and performing treatments and interventions, ordering and review of laboratory studies, ordering and review of radiographic studies, pulse oximetry and re-evaluation of patient's condition.   ____________________________________________   FINAL CLINICAL IMPRESSION(S) / ED DIAGNOSES  Final diagnoses:  Pneumonia due to COVID-19 virus  Hypoxia     ED Discharge Orders    None       Note:  This document was prepared using Dragon voice recognition software and may include unintentional dictation errors.   Victorino Dike, FNP 02/04/19 1525    Vanessa Verden, MD 02/04/19 1525

## 2019-02-04 NOTE — Progress Notes (Signed)
Remdesivir - Pharmacy Brief Note   1/24 SARS Coronavirus 2 by RT PCR: Positive   O:  ALT: 18 CXR: Patchy peripheral airspace opacities, LEFT slightly greater than RIGHT, compatible with multifocal pneumonia  SpO2: 88% on room air   A/P:  Remdesivir 200 mg IVPB once followed by 100 mg IVPB daily x 4 days.   Pernell Dupre, PharmD, BCPS Clinical Pharmacist 02/04/2019 3:30 PM

## 2019-02-05 DIAGNOSIS — J9601 Acute respiratory failure with hypoxia: Secondary | ICD-10-CM

## 2019-02-05 LAB — CBC WITH DIFFERENTIAL/PLATELET
Abs Immature Granulocytes: 0.02 10*3/uL (ref 0.00–0.07)
Basophils Absolute: 0 10*3/uL (ref 0.0–0.1)
Basophils Relative: 0 %
Eosinophils Absolute: 0 10*3/uL (ref 0.0–0.5)
Eosinophils Relative: 0 %
HCT: 34 % — ABNORMAL LOW (ref 36.0–46.0)
Hemoglobin: 11.7 g/dL — ABNORMAL LOW (ref 12.0–15.0)
Immature Granulocytes: 1 %
Lymphocytes Relative: 22 %
Lymphs Abs: 0.8 10*3/uL (ref 0.7–4.0)
MCH: 31.5 pg (ref 26.0–34.0)
MCHC: 34.4 g/dL (ref 30.0–36.0)
MCV: 91.6 fL (ref 80.0–100.0)
Monocytes Absolute: 0.2 10*3/uL (ref 0.1–1.0)
Monocytes Relative: 6 %
Neutro Abs: 2.4 10*3/uL (ref 1.7–7.7)
Neutrophils Relative %: 71 %
Platelets: 293 10*3/uL (ref 150–400)
RBC: 3.71 MIL/uL — ABNORMAL LOW (ref 3.87–5.11)
RDW: 12.4 % (ref 11.5–15.5)
WBC: 3.4 10*3/uL — ABNORMAL LOW (ref 4.0–10.5)
nRBC: 0 % (ref 0.0–0.2)

## 2019-02-05 LAB — COMPREHENSIVE METABOLIC PANEL
ALT: 18 U/L (ref 0–44)
AST: 30 U/L (ref 15–41)
Albumin: 2.5 g/dL — ABNORMAL LOW (ref 3.5–5.0)
Alkaline Phosphatase: 64 U/L (ref 38–126)
Anion gap: 8 (ref 5–15)
BUN: 12 mg/dL (ref 8–23)
CO2: 25 mmol/L (ref 22–32)
Calcium: 8 mg/dL — ABNORMAL LOW (ref 8.9–10.3)
Chloride: 106 mmol/L (ref 98–111)
Creatinine, Ser: 0.58 mg/dL (ref 0.44–1.00)
GFR calc Af Amer: >60 mL/min (ref >60)
GFR calc non Af Amer: >60 mL/min (ref >60)
Glucose, Bld: 113 mg/dL — ABNORMAL HIGH (ref 70–99)
Potassium: 4.3 mmol/L (ref 3.5–5.1)
Sodium: 139 mmol/L (ref 135–145)
Total Bilirubin: 0.4 mg/dL (ref 0.3–1.2)
Total Protein: 6.1 g/dL — ABNORMAL LOW (ref 6.5–8.1)

## 2019-02-05 LAB — C-REACTIVE PROTEIN: CRP: 19 mg/dL — ABNORMAL HIGH (ref <1.0)

## 2019-02-05 LAB — PHOSPHORUS: Phosphorus: 2.6 mg/dL (ref 2.5–4.6)

## 2019-02-05 LAB — MAGNESIUM: Magnesium: 2.2 mg/dL (ref 1.7–2.4)

## 2019-02-05 LAB — FERRITIN: Ferritin: 279 ng/mL (ref 11–307)

## 2019-02-05 LAB — FIBRIN DERIVATIVES D-DIMER (ARMC ONLY): Fibrin derivatives D-dimer (ARMC): 894.63 ng/mL (FEU) — ABNORMAL HIGH (ref 0.00–499.00)

## 2019-02-05 MED ORDER — VITAMIN D 25 MCG (1000 UNIT) PO TABS
1000.0000 [IU] | ORAL_TABLET | Freq: Every day | ORAL | Status: DC
  Administered 2019-02-05 – 2019-02-06 (×2): 1000 [IU] via ORAL
  Filled 2019-02-05 (×2): qty 1

## 2019-02-05 MED ORDER — LISINOPRIL 20 MG PO TABS
20.0000 mg | ORAL_TABLET | Freq: Every day | ORAL | Status: DC
  Administered 2019-02-05 – 2019-02-06 (×2): 20 mg via ORAL
  Filled 2019-02-05 (×2): qty 1

## 2019-02-05 MED ORDER — SENNOSIDES-DOCUSATE SODIUM 8.6-50 MG PO TABS
1.0000 | ORAL_TABLET | Freq: Every day | ORAL | Status: DC
  Administered 2019-02-05: 1 via ORAL
  Filled 2019-02-05: qty 1

## 2019-02-05 MED ORDER — LISINOPRIL-HYDROCHLOROTHIAZIDE 20-12.5 MG PO TABS: 1.0000 | ORAL_TABLET | Freq: Every day | ORAL | Status: DC

## 2019-02-05 MED ORDER — ENOXAPARIN SODIUM 100 MG/ML ~~LOC~~ SOLN
1.0000 mg/kg | Freq: Two times a day (BID) | SUBCUTANEOUS | Status: DC
  Administered 2019-02-05 – 2019-02-06 (×2): 90 mg via SUBCUTANEOUS
  Filled 2019-02-05 (×3): qty 1

## 2019-02-05 MED ORDER — OMEGA-3-ACID ETHYL ESTERS 1 G PO CAPS
2000.0000 mg | ORAL_CAPSULE | Freq: Every day | ORAL | Status: DC
  Administered 2019-02-05 – 2019-02-06 (×2): 2000 mg via ORAL
  Filled 2019-02-05 (×2): qty 2

## 2019-02-05 MED ORDER — TOCILIZUMAB 400 MG/20ML IV SOLN
8.0000 mg/kg | Freq: Once | INTRAVENOUS | Status: AC
  Administered 2019-02-05: 726 mg via INTRAVENOUS
  Filled 2019-02-05: qty 36.3

## 2019-02-05 MED ORDER — ADULT MULTIVITAMIN W/MINERALS CH
1.0000 | ORAL_TABLET | Freq: Every day | ORAL | Status: DC
  Administered 2019-02-05 – 2019-02-06 (×2): 1 via ORAL
  Filled 2019-02-05 (×2): qty 1

## 2019-02-05 MED ORDER — HYDROCHLOROTHIAZIDE 12.5 MG PO CAPS
12.5000 mg | ORAL_CAPSULE | Freq: Every day | ORAL | Status: DC
  Administered 2019-02-05 – 2019-02-06 (×2): 12.5 mg via ORAL
  Filled 2019-02-05 (×2): qty 1

## 2019-02-05 NOTE — Progress Notes (Signed)
ANTICOAGULATION CONSULT NOTE - Initial Consult  Pharmacy Consult for enoxaparin Indication: hypercoagulable state with severe covid 19 PNA symptoms. needs therapeutic lovenox to prevent VTE.  Allergies  Allergen Reactions  . Augmentin [Amoxicillin-Pot Clavulanate]     diarrhea  . Montelukast Sodium Rash    Patient Measurements: Height: 5\' 1"  (154.9 cm) Weight: 200 lb (90.7 kg) IBW/kg (Calculated) : 47.8   Vital Signs: Temp: 98 F (36.7 C) (01/25 0700) Temp Source: Oral (01/25 0700) BP: 126/72 (01/25 0757) Pulse Rate: 83 (01/25 1225)  Labs: Recent Labs    02/04/19 1333 02/04/19 1452 02/05/19 0436  HGB 13.8  --  11.7*  HCT 41.5  --  34.0*  PLT 307  --  293  CREATININE 0.86  --  0.58  TROPONINIHS 3 3  --     Estimated Creatinine Clearance: 74.8 mL/min (by C-G formula based on SCr of 0.58 mg/dL).   Medical History: Past Medical History:  Diagnosis Date  . Arthritis   . GERD (gastroesophageal reflux disease)   . Hypertension   . PONV (postoperative nausea and vomiting)       Assessment: 64 yo female to start on therapeutic Lovenox. Pt's last dose dose of enoxaparin 40 mg was 1/24 at 2057.  Goal of Therapy:  Anti-Xa level 0.6-1 units/ml 4hrs after LMWH dose given Monitor platelets by anticoagulation protocol: Yes   Plan:  Change dose to enoxaparin 1 mg/kg q12h  Will need SCr and CBC at least every three days per policy  Pharmacy will continue to follow.   Rocky Morel 02/05/2019,12:53 PM

## 2019-02-05 NOTE — Progress Notes (Addendum)
PROGRESS NOTE                                                                                                                                                                                                             Patient Demographics:    Leslie Koch, is a 64 y.o. female, DOB - 1955-05-05, [id number]  Admit date - 02/04/2019   Admitting Physician Lorella Nimrod, MD  Outpatient Primary MD for the patient is Marlyn Corporal [name], [name]  LOS - 1    Chief Complaint  Patient presents with  . Shortness of Breath       Brief Narrative 64 year old obese female with history of hypertension and GERD presented to the ED with worsening shortness of breath with productive cough.  She works at [employer] skilled facility and reports being exposed to coworkers who got infected with COVID-19.  She received her first dose of vaccine on 1/11 after which she had a scratchy throat 2 days later.  She had a rapid Covid test done on 1/12 followed by 1/19 at her workplace which was both negative.  However since 2 days prior to admission she was having fevers with chills, malaise, increasing shortness of breath with productive cough and headaches.  Also reported nausea and vomiting with poor appetite. In the ED O2 sat dropped to the 80s requiring 2 L via nasal cannula.  Respiratory viral panel was positive for COVID-19.  She had elevated lactic acid of 2.2 with chest x-ray showing bilateral infiltrate.  Fibrin derivative D-dimer was elevated at 945.  Admitted for further management for COVID-19 infection with respiratory failure.     Subjective:   Complains of feeling congested and dyspnea on minimal exertion.   Assessment  & Plan :   Principal problem Acute respiratory failure with hypoxia (HCC)  secondary to COVID-19 infection.  Maintain airborne and contact precaution.  IV Decadron and IV remdesivir (day 2).  Supportive care with  antitussives, albuterol inhaler, vitamin C and zinc. Respiratory symptoms have worsened and requiring 6 L via nasal cannula.  Attempt proning as tolerated.  Her CRP is elevated at 19.  Fibrin derivative D-dimer is mildly improved from yesterday.  I think she will be a good candidate for Actemra and I will order her a dose.  Denies history of hepatitis or tuberculosis.  LFTs are normal.  Placed on therapeutic anticoagulation.  Given her worsened respiratory symptoms I discussed that she would benefit from being transferred to [facility] for further care but she refuses and wants to be treated here.  She would like to try Actemra and see if it improves her symptoms and if she is not improved she will reconsider being transferred.  Active problems Essential hypertension Continue losartan/HCTZ  Hyperlipidemia Continue statin      Code Status : Full code  Family Communication  : We will update husband  Disposition Plan  : Pending hospital course (may need to be transferred to [facility] for further management if patient agrees)  Barriers For Discharge : Active hypoxia  Consults  : None  Procedures  : None  DVT Prophylaxis  : Therapeutic Lovenox  Lab Results  Component Value Date   PLT 293 02/05/2019    Antibiotics    Anti-infectives (From admission, onward)   Start     Dose/Rate Route Frequency Ordered Stop   02/05/19 1000  remdesivir 100 mg in sodium chloride 0.9 % 100 mL IVPB     100 mg 200 mL/hr over 30 Minutes Intravenous Daily 02/04/19 1528 02/09/19 0959   02/04/19 1600  remdesivir 200 mg in sodium chloride 0.9% 250 mL IVPB     200 mg 580 mL/hr over 30 Minutes Intravenous Once 02/04/19 1528 02/05/19 0322   02/04/19 1430  azithromycin (ZITHROMAX) 500 mg in sodium chloride 0.9 % 250 mL IVPB     500 mg 250 mL/hr over 60 Minutes Intravenous  Once 02/04/19 1408 02/05/19 0519   02/04/19 1430  cefTRIAXone (ROCEPHIN) 1 g in sodium chloride 0.9 % 100 mL  IVPB     1 g 200 mL/hr over 30 Minutes Intravenous  Once 02/04/19 1408 02/04/19 1626        Objective:   Vitals:   02/05/19 0700 02/05/19 0757 02/05/19 1030 02/05/19 1129  BP:  126/72    Pulse:  80 78 89  Resp:      Temp: 98 F (36.7 C)     TempSrc: Oral     SpO2:  (!) 81% (!) 88% 93%  Weight:      Height:        Wt Readings from Last 3 Encounters:  02/04/19 90.7 kg  02/21/17 98.4 kg  11/05/16 99.8 kg     Intake/Output Summary (Last 24 hours) at 02/05/2019 1211 Last data filed at 02/05/2019 [id number] Gross per 24 hour  Intake 1580.15 ml  Output 1400 ml  Net 180.15 ml     Physical Exam  Gen: Fatigued and in some respiratory distress HEENT:moist mucosa, supple neck Chest: Coarse breath sounds bilaterally CVS: N S1&S2, no murmurs,  GI: soft, NT, ND,  Musculoskeletal: warm, no edema     Data Review:    CBC Recent Labs  Lab 02/04/19 1333 02/05/19 0436  WBC 8.1 3.4*  HGB 13.8 11.7*  HCT 41.5 34.0*  PLT 307 293  MCV 92.6 91.6  MCH 30.8 31.5  MCHC 33.3 34.4  RDW 12.7 12.4  LYMPHSABS 1.0 0.8  MONOABS 0.5 0.2  EOSABS 0.0 0.0  BASOSABS 0.0 0.0    Chemistries  Recent Labs  Lab 02/04/19 1333 02/05/19 0436  NA 134* 139  K 4.5 4.3  CL 95* 106  CO2 28 25  GLUCOSE 103* 113*  BUN 17 12  CREATININE 0.86 0.58  CALCIUM 8.6* 8.0*  MG  --  2.2  AST 30 30  ALT 18 18  ALKPHOS 77 64  BILITOT 0.9 0.4   ------------------------------------------------------------------------------------------------------------------ No results for input(s): CHOL, HDL, LDLCALC, TRIG, CHOLHDL, LDLDIRECT in the last 72 hours.  Lab Results  Component Value Date   HGBA1C 5.5 01/23/2016   ------------------------------------------------------------------------------------------------------------------ No results for input(s): TSH, T4TOTAL, T3FREE, THYROIDAB in the last 72 hours.  Invalid input(s):  FREET3 ------------------------------------------------------------------------------------------------------------------ Recent Labs    02/05/19 0436  FERRITIN 279    Coagulation profile No results for input(s): INR, PROTIME in the last 168 hours.  No results for input(s): DDIMER in the last 72 hours.  Cardiac Enzymes No results for input(s): CKMB, TROPONINI, MYOGLOBIN in the last 168 hours.  Invalid input(s): CK ------------------------------------------------------------------------------------------------------------------    Component Value Date/Time   BNP 17.2 07/22/2016 0813    Inpatient Medications  Scheduled Meds: . vitamin C  500 mg Oral Daily  . aspirin EC  81 mg Oral Daily  . dexamethasone  6 mg Oral Q24H  . enoxaparin (LOVENOX) injection  40 mg Subcutaneous Q24H  . Ipratropium-Albuterol  1 puff Inhalation Q6H  . loratadine  10 mg Oral Daily  . pantoprazole  40 mg Oral BID  . simvastatin  40 mg Oral QHS  . sodium chloride flush  3 mL Intravenous Q12H  . zinc sulfate  220 mg Oral Daily   Continuous Infusions: . sodium chloride 10 mL/hr at 02/05/19 0957  . lactated ringers 125 mL/hr at 02/05/19 1117  . remdesivir 100 mg in NS 100 mL 100 mg (02/05/19 1000)   PRN Meds:.sodium chloride, acetaminophen, albuterol, butalbital-acetaminophen-caffeine, chlorpheniramine-HYDROcodone, guaiFENesin-dextromethorphan, ondansetron **OR** ondansetron (ZOFRAN) IV, polyethylene glycol  Micro Results Recent Results (from the past 240 hour(s))  Blood culture (routine x 2)     Status: None (Preliminary result)   Collection Time: 02/04/19  1:30 PM   Specimen: BLOOD  Result Value Ref Range Status   Specimen Description BLOOD R AC  Final   Special Requests   Final    BOTTLES DRAWN AEROBIC AND ANAEROBIC Blood Culture results may not be optimal due to an excessive volume of blood received in culture bottles   Culture   Final    NO GROWTH < 24 HOURS Performed at [facility], [address], [place], [place] [address]    Report Status PENDING  Incomplete  Respiratory Panel by RT PCR (Flu A&B, Covid) - Nasopharyngeal Swab     Status: Abnormal   Collection Time: 02/04/19  1:36 PM   Specimen: Nasopharyngeal Swab  Result Value Ref Range Status   SARS Coronavirus 2 by RT PCR POSITIVE (A) NEGATIVE Final    Comment: RESULT CALLED TO, READ BACK BY AND VERIFIED WITH: ASHLEY ORSUTO ON 02/04/2019 AT [id number] TIK (NOTE) SARS-CoV-2 target nucleic acids are DETECTED. SARS-CoV-2 RNA is generally detectable in upper respiratory specimens  during the acute phase of infection. Positive results are indicative of the presence of the identified virus, but do not rule out bacterial infection or co-infection with other pathogens not detected by the test. Clinical correlation with patient history and other diagnostic information is necessary to determine patient infection status. The expected result is Negative. Fact Sheet for Patients:  PinkCheek.be Fact Sheet for Healthcare Providers: GravelBags.it This test is not yet approved or cleared by the [place] FDA and  has been authorized for detection and/or diagnosis of SARS-CoV-2 by FDA under an Emergency Use Authorization (EUA).  This EUA will remain in effect (meaning this test can be use d) for the duration of  the COVID-19 declaration under  Section 564(b)(1) of the Act, 21 U.S.C. section 360bbb-3(b)(1), unless the authorization is terminated or revoked sooner.    Influenza A by PCR NEGATIVE NEGATIVE Final   Influenza B by PCR NEGATIVE NEGATIVE Final    Comment: (NOTE) The Xpert Xpress SARS-CoV-2/FLU/RSV assay is intended as an aid in  the diagnosis of influenza from Nasopharyngeal swab specimens and  should not be used as a sole basis for treatment. Nasal washings and  aspirates are unacceptable for Xpert Xpress SARS-CoV-2/FLU/RSV  testing. Fact Sheet for  Patients: PinkCheek.be Fact Sheet for Healthcare Providers: GravelBags.it This test is not yet approved or cleared by the [place] FDA and  has been authorized for detection and/or diagnosis of SARS-CoV-2 by  FDA under an Emergency Use Authorization (EUA). This EUA will remain  in effect (meaning this test can be used) for the duration of the  Covid-19 declaration under Section 564(b)(1) of the Act, 21  U.S.C. section 360bbb-3(b)(1), unless the authorization is  terminated or revoked. Performed at [facility], [address] [place], [place] [address]   Blood culture (routine x 2)     Status: None (Preliminary result)   Collection Time: 02/04/19  2:51 PM   Specimen: BLOOD  Result Value Ref Range Status   Specimen Description BLOOD BLOOD RIGHT HAND  Final   Special Requests   Final    BOTTLES DRAWN AEROBIC AND ANAEROBIC Blood Culture results may not be optimal due to an inadequate volume of blood received in culture bottles   Culture   Final    NO GROWTH < 24 HOURS Performed at [facility], [address] [place], [place] [address]    Report Status PENDING  Incomplete    Radiology Reports DG Chest Portable 1 View  Result Date: 02/04/2019 CLINICAL DATA:  Shortness of breath. EXAM: PORTABLE CHEST 1 VIEW COMPARISON:  Chest x-ray dated 01/15/2008. FINDINGS: Heart size and mediastinal contours are within normal limits. Patchy peripheral airspace opacities, LEFT slightly greater than RIGHT. No pleural effusion or pneumothorax is seen. Osseous structures about the chest are unremarkable. IMPRESSION: Patchy peripheral airspace opacities, LEFT slightly greater than RIGHT, compatible with multifocal pneumonia versus pulmonary edema. Electronically Signed   By: Franki Cabot M.D.   On: 02/04/2019 13:16    Time Spent in minutes 35   Maanya Hippert M.D on 02/05/2019 at 12:11 PM  Between 7am to 7pm - Pager -  [phone]  After 7pm go to [email or web address] - password [facility]  [facility] -  Office  [phone]

## 2019-02-06 ENCOUNTER — Inpatient Hospital Stay (HOSPITAL_COMMUNITY)
Admission: AD | Admit: 2019-02-06 | Discharge: 2019-02-11 | DRG: 177 | Disposition: A | Discharge status: Home / Self Care | Payer: BC Managed Care – PPO | Source: Other Acute Inpatient Hospital | Attending: Internal Medicine | Admitting: Internal Medicine

## 2019-02-06 ENCOUNTER — Encounter (HOSPITAL_COMMUNITY): Payer: Self-pay | Admitting: Internal Medicine

## 2019-02-06 ENCOUNTER — Other Ambulatory Visit: Payer: Self-pay

## 2019-02-06 DIAGNOSIS — J9601 Acute respiratory failure with hypoxia: Secondary | ICD-10-CM | POA: Diagnosis not present

## 2019-02-06 DIAGNOSIS — J1282 Pneumonia due to coronavirus disease 2019: Secondary | ICD-10-CM | POA: Diagnosis present

## 2019-02-06 DIAGNOSIS — U071 COVID-19: Secondary | ICD-10-CM | POA: Diagnosis not present

## 2019-02-06 DIAGNOSIS — J069 Acute upper respiratory infection, unspecified: Secondary | ICD-10-CM | POA: Diagnosis not present

## 2019-02-06 DIAGNOSIS — E669 Obesity, unspecified: Secondary | ICD-10-CM | POA: Diagnosis not present

## 2019-02-06 DIAGNOSIS — E785 Hyperlipidemia, unspecified: Secondary | ICD-10-CM | POA: Diagnosis present

## 2019-02-06 DIAGNOSIS — F419 Anxiety disorder, unspecified: Secondary | ICD-10-CM | POA: Diagnosis not present

## 2019-02-06 DIAGNOSIS — I10 Essential (primary) hypertension: Secondary | ICD-10-CM | POA: Diagnosis present

## 2019-02-06 DIAGNOSIS — E7800 Pure hypercholesterolemia, unspecified: Secondary | ICD-10-CM | POA: Diagnosis present

## 2019-02-06 DIAGNOSIS — B370 Candidal stomatitis: Secondary | ICD-10-CM | POA: Diagnosis not present

## 2019-02-06 DIAGNOSIS — Z6837 Body mass index (BMI) 37.0-37.9, adult: Secondary | ICD-10-CM | POA: Diagnosis not present

## 2019-02-06 DIAGNOSIS — K219 Gastro-esophageal reflux disease without esophagitis: Secondary | ICD-10-CM | POA: Diagnosis present

## 2019-02-06 LAB — CBC WITH DIFFERENTIAL/PLATELET
Abs Immature Granulocytes: 0.04 10*3/uL (ref 0.00–0.07)
Basophils Absolute: 0 10*3/uL (ref 0.0–0.1)
Basophils Relative: 0 %
Eosinophils Absolute: 0 10*3/uL (ref 0.0–0.5)
Eosinophils Relative: 0 %
HCT: 36.7 % (ref 36.0–46.0)
Hemoglobin: 12.3 g/dL (ref 12.0–15.0)
Immature Granulocytes: 1 %
Lymphocytes Relative: 25 %
Lymphs Abs: 1.5 10*3/uL (ref 0.7–4.0)
MCH: 30.8 pg (ref 26.0–34.0)
MCHC: 33.5 g/dL (ref 30.0–36.0)
MCV: 92 fL (ref 80.0–100.0)
Monocytes Absolute: 0.5 10*3/uL (ref 0.1–1.0)
Monocytes Relative: 8 %
Neutro Abs: 3.8 10*3/uL (ref 1.7–7.7)
Neutrophils Relative %: 66 %
Platelets: 403 10*3/uL — ABNORMAL HIGH (ref 150–400)
RBC: 3.99 MIL/uL (ref 3.87–5.11)
RDW: 12.3 % (ref 11.5–15.5)
Smear Review: NORMAL
WBC: 5.8 10*3/uL (ref 4.0–10.5)
nRBC: 0 % (ref 0.0–0.2)

## 2019-02-06 LAB — BRAIN NATRIURETIC PEPTIDE: B Natriuretic Peptide: 178.8 pg/mL — ABNORMAL HIGH (ref 0.0–100.0)

## 2019-02-06 LAB — HEMOGLOBIN A1C
Hgb A1c MFr Bld: 5.7 % — ABNORMAL HIGH (ref 4.8–5.6)
Mean Plasma Glucose: 116.89 mg/dL

## 2019-02-06 LAB — COMPREHENSIVE METABOLIC PANEL
ALT: 20 U/L (ref 0–44)
AST: 35 U/L (ref 15–41)
Albumin: 2.5 g/dL — ABNORMAL LOW (ref 3.5–5.0)
Alkaline Phosphatase: 56 U/L (ref 38–126)
Anion gap: 10 (ref 5–15)
BUN: 15 mg/dL (ref 8–23)
CO2: 27 mmol/L (ref 22–32)
Calcium: 8.3 mg/dL — ABNORMAL LOW (ref 8.9–10.3)
Chloride: 102 mmol/L (ref 98–111)
Creatinine, Ser: 0.67 mg/dL (ref 0.44–1.00)
GFR calc Af Amer: >60 mL/min (ref >60)
GFR calc non Af Amer: >60 mL/min (ref >60)
Glucose, Bld: 87 mg/dL (ref 70–99)
Potassium: 3.6 mmol/L (ref 3.5–5.1)
Sodium: 139 mmol/L (ref 135–145)
Total Bilirubin: 0.6 mg/dL (ref 0.3–1.2)
Total Protein: 6.1 g/dL — ABNORMAL LOW (ref 6.5–8.1)

## 2019-02-06 LAB — GLUCOSE, CAPILLARY
Glucose-Capillary: 98 mg/dL (ref 70–99)
Glucose-Capillary: 221 mg/dL — ABNORMAL HIGH (ref 70–99)
Glucose-Capillary: 186 mg/dL — ABNORMAL HIGH (ref 70–99)
Glucose-Capillary: 118 mg/dL — ABNORMAL HIGH (ref 70–99)

## 2019-02-06 LAB — FERRITIN: Ferritin: 307 ng/mL (ref 11–307)

## 2019-02-06 LAB — MAGNESIUM: Magnesium: 1.9 mg/dL (ref 1.7–2.4)

## 2019-02-06 LAB — C-REACTIVE PROTEIN
CRP: 8.3 mg/dL — ABNORMAL HIGH (ref <1.0)
CRP: 7.9 mg/dL — ABNORMAL HIGH (ref <1.0)

## 2019-02-06 LAB — ABO/RH: ABO/RH(D): A POS

## 2019-02-06 LAB — PHOSPHORUS: Phosphorus: 2.7 mg/dL (ref 2.5–4.6)

## 2019-02-06 LAB — FIBRIN DERIVATIVES D-DIMER (ARMC ONLY): Fibrin derivatives D-dimer (ARMC): 701.58 ng/mL (FEU) — ABNORMAL HIGH (ref 0.00–499.00)

## 2019-02-06 MED ORDER — DEXAMETHASONE 6 MG PO TABS: 6.0000 mg | ORAL_TABLET | ORAL | 0 refills | Status: DC

## 2019-02-06 MED ORDER — ASCORBIC ACID 500 MG PO TABS
500.0000 mg | ORAL_TABLET | Freq: Every day | ORAL | Status: DC
  Administered 2019-02-07 – 2019-02-11 (×5): 500 mg via ORAL
  Filled 2019-02-06 (×5): qty 1

## 2019-02-06 MED ORDER — ACETAMINOPHEN 325 MG PO TABS
650.0000 mg | ORAL_TABLET | Freq: Once | ORAL | Status: AC
  Administered 2019-02-06: 17:00:00 650 mg via ORAL
  Filled 2019-02-06: qty 2

## 2019-02-06 MED ORDER — SODIUM CHLORIDE 0.9% FLUSH: 3.0000 mL | INTRAVENOUS | Status: DC | PRN

## 2019-02-06 MED ORDER — LISINOPRIL 10 MG PO TABS
20.0000 mg | ORAL_TABLET | Freq: Every day | ORAL | Status: DC
  Administered 2019-02-07 – 2019-02-10 (×4): 20 mg via ORAL
  Filled 2019-02-06 (×4): qty 2

## 2019-02-06 MED ORDER — ZINC SULFATE 220 (50 ZN) MG PO CAPS
220.0000 mg | ORAL_CAPSULE | Freq: Every day | ORAL | Status: DC
  Administered 2019-02-07 – 2019-02-11 (×5): 220 mg via ORAL
  Filled 2019-02-06 (×5): qty 1

## 2019-02-06 MED ORDER — FUROSEMIDE 10 MG/ML IJ SOLN
20.0000 mg | Freq: Once | INTRAMUSCULAR | Status: AC
  Administered 2019-02-06: 23:00:00 20 mg via INTRAVENOUS
  Filled 2019-02-06: qty 2

## 2019-02-06 MED ORDER — SODIUM CHLORIDE 0.9% FLUSH
3.0000 mL | Freq: Two times a day (BID) | INTRAVENOUS | Status: DC
  Administered 2019-02-06 – 2019-02-11 (×9): 3 mL via INTRAVENOUS

## 2019-02-06 MED ORDER — INSULIN ASPART 100 UNIT/ML ~~LOC~~ SOLN
0.0000 [IU] | Freq: Three times a day (TID) | SUBCUTANEOUS | Status: DC
  Administered 2019-02-06: 18:00:00 3 [IU] via SUBCUTANEOUS
  Administered 2019-02-07: 17:00:00 1 [IU] via SUBCUTANEOUS
  Administered 2019-02-08 – 2019-02-09 (×3): 2 [IU] via SUBCUTANEOUS

## 2019-02-06 MED ORDER — SODIUM CHLORIDE 0.9 % IV SOLN: 100.0000 mg | Freq: Every day | INTRAVENOUS | Status: DC

## 2019-02-06 MED ORDER — ACETAMINOPHEN 325 MG PO TABS: 650.0000 mg | ORAL_TABLET | Freq: Four times a day (QID) | ORAL | 0 refills | Status: DC | PRN

## 2019-02-06 MED ORDER — HYDROCOD POLST-CPM POLST ER 10-8 MG/5ML PO SUER: 5.0000 mL | Freq: Two times a day (BID) | ORAL | 0 refills | Status: DC | PRN

## 2019-02-06 MED ORDER — POLYETHYLENE GLYCOL 3350 17 G PO PACK: 17.0000 g | PACK | Freq: Every day | ORAL | 0 refills | Status: DC | PRN

## 2019-02-06 MED ORDER — IPRATROPIUM-ALBUTEROL 20-100 MCG/ACT IN AERS: 1.0000 | INHALATION_SPRAY | Freq: Four times a day (QID) | RESPIRATORY_TRACT | 0 refills | Status: DC

## 2019-02-06 MED ORDER — GUAIFENESIN-DM 100-10 MG/5ML PO SYRP: 10.0000 mL | ORAL_SOLUTION | ORAL | 0 refills | Status: DC | PRN

## 2019-02-06 MED ORDER — SODIUM CHLORIDE 0.9 % IV SOLN: 200.0000 mg | Freq: Once | INTRAVENOUS | Status: DC

## 2019-02-06 MED ORDER — ONDANSETRON HCL 4 MG/2ML IJ SOLN: 4.0000 mg | Freq: Four times a day (QID) | INTRAMUSCULAR | Status: DC | PRN

## 2019-02-06 MED ORDER — SENNA 8.6 MG PO TABS
1.0000 | ORAL_TABLET | Freq: Every evening | ORAL | Status: DC | PRN
  Administered 2019-02-06: 8.6 mg via ORAL
  Filled 2019-02-06: qty 1

## 2019-02-06 MED ORDER — DIPHENHYDRAMINE HCL 50 MG/ML IJ SOLN
25.0000 mg | Freq: Once | INTRAMUSCULAR | Status: AC
  Administered 2019-02-06: 22:00:00 25 mg via INTRAVENOUS
  Filled 2019-02-06: qty 1

## 2019-02-06 MED ORDER — POLYETHYLENE GLYCOL 3350 17 G PO PACK
17.0000 g | PACK | Freq: Every day | ORAL | Status: DC | PRN
  Administered 2019-02-07 – 2019-02-08 (×2): 17 g via ORAL
  Filled 2019-02-06 (×2): qty 1

## 2019-02-06 MED ORDER — METHYLPREDNISOLONE SODIUM SUCC 125 MG IJ SOLR
0.5000 mg/kg | Freq: Two times a day (BID) | INTRAMUSCULAR | Status: DC
  Administered 2019-02-06 – 2019-02-09 (×7): 45.625 mg via INTRAVENOUS
  Filled 2019-02-06 (×7): qty 2

## 2019-02-06 MED ORDER — LORATADINE 10 MG PO TABS
10.0000 mg | ORAL_TABLET | Freq: Every day | ORAL | Status: DC
  Administered 2019-02-07 – 2019-02-11 (×5): 10 mg via ORAL
  Filled 2019-02-06 (×5): qty 1

## 2019-02-06 MED ORDER — HYDROCOD POLST-CPM POLST ER 10-8 MG/5ML PO SUER
5.0000 mL | Freq: Two times a day (BID) | ORAL | Status: DC | PRN
  Administered 2019-02-06 – 2019-02-10 (×6): 5 mL via ORAL
  Filled 2019-02-06 (×6): qty 5

## 2019-02-06 MED ORDER — PANTOPRAZOLE SODIUM 40 MG PO TBEC: 40.0000 mg | DELAYED_RELEASE_TABLET | Freq: Every day | ORAL | Status: DC

## 2019-02-06 MED ORDER — ZINC SULFATE 220 (50 ZN) MG PO CAPS: 220.0000 mg | ORAL_CAPSULE | Freq: Every day | ORAL | 0 refills | Status: DC

## 2019-02-06 MED ORDER — ENOXAPARIN SODIUM 40 MG/0.4ML ~~LOC~~ SOLN
40.0000 mg | Freq: Two times a day (BID) | SUBCUTANEOUS | Status: DC
  Administered 2019-02-06 – 2019-02-09 (×7): 40 mg via SUBCUTANEOUS
  Filled 2019-02-06 (×8): qty 0.4

## 2019-02-06 MED ORDER — HYDROCOD POLST-CPM POLST ER 10-8 MG/5ML PO SUER: 5.0000 mL | Freq: Four times a day (QID) | ORAL | Status: DC | PRN

## 2019-02-06 MED ORDER — GUAIFENESIN-DM 100-10 MG/5ML PO SYRP
10.0000 mL | ORAL_SOLUTION | ORAL | Status: DC | PRN
  Administered 2019-02-08: 21:00:00 10 mL via ORAL
  Filled 2019-02-06: qty 10

## 2019-02-06 MED ORDER — ONDANSETRON HCL 4 MG PO TABS: 4.0000 mg | ORAL_TABLET | Freq: Four times a day (QID) | ORAL | Status: DC | PRN

## 2019-02-06 MED ORDER — TOCILIZUMAB 400 MG/20ML IV SOLN
8.0000 mg/kg | Freq: Once | INTRAVENOUS | Status: AC
  Administered 2019-02-06: 18:00:00 728 mg via INTRAVENOUS
  Filled 2019-02-06: qty 36.4

## 2019-02-06 MED ORDER — SIMVASTATIN 20 MG PO TABS
40.0000 mg | ORAL_TABLET | Freq: Every day | ORAL | Status: DC
  Administered 2019-02-06 – 2019-02-10 (×5): 40 mg via ORAL
  Filled 2019-02-06 (×3): qty 2
  Filled 2019-02-06: qty 1
  Filled 2019-02-06 (×2): qty 2

## 2019-02-06 MED ORDER — ASCORBIC ACID 500 MG PO TABS: 500.0000 mg | ORAL_TABLET | Freq: Every day | ORAL | 0 refills | Status: DC

## 2019-02-06 MED ORDER — IPRATROPIUM-ALBUTEROL 20-100 MCG/ACT IN AERS
1.0000 | INHALATION_SPRAY | Freq: Four times a day (QID) | RESPIRATORY_TRACT | Status: DC
  Administered 2019-02-06 – 2019-02-11 (×19): 1 via RESPIRATORY_TRACT
  Filled 2019-02-06: qty 4

## 2019-02-06 MED ORDER — PANTOPRAZOLE SODIUM 40 MG PO TBEC: 40.0000 mg | DELAYED_RELEASE_TABLET | Freq: Every day | ORAL | 0 refills | Status: DC

## 2019-02-06 MED ORDER — SODIUM CHLORIDE 0.9 % IV SOLN
100.0000 mg | Freq: Every day | INTRAVENOUS | Status: AC
  Administered 2019-02-07 – 2019-02-08 (×2): 100 mg via INTRAVENOUS

## 2019-02-06 MED ORDER — SODIUM CHLORIDE 0.9 % IV SOLN: 250.0000 mL | INTRAVENOUS | Status: DC | PRN

## 2019-02-06 MED ORDER — ENOXAPARIN SODIUM 100 MG/ML ~~LOC~~ SOLN: 1.0000 mg/kg | Freq: Two times a day (BID) | SUBCUTANEOUS | 0 refills | Status: DC

## 2019-02-06 MED ORDER — ACETAMINOPHEN 325 MG PO TABS
650.0000 mg | ORAL_TABLET | Freq: Four times a day (QID) | ORAL | Status: DC | PRN
  Administered 2019-02-06 – 2019-02-10 (×6): 650 mg via ORAL
  Filled 2019-02-06 (×6): qty 2

## 2019-02-06 MED ORDER — SODIUM CHLORIDE 0.9% IV SOLUTION: Freq: Once | INTRAVENOUS | Status: AC

## 2019-02-06 NOTE — Progress Notes (Signed)
Report called to Ubaldo Glassing, Therapist, sports at [facility]. EMTALA completed and signed by patient.  Patient transported to [facility] via [facility]. Clarise Cruz, BSN

## 2019-02-06 NOTE — Progress Notes (Signed)
Patient's husband Leslie Koch updated.  Leslie Koch, BSN

## 2019-02-06 NOTE — Discharge Summary (Signed)
Physician Discharge Summary  Leslie Koch [id number] DOB: 08-08-1955 DOA: 02/04/2019  PCP: Mar Daring, [name]  Admit date: 02/04/2019 Discharge date: 02/06/2019  Admitted From: Home Disposition: [facility]   Equipment/Devices: On 10 L high flow nasal cannula   Discharge Condition: Guarded CODE STATUS: Full code Diet recommendation: Regular   Discharge Diagnoses:  Principal Problem:   Acute respiratory failure with hypoxia (HCC)   Active Problems:   Acid reflux   Hypercholesteremia   Chronic venous insufficiency   Pneumonia due to COVID-19 virus   Essential hypertension   Acute respiratory disease due to COVID-19 virus  Brief narrative/HPI 64 year old obese female with history of hypertension and GERD presented to the ED with worsening shortness of breath with productive cough.  She works at [employer] skilled facility and reports being exposed to coworkers who got infected with COVID-19.  She received her first dose of vaccine on 1/11 after which she had a scratchy throat 2 days later.  She had a rapid Covid test done on 1/12 followed by 1/19 at her workplace which was both negative.  However since 2 days prior to admission she was having fevers with chills, malaise, increasing shortness of breath with productive cough and headaches.  Also reported nausea and vomiting with poor appetite. In the ED O2 sat dropped to the 80s requiring 2 L via nasal cannula.  Respiratory viral panel was positive for COVID-19.  She had elevated lactic acid of 2.2 with chest x-ray showing bilateral infiltrate.  Fibrin derivative D-dimer was elevated at 945.  Admitted for further management for COVID-19 infection with respiratory failure.  Hospital course   Principal problem Acute respiratory failure with hypoxia ([place])  secondary to COVID-19 infection.  Maintain airborne and contact precaution.  Continue Decadron and IV remdesivir (day 3).  Supportive care with  antitussives, albuterol inhaler, vitamin C and zinc.  Respiratory symptoms continue to worsen and now requiring 10 L via high flow nasal cannula.  Received a dose of IV Actemra on 1/25. Her fibrin derivative D-dimer is slightly improved in a.m. lab.  CRP is pending.  Given progressively worsening respiratory status she would benefit from another dose of IV Actemra.  This can be administered once she gets to [facility] this morning.  Patient was hesitant to go to [facility] yesterday but now agrees since her symptoms have worsened.  Active problems Essential hypertension Continue losartan/HCTZ.  Continue Lasix.  Hyperlipidemia Continue statin      Code Status : Full code  Family Communication  : Husband updated  Disposition Plan  :  Transfer to [facility]  Barriers For Discharge : Active hypoxia  Consults  : None  Procedures  : None  Discharge Instructions   Allergies as of 02/06/2019      Reactions   Augmentin [amoxicillin-pot Clavulanate]    diarrhea   Montelukast Sodium Rash      Medication List    STOP taking these medications   albuterol 108 (90 Base) MCG/ACT inhaler Commonly known as: VENTOLIN HFA   celecoxib 200 MG capsule Commonly known as: CELEBREX   HYDROcodone-homatropine 5-1.5 MG/5ML syrup Commonly known as: HYCODAN   Omeprazole 20 MG Tbec     TAKE these medications   acetaminophen 325 MG tablet Commonly known as: TYLENOL Take 2 tablets (650 mg total) by mouth every 6 (six) hours as needed for mild pain or headache (fever >/= 101).   ascorbic acid 500 MG tablet Commonly known as: VITAMIN  C Take 1 tablet (500 mg total) by mouth daily. Start taking on: February 07, 2019   aspirin EC 81 MG tablet Take 81 mg by mouth daily.   CALCIUM 500/D PO Take 1 tablet by mouth daily.   chlorpheniramine-HYDROcodone 10-8 MG/5ML Suer Commonly known as: TUSSIONEX Take 5 mLs by mouth every 12 (twelve) hours as  needed for cough.   dexamethasone 6 MG tablet Commonly known as: DECADRON Take 1 tablet (6 mg total) by mouth daily. Start taking on: February 07, 2019   enoxaparin 100 MG/ML injection Commonly known as: LOVENOX Inject 0.9 mLs (90 mg total) into the skin every 12 (twelve) hours.   estradiol 1 MG tablet Commonly known as: ESTRACE TAKE 1 TABLET BY MOUTH  DAILY   furosemide 40 MG tablet Commonly known as: LASIX Take 1 tablet (40 mg total) by mouth 2 (two) times daily as needed. What changed:   how much to take  when to take this   Glucosamine-Chondroitin 750-600 MG Tabs Take 1 tablet by mouth as directed.   guaiFENesin-dextromethorphan 100-10 MG/5ML syrup Commonly known as: ROBITUSSIN DM Take 10 mLs by mouth every 4 (four) hours as needed for cough.   Ipratropium-Albuterol 20-100 MCG/ACT Aers respimat Commonly known as: COMBIVENT Inhale 1 puff into the lungs every 6 (six) hours.   lisinopril-hydrochlorothiazide 20-12.5 MG tablet Commonly known as: ZESTORETIC TAKE 1 TABLET BY MOUTH  DAILY   multivitamin tablet Take 1 tablet by mouth daily.   Omega-3 1000 MG Caps Take 2,000 mg by mouth daily.   oxymetazoline 0.05 % nasal spray Commonly known as: AFRIN Place 1-2 sprays into both nostrils 2 (two) times daily as needed for congestion.   pantoprazole 40 MG tablet Commonly known as: PROTONIX Take 1 tablet (40 mg total) by mouth daily.   polyethylene glycol 17 g packet Commonly known as: MIRALAX / GLYCOLAX Take 17 g by mouth daily as needed for mild constipation.   potassium chloride 10 MEQ tablet Commonly known as: KLOR-CON Take 1 tablet (10 mEq total) by mouth 2 (two) times daily as needed. What changed: when to take this   senna-docusate 8.6-50 MG tablet Commonly known as: Senokot-S Take 1 tablet by mouth at bedtime.   simvastatin 40 MG tablet Commonly known as: ZOCOR Take 1 tablet (40 mg total) by mouth at bedtime.   valACYclovir 1000 MG tablet Commonly  known as: Valtrex Take 1-2 tablets (1,000-2,000 mg total) by mouth every 12 (twelve) hours as needed.   Vitamin D3 25 MCG (1000 UT) Caps Take 1,000 Units by mouth daily.   zinc sulfate 220 (50 Zn) MG capsule Take 1 capsule (220 mg total) by mouth daily. Start taking on: February 07, 2019   ZyrTEC Allergy 10 MG tablet Generic drug: cetirizine Take 10 mg by mouth daily.       Allergies  Allergen Reactions  . Augmentin [Amoxicillin-Pot Clavulanate]     diarrhea  . Montelukast Sodium Rash     Procedures/Studies: DG Chest Portable 1 View  Result Date: 02/04/2019 CLINICAL DATA:  Shortness of breath. EXAM: PORTABLE CHEST 1 VIEW COMPARISON:  Chest x-ray dated 01/15/2008. FINDINGS: Heart size and mediastinal contours are within normal limits. Patchy peripheral airspace opacities, LEFT slightly greater than RIGHT. No pleural effusion or pneumothorax is seen. Osseous structures about the chest are unremarkable. IMPRESSION: Patchy peripheral airspace opacities, LEFT slightly greater than RIGHT, compatible with multifocal pneumonia versus pulmonary edema. Electronically Signed   By: Franki Cabot M.D.   On: 02/04/2019 13:16  Subjective: Feels her breathing has not improved and still congested and unable to cough up any phlegm. Discharge Exam: Vitals:   02/06/19 0330 02/06/19 0751  BP:  (!) 130/58  Pulse:  73  Resp: (!) 21 19  Temp:  98.5 F (36.9 C)  SpO2:  99%   Vitals:   02/05/19 2315 02/05/19 2316 02/06/19 0330 02/06/19 0751  BP: 137/85   (!) 130/58  Pulse: 87   73  Resp:  20 (!) 21 19  Temp:  99.7 F (37.6 C)  98.5 F (36.9 C)  TempSrc:  Oral  Oral  SpO2: (!) 89% 92%  99%  Weight:      Height:        General: Middle-aged obese female, fatigued and appears uncomfortable HEENT: Moist mucosa, supple neck Chest: Coarse bibasilar breath sound CVS: Normal S1-S2 GI: Soft, nondistended, nontender Musculoskeletal: Warm, no edema    The results of significant  diagnostics from this hospitalization (including imaging, microbiology, ancillary and laboratory) are listed below for reference.     Microbiology: Recent Results (from the past 240 hour(s))  Blood culture (routine x 2)     Status: None (Preliminary result)   Collection Time: 02/04/19  1:30 PM   Specimen: BLOOD  Result Value Ref Range Status   Specimen Description BLOOD R AC  Final   Special Requests   Final    BOTTLES DRAWN AEROBIC AND ANAEROBIC Blood Culture results may not be optimal due to an excessive volume of blood received in culture bottles   Culture   Final    NO GROWTH 2 DAYS Performed at [facility], [address], [place], [place] [address]    Report Status PENDING  Incomplete  Respiratory Panel by RT PCR (Flu A&B, Covid) - Nasopharyngeal Swab     Status: Abnormal   Collection Time: 02/04/19  1:36 PM   Specimen: Nasopharyngeal Swab  Result Value Ref Range Status   SARS Coronavirus 2 by RT PCR POSITIVE (A) NEGATIVE Final    Comment: RESULT CALLED TO, READ BACK BY AND VERIFIED WITH: ASHLEY ORSUTO ON 02/04/2019 AT [id number] TIK (NOTE) SARS-CoV-2 target nucleic acids are DETECTED. SARS-CoV-2 RNA is generally detectable in upper respiratory specimens  during the acute phase of infection. Positive results are indicative of the presence of the identified virus, but do not rule out bacterial infection or co-infection with other pathogens not detected by the test. Clinical correlation with patient history and other diagnostic information is necessary to determine patient infection status. The expected result is Negative. Fact Sheet for Patients:  PinkCheek.be Fact Sheet for Healthcare Providers: GravelBags.it This test is not yet approved or cleared by the [place] FDA and  has been authorized for detection and/or diagnosis of SARS-CoV-2 by FDA under an Emergency Use Authorization (EUA).  This EUA  will remain in effect (meaning this test can be use d) for the duration of  the COVID-19 declaration under Section 564(b)(1) of the Act, 21 U.S.C. section 360bbb-3(b)(1), unless the authorization is terminated or revoked sooner.    Influenza A by PCR NEGATIVE NEGATIVE Final   Influenza B by PCR NEGATIVE NEGATIVE Final    Comment: (NOTE) The Xpert Xpress SARS-CoV-2/FLU/RSV assay is intended as an aid in  the diagnosis of influenza from Nasopharyngeal swab specimens and  should not be used as a sole basis for treatment. Nasal washings and  aspirates are unacceptable for Xpert Xpress SARS-CoV-2/FLU/RSV  testing. Fact Sheet for Patients: PinkCheek.be Fact Sheet for Healthcare Providers:  GravelBags.it This test is not yet approved or cleared by the [place] and  has been authorized for detection and/or diagnosis of SARS-CoV-2 by  FDA under an Emergency Use Authorization (EUA). This EUA will remain  in effect (meaning this test can be used) for the duration of the  Covid-19 declaration under Section 564(b)(1) of the Act, 21  U.S.C. section 360bbb-3(b)(1), unless the authorization is  terminated or revoked. Performed at [facility], [address] [place], [place] [address]   Blood culture (routine x 2)     Status: None (Preliminary result)   Collection Time: 02/04/19  2:51 PM   Specimen: BLOOD  Result Value Ref Range Status   Specimen Description BLOOD BLOOD RIGHT HAND  Final   Special Requests   Final    BOTTLES DRAWN AEROBIC AND ANAEROBIC Blood Culture results may not be optimal due to an inadequate volume of blood received in culture bottles   Culture   Final    NO GROWTH 2 DAYS Performed at [facility], [address] [place], [place] [address]    Report Status PENDING  Incomplete     Labs: BNP (last 3 results) No results for input(s): BNP in the last 8760 hours. Basic Metabolic  Panel: Recent Labs  Lab 02/04/19 1333 02/05/19 0436 02/06/19 0633  NA 134* 139 139  K 4.5 4.3 3.6  CL 95* 106 102  CO2 28 25 27   GLUCOSE 103* 113* 87  BUN 17 12 15   CREATININE 0.86 0.58 0.67  CALCIUM 8.6* 8.0* 8.3*  MG  --  2.2 1.9  PHOS  --  2.6 2.7   Liver Function Tests: Recent Labs  Lab 02/04/19 1333 02/05/19 0436 02/06/19 0633  AST 30 30 35  ALT 18 18 20   ALKPHOS 77 64 56  BILITOT 0.9 0.4 0.6  PROT 7.2 6.1* 6.1*  ALBUMIN 2.8* 2.5* 2.5*   No results for input(s): LIPASE, AMYLASE in the last 168 hours. No results for input(s): AMMONIA in the last 168 hours. CBC: Recent Labs  Lab 02/04/19 1333 02/05/19 0436 02/06/19 0633  WBC 8.1 3.4* 5.8  NEUTROABS 6.4 2.4 3.8  HGB 13.8 11.7* 12.3  HCT 41.5 34.0* 36.7  MCV 92.6 91.6 92.0  PLT 307 293 403*   Cardiac Enzymes: No results for input(s): CKTOTAL, CKMB, CKMBINDEX, TROPONINI in the last 168 hours. BNP: Invalid input(s): POCBNP CBG: No results for input(s): GLUCAP in the last 168 hours. D-Dimer No results for input(s): DDIMER in the last 72 hours. Hgb A1c No results for input(s): HGBA1C in the last 72 hours. Lipid Profile No results for input(s): CHOL, HDL, LDLCALC, TRIG, CHOLHDL, LDLDIRECT in the last 72 hours. Thyroid function studies No results for input(s): TSH, T4TOTAL, T3FREE, THYROIDAB in the last 72 hours.  Invalid input(s): FREET3 Anemia work up Recent Labs    02/05/19 0436 02/06/19 0633  FERRITIN 279 307   Urinalysis    Component Value Date/Time   COLORURINE YELLOW (A) 02/04/2019 1731   APPEARANCEUR HAZY (A) 02/04/2019 1731   APPEARANCEUR Clear 11/20/2013 1241   LABSPEC 1.011 02/04/2019 1731   LABSPEC 1.015 11/20/2013 1241   PHURINE 6.0 02/04/2019 1731   GLUCOSEU NEGATIVE 02/04/2019 1731   GLUCOSEU Negative 11/20/2013 1241   HGBUR NEGATIVE 02/04/2019 1731   BILIRUBINUR NEGATIVE 02/04/2019 1731   BILIRUBINUR Negative 11/20/2013 1241   [place] 02/04/2019 1731   PROTEINUR  NEGATIVE 02/04/2019 1731   NITRITE NEGATIVE 02/04/2019 1731   LEUKOCYTESUR TRACE (A)  02/04/2019 1731   LEUKOCYTESUR Trace 11/20/2013 1241   Sepsis Labs Invalid input(s): PROCALCITONIN,  WBC,  LACTICIDVEN Microbiology Recent Results (from the past 240 hour(s))  Blood culture (routine x 2)     Status: None (Preliminary result)   Collection Time: 02/04/19  1:30 PM   Specimen: BLOOD  Result Value Ref Range Status   Specimen Description BLOOD R AC  Final   Special Requests   Final    BOTTLES DRAWN AEROBIC AND ANAEROBIC Blood Culture results may not be optimal due to an excessive volume of blood received in culture bottles   Culture   Final    NO GROWTH 2 DAYS Performed at [facility], [address], [place], [place] [address]    Report Status PENDING  Incomplete  Respiratory Panel by RT PCR (Flu A&B, Covid) - Nasopharyngeal Swab     Status: Abnormal   Collection Time: 02/04/19  1:36 PM   Specimen: Nasopharyngeal Swab  Result Value Ref Range Status   SARS Coronavirus 2 by RT PCR POSITIVE (A) NEGATIVE Final    Comment: RESULT CALLED TO, READ BACK BY AND VERIFIED WITH: ASHLEY ORSUTO ON 02/04/2019 AT [id number] TIK (NOTE) SARS-CoV-2 target nucleic acids are DETECTED. SARS-CoV-2 RNA is generally detectable in upper respiratory specimens  during the acute phase of infection. Positive results are indicative of the presence of the identified virus, but do not rule out bacterial infection or co-infection with other pathogens not detected by the test. Clinical correlation with patient history and other diagnostic information is necessary to determine patient infection status. The expected result is Negative. Fact Sheet for Patients:  PinkCheek.be Fact Sheet for Healthcare Providers: GravelBags.it This test is not yet approved or cleared by the [place] FDA and  has been authorized for detection and/or diagnosis of  SARS-CoV-2 by FDA under an Emergency Use Authorization (EUA).  This EUA will remain in effect (meaning this test can be use d) for the duration of  the COVID-19 declaration under Section 564(b)(1) of the Act, 21 U.S.C. section 360bbb-3(b)(1), unless the authorization is terminated or revoked sooner.    Influenza A by PCR NEGATIVE NEGATIVE Final   Influenza B by PCR NEGATIVE NEGATIVE Final    Comment: (NOTE) The Xpert Xpress SARS-CoV-2/FLU/RSV assay is intended as an aid in  the diagnosis of influenza from Nasopharyngeal swab specimens and  should not be used as a sole basis for treatment. Nasal washings and  aspirates are unacceptable for Xpert Xpress SARS-CoV-2/FLU/RSV  testing. Fact Sheet for Patients: PinkCheek.be Fact Sheet for Healthcare Providers: GravelBags.it This test is not yet approved or cleared by the [place] FDA and  has been authorized for detection and/or diagnosis of SARS-CoV-2 by  FDA under an Emergency Use Authorization (EUA). This EUA will remain  in effect (meaning this test can be used) for the duration of the  Covid-19 declaration under Section 564(b)(1) of the Act, 21  U.S.C. section 360bbb-3(b)(1), unless the authorization is  terminated or revoked. Performed at [facility], [address] [place], [place] [address]   Blood culture (routine x 2)     Status: None (Preliminary result)   Collection Time: 02/04/19  2:51 PM   Specimen: BLOOD  Result Value Ref Range Status   Specimen Description BLOOD BLOOD RIGHT HAND  Final   Special Requests   Final    BOTTLES DRAWN AEROBIC AND ANAEROBIC Blood Culture results may not be optimal due to an inadequate volume of blood received in culture  bottles   Culture   Final    NO GROWTH 2 DAYS Performed at [facility], [address] [place], [place] [address]    Report Status PENDING  Incomplete     Time coordinating discharge: 35  minutes  SIGNED:   Louellen Molder, MD  [facility] 02/06/2019, 9:57 AM Pager   If 7PM-7AM, please contact night-coverage [email or web address] Password [email or web address]

## 2019-02-06 NOTE — H&P (Addendum)
Seen earlier this morning by Dr. Lodema Hong see his discharge summary for details.  Brief narrative: 64 year old female with HTN, GERD, dyslipidemia, obesity-who was exposed to COVID-19 at her workplace-s/p first dose of Covid 19 vaccine on 1/11-presented to the hospital with 2-day history of fever, chills, malaise-was COVID-19 positive-and subsequently found to have acute hypoxic respiratory failure secondary to pneumonia.  She was admitted over at [facility] developed worsening hypoxemia-and was transferred to [facility] for further management and evaluation.  Significant events: 1/11: First dose of COVID-19 vaccine 1/12: Reported negative Covid test as outpatient 1/19: Reported negative Covid test as outpatient 1/24: Covid positive-and admitted to [facility] for hypoxemia due to COVID-19 pneumonia 1/26: Transferred to [facility] for worsening hypoxemia on 10 L of oxygen via HFNC  COVID-19 medications: Steroids: 1/24>> Remdesivir: 1/24>> Actemra: 1/25 x 1, 1/26 x1 Plasma: 1/26 x1  DVT prophylaxis: Lovenox-intermediate dosing  Subjective: Lying comfortably in bed.Gets SOB with minimal activity  Objective: Blood pressure 131/74, pulse 66, temperature 97.9 F (36.6 C), temperature source Oral, resp. rate 16, height 5\' 1"  (1.549 m), weight 91 kg, SpO2 92 %.   Recent Labs    02/05/19 0436 02/06/19 0633  FERRITIN 279 307  CRP 19.0*  --     Lab Results  Component Value Date   SARSCOV2NAA POSITIVE (A) 02/04/2019   Assessment and plan: Acute hypoxic respiratory failure secondary to COVID-19 pneumonia: Continue supportive care-we will switch from Decadron to Solu-Medrol-continue remdesivir.Still very hypoxic with minimal activity-long d/w patient regarding convalescent plasma use-has consented. Also consents to off lablel use of Actemra ( 2nd dose)  The rationale for the off label use of Actemra its known side effects, potential benefits was  discussed with patient .The use of Actemra is based on  published clinical articles/anecdotal data as several randomized trials have been negative, but lately there have been a few positive randomized trials as well.  There is no history of tuberculosis, hepatitis B or C.  Complete risks and long-term side effects are unknown, however in the best clinical judgment it is felt that the clinical benefit at this time outweighs medical risks given tenuous clinical state of the patient.  Patient agrees with the treatment plan and consent to the use of Actemra.    HTN: Controlled-continue lisinopril  Dyslipidemia: Continue statin   No charge note

## 2019-02-06 NOTE — Evaluation (Signed)
PT Cancellation Note  Patient Details Name: Leslie Koch MRN: [id number] DOB: May 25, 1955   Cancelled Treatment:    Reason Eval/Treat Not Completed: Fatigue/lethargy limiting ability to participate Arrived in pt room to complete assessment but pt reports she is too fatigued to participate at this time, will f/u tomorrow.  Horald Chestnut, PT    Delford Field 02/06/2019, 3:21 PM

## 2019-02-06 NOTE — Progress Notes (Signed)
Plasma x 1 completed. VSS. Temp afebrile. Lasix given post transfusion and benadryl was given prior to transfusion as documented in [facility]. No adverse reactions or transfusion reactions noted.

## 2019-02-07 DIAGNOSIS — E7800 Pure hypercholesterolemia, unspecified: Secondary | ICD-10-CM

## 2019-02-07 DIAGNOSIS — U071 COVID-19: Principal | ICD-10-CM

## 2019-02-07 DIAGNOSIS — J1282 Pneumonia due to coronavirus disease 2019: Secondary | ICD-10-CM

## 2019-02-07 DIAGNOSIS — I10 Essential (primary) hypertension: Secondary | ICD-10-CM

## 2019-02-07 LAB — CBC WITH DIFFERENTIAL/PLATELET
Abs Immature Granulocytes: 0.11 10*3/uL — ABNORMAL HIGH (ref 0.00–0.07)
Basophils Absolute: 0 10*3/uL (ref 0.0–0.1)
Basophils Relative: 1 %
Eosinophils Absolute: 0 10*3/uL (ref 0.0–0.5)
Eosinophils Relative: 0 %
HCT: 37.8 % (ref 36.0–46.0)
Hemoglobin: 13.1 g/dL (ref 12.0–15.0)
Immature Granulocytes: 2 %
Lymphocytes Relative: 16 %
Lymphs Abs: 0.9 10*3/uL (ref 0.7–4.0)
MCH: 31.9 pg (ref 26.0–34.0)
MCHC: 34.7 g/dL (ref 30.0–36.0)
MCV: 92 fL (ref 80.0–100.0)
Monocytes Absolute: 0.3 10*3/uL (ref 0.1–1.0)
Monocytes Relative: 5 %
Neutro Abs: 4.3 10*3/uL (ref 1.7–7.7)
Neutrophils Relative %: 76 %
Platelets: 499 10*3/uL — ABNORMAL HIGH (ref 150–400)
RBC: 4.11 MIL/uL (ref 3.87–5.11)
RDW: 12.4 % (ref 11.5–15.5)
WBC: 5.6 10*3/uL (ref 4.0–10.5)
nRBC: 0 % (ref 0.0–0.2)

## 2019-02-07 LAB — COMPREHENSIVE METABOLIC PANEL
ALT: 51 U/L — ABNORMAL HIGH (ref 0–44)
AST: 60 U/L — ABNORMAL HIGH (ref 15–41)
Albumin: 3.2 g/dL — ABNORMAL LOW (ref 3.5–5.0)
Alkaline Phosphatase: 70 U/L (ref 38–126)
Anion gap: 16 — ABNORMAL HIGH (ref 5–15)
BUN: 15 mg/dL (ref 8–23)
CO2: 26 mmol/L (ref 22–32)
Calcium: 8.8 mg/dL — ABNORMAL LOW (ref 8.9–10.3)
Chloride: 97 mmol/L — ABNORMAL LOW (ref 98–111)
Creatinine, Ser: 0.68 mg/dL (ref 0.44–1.00)
GFR calc Af Amer: >60 mL/min (ref >60)
GFR calc non Af Amer: >60 mL/min (ref >60)
Glucose, Bld: 133 mg/dL — ABNORMAL HIGH (ref 70–99)
Potassium: 3.8 mmol/L (ref 3.5–5.1)
Sodium: 139 mmol/L (ref 135–145)
Total Bilirubin: 0.7 mg/dL (ref 0.3–1.2)
Total Protein: 7.1 g/dL (ref 6.5–8.1)

## 2019-02-07 LAB — TYPE AND SCREEN
ABO/RH(D): A POS
Antibody Screen: NEGATIVE

## 2019-02-07 LAB — BPAM FFP
Blood Product Expiration Date: 202101271740
ISSUE DATE / TIME: 202101261901
Unit Type and Rh: 6200

## 2019-02-07 LAB — FERRITIN: Ferritin: 284 ng/mL (ref 11–307)

## 2019-02-07 LAB — C-REACTIVE PROTEIN: CRP: 5.1 mg/dL — ABNORMAL HIGH (ref <1.0)

## 2019-02-07 LAB — PREPARE FRESH FROZEN PLASMA: Unit division: 0

## 2019-02-07 LAB — D-DIMER, QUANTITATIVE: D-Dimer, Quant: 0.64 ug/mL-FEU — ABNORMAL HIGH (ref 0.00–0.50)

## 2019-02-07 LAB — GLUCOSE, CAPILLARY
Glucose-Capillary: 120 mg/dL — ABNORMAL HIGH (ref 70–99)
Glucose-Capillary: 97 mg/dL (ref 70–99)
Glucose-Capillary: 143 mg/dL — ABNORMAL HIGH (ref 70–99)
Glucose-Capillary: 158 mg/dL — ABNORMAL HIGH (ref 70–99)

## 2019-02-07 MED ORDER — PANTOPRAZOLE SODIUM 20 MG PO TBEC: 20.0000 mg | DELAYED_RELEASE_TABLET | Freq: Every day | ORAL | Status: DC

## 2019-02-07 MED ORDER — FUROSEMIDE 10 MG/ML IJ SOLN
40.0000 mg | Freq: Once | INTRAMUSCULAR | Status: AC
  Administered 2019-02-07: 13:00:00 40 mg via INTRAVENOUS
  Filled 2019-02-07: qty 4

## 2019-02-07 MED ORDER — PANTOPRAZOLE SODIUM 40 MG PO TBEC
40.0000 mg | DELAYED_RELEASE_TABLET | Freq: Every day | ORAL | Status: DC
  Administered 2019-02-07 (×2): 40 mg via ORAL
  Filled 2019-02-07 (×2): qty 1

## 2019-02-07 NOTE — Evaluation (Signed)
Physical Therapy Evaluation Patient Details Name: Leslie Koch MRN: [id number] DOB: 1955-02-04 Today's Date: 02/07/2019   History of Present Illness  Covid/PNA ; PMH includes HN and dyslipidemia- patient also indicates she has had knee replacements with one revision.  Clinical Impression  Very pleasant and receptive female patient. She presents as motivated. She is a transfer from [facility]. She lives with a spouse in one story home, 3 outside steps with handrails. Tub-shower combo, HH shower head. Standard commode height. She has "a lot of equipment from the previous knee surgeries", but does not use any AD for ambulation in PLOF. She desats easily even though she is on HFNC- and was not on oxygen at home. She was independent in all ADLs. She  Should benefit from PT to address goals as outlined for optimal functional outcomes.     Follow Up Recommendations Home health PT    Equipment Recommendations       Recommendations for Other Services       Precautions / Restrictions Precautions Precautions: Fall Precaution Comments: Monitor closely for desats- she fluctuated between 87-92% throughout PTeval and treat session Restrictions Weight Bearing Restrictions: No      Mobility  Bed Mobility Overal bed mobility: Modified Independent                Transfers Overall transfer level: Needs assistance(only with lines) Equipment used: None Transfers: Sit to/from Stand Sit to Stand: Supervision         General transfer comment: Close supervision to safely handle all lines  Ambulation/Gait Ambulation/Gait assistance: Min guard;Supervision Gait Distance (Feet): 7 Feet Assistive device: None Gait Pattern/deviations: Shuffle;Wide base of support     General Gait Details: Just between bed and BS chair, SPO2 dropped to 87%  Stairs            Wheelchair Mobility    Modified Rankin (Stroke Patients Only)       Balance Overall balance assessment: Mild deficits  observed, not formally tested                                           Pertinent Vitals/Pain Pain Assessment: No/denies pain    Home Living Family/patient expects to be discharged to:: Private residence Living Arrangements: Spouse/significant other   Type of Home: House Home Access: Stairs to enter Entrance Stairs-Rails: Left;Right;Can reach both Technical brewer of Steps: 3 with handrails Home Layout: One level   Additional Comments: Has equipment from previous knee surgeries, but does not use any AD for ambulation at home. Indicated that she needs a tub bench.    Prior Function Level of Independence: Independent               Hand Dominance   Dominant Hand: Right    Extremity/Trunk Assessment   Upper Extremity Assessment Upper Extremity Assessment: Defer to OT evaluation    Lower Extremity Assessment Lower Extremity Assessment: Generalized weakness    Cervical / Trunk Assessment Cervical / Trunk Assessment: Normal  Communication   Communication: No difficulties  Cognition Arousal/Alertness: Awake/alert Behavior During Therapy: WFL for tasks assessed/performed Overall Cognitive Status: Within Functional Limits for tasks assessed                                 General Comments: She is very pleasant, cooperative, and receptive to therapy.  States her spouse and another family member are waiting for their COVID test results.      General Comments General comments (skin integrity, edema, etc.): Fair tone and turgor, no edema noted.    Exercises General Exercises - Lower Extremity Ankle Circles/Pumps: Seated;AROM(She is very familiar due to having had knee replacements.) Quad Sets: AROM;Seated Short Arc Quad: AROM;Seated Hip Flexion/Marching: AROM;Seated Other Exercises Other Exercises: Practiced with IS and Flutter Valve unit. Able to achieve 750 on the IS fior 7 of 8 attempts. Good return demo with the FLUTTER.  Frequent coughing.   Assessment/Plan    PT Assessment Patient needs continued PT services  PT Problem List Decreased strength;Decreased activity tolerance;Decreased mobility;Cardiopulmonary status limiting activity       PT Treatment Interventions Gait training;Stair training;Functional mobility training;Patient/family education;Therapeutic exercise;Therapeutic activities    PT Goals (Current goals can be found in the Care Plan section)  Acute Rehab PT Goals Patient Stated Goal: Just want to be careful and take time to get well so I can get home with my family PT Goal Formulation: With patient Time For Goal Achievement: 02/07/19 Potential to Achieve Goals: Good    Frequency Min 3X/week   Barriers to discharge        Co-evaluation               AM-PAC PT "6 Clicks" Mobility  Outcome Measure Help needed turning from your back to your side while in a flat bed without using bedrails?: None Help needed moving from lying on your back to sitting on the side of a flat bed without using bedrails?: None Help needed moving to and from a bed to a chair (including a wheelchair)?: A Little Help needed standing up from a chair using your arms (e.g., wheelchair or bedside chair)?: A Little Help needed to walk in hospital room?: A Little   6 Click Score: 17    End of Session     Patient left: in chair;with call bell/phone within reach Nurse Communication: Mobility status PT Visit Diagnosis: Muscle weakness (generalized) (M62.81)    Time: [id number] PT Time Calculation (min) (ACUTE ONLY): 51 min   Charges:   PT Evaluation $PT Eval Moderate Complexity: 1 Mod PT Treatments $Gait Training: 8-22 mins $Therapeutic Exercise: 8-22 mins      Rollen Sox, PT # [phone] CGV cell   Casandra Doffing 02/07/2019, 9:21 AM

## 2019-02-07 NOTE — Progress Notes (Signed)
PROGRESS NOTE                                                                                                                                                                                                             Patient Demographics:    Leslie Koch, is a 64 y.o. female, DOB - 11/26/1955, [id number]  Outpatient Primary MD for the patient is Rubye Beach   Admit date - 02/06/2019   LOS - 1     Brief narrative: 64 year old female with HTN, GERD, dyslipidemia, obesity-who was exposed to COVID-19 at her workplace-s/p Koch dose of Covid 19 vaccine on 1/11-presented to the hospital with 2-day history of fever, chills, malaise-was COVID-19 positive-and subsequently found to have acute hypoxic respiratory failure secondary to pneumonia.  She was admitted over at [facility] developed worsening hypoxemia-and was transferred to [facility] for further management and evaluation.  Significant events: 1/11: Koch dose of COVID-19 vaccine 1/12: Reported negative Covid test as outpatient 1/19: Reported negative Covid test as outpatient 1/24: Covid positive-and admitted to [name] for hypoxemia due to COVID-19 pneumonia 1/26: Transferred to [facility] for worsening hypoxemia on 10 L of oxygen via HFNC  COVID-19 medications: Steroids: 1/24>> Remdesivir: 1/24>> Actemra: 1/25 x 1, 1/26 x1 Plasma: 1/26 x1   Subjective:    Leslie Koch today feels better-she was down to 7-8L  Of HFNC, was on 10L overnight   Assessment  & Plan :   Acute Hypoxic Resp Failure due to Covid 19 Viral pneumonia:slowly improving-down to 7-8L this am. Continue steroids and remdesivir. Follow closely  Fever: afebrile  O2 requirements:  SpO2: 92 % O2 Flow Rate (L/min): 10 L/min   COVID-19 Labs: Recent Labs    02/05/19 0436 02/05/19 0436 02/06/19 0633 02/06/19 1322 02/07/19 0326  DDIMER  --   --   --   --  0.64*  FERRITIN 279  --  307  --  284    CRP 19.0*   < > 8.3* 7.9* 5.1*   < > = values in this interval not displayed.       Component Value Date/Time   BNP 178.8 (H) 02/06/2019 1322    Recent Labs  Lab 02/04/19 1452  PROCALCITON <0.10    Lab Results  Component Value Date   SARSCOV2NAA POSITIVE (A) 02/04/2019     Prone/Incentive Spirometry: encouraged  patient to lie prone for 3-4 hours at a time for a total of 16 hours a day, and to encourage incentive spirometry use 3-4/hour.  DVT Prophylaxis  :  Lovenox-intermediate dosing  HTN: Controlled-continue lisinopril  Dyslipidemia: Continue statin  Obesity: Estimated body mass index is 37.91 kg/m as calculated from the following:   Height as of this encounter: 5\' 1"  (1.549 m).   Weight as of this encounter: 91 kg.   Consults  :  None  Procedures  :  None  ABG: No results found for: PHART, PCO2ART, PO2ART, HCO3, TCO2, ACIDBASEDEF, O2SAT  Vent Settings: N/A  Condition - Extremely Guarded  Family Communication  :  Spouse updated over the phone  Code Status :  Full Code  Diet :  Diet Order            Diet Heart Room service appropriate? Yes; Fluid consistency: Thin  Diet effective now               Disposition Plan  :  Remain hospitalized-disposition will depend on clinical course.  Barriers to discharge: Hypoxia requiring O2 supplementation/complete 5 days of IV Remdesivir  Antimicorbials  :    Anti-infectives (From admission, onward)   Start     Dose/Rate Route Frequency Ordered Stop   02/07/19 1000  remdesivir 100 mg in sodium chloride 0.9 % 100 mL IVPB  Status:  Discontinued     100 mg 200 mL/hr over 30 Minutes Intravenous Daily 02/06/19 1151 02/06/19 1159   02/07/19 1000  remdesivir 100 mg in sodium chloride 0.9 % 100 mL IVPB     100 mg 200 mL/hr over 30 Minutes Intravenous Daily 02/06/19 1201 02/09/19 0959   02/07/19 1000  remdesivir 100 mg in sodium chloride 0.9 % 100 mL IVPB  Status:  Discontinued     100 mg 200 mL/hr over 30  Minutes Intravenous Daily 02/06/19 1239 02/06/19 1239   02/06/19 1200  remdesivir 200 mg in sodium chloride 0.9% 250 mL IVPB  Status:  Discontinued     200 mg 580 mL/hr over 30 Minutes Intravenous Once 02/06/19 1151 02/06/19 1159      Inpatient Medications  Scheduled Meds: . vitamin C  500 mg Oral Daily  . enoxaparin (LOVENOX) injection  40 mg Subcutaneous Q12H  . insulin aspart  0-9 Units Subcutaneous TID WC  . Ipratropium-Albuterol  1 puff Inhalation Q6H  . lisinopril  20 mg Oral Daily  . loratadine  10 mg Oral Daily  . methylPREDNISolone (SOLU-MEDROL) injection  0.5 mg/kg Intravenous Q12H  . pantoprazole  40 mg Oral Daily  . simvastatin  40 mg Oral QHS  . sodium chloride flush  3 mL Intravenous Q12H  . zinc sulfate  220 mg Oral Daily   Continuous Infusions: . sodium chloride    . remdesivir 100 mg in NS 100 mL 100 mg (02/07/19 0836)   PRN Meds:.sodium chloride, acetaminophen, chlorpheniramine-HYDROcodone, guaiFENesin-dextromethorphan, ondansetron **OR** ondansetron (ZOFRAN) IV, polyethylene glycol, senna, sodium chloride flush    Time Spent in minutes  35   Oren Binet M.D on 02/07/2019 at 12:43 PM  To page go to [email or web address] - use universal password  [facility] -  Office  [phone]    Objective:   Vitals:   02/06/19 2330 02/06/19 2345 02/07/19 0711 02/07/19 0905  BP: 136/70 131/70    Pulse: 73 76 66   Resp: 17 19    Temp:  98.6 F (37 C) 98.4 F (36.9 C)   TempSrc:  Oral  Oral   SpO2: 96% 94%  92%  Weight:      Height:        Wt Readings from Last 3 Encounters:  02/06/19 91 kg  02/04/19 90.7 kg  02/21/17 98.4 kg     Intake/Output Summary (Last 24 hours) at 02/07/2019 1243 Last data filed at 02/06/2019 2345 Gross per 24 hour  Intake 101.37 ml  Output --  Net 101.37 ml     Physical Exam Gen Exam:Alert awake-not in any distress HEENT:atraumatic, normocephalic Chest: B/L clear to auscultation anteriorly CVS:S1S2  regular Abdomen:soft non tender, non distended Extremities:no edema Neurology: Non focal Skin: no rash   Data Review:    CBC Recent Labs  Lab 02/04/19 1333 02/05/19 0436 02/06/19 0633 02/07/19 0326  WBC 8.1 3.4* 5.8 5.6  HGB 13.8 11.7* 12.3 13.1  HCT 41.5 34.0* 36.7 37.8  PLT 307 293 403* 499*  MCV 92.6 91.6 92.0 92.0  MCH 30.8 31.5 30.8 31.9  MCHC 33.3 34.4 33.5 34.7  RDW 12.7 12.4 12.3 12.4  LYMPHSABS 1.0 0.8 1.5 0.9  MONOABS 0.5 0.2 0.5 0.3  EOSABS 0.0 0.0 0.0 0.0  BASOSABS 0.0 0.0 0.0 0.0    Chemistries  Recent Labs  Lab 02/04/19 1333 02/05/19 0436 02/06/19 0633 02/07/19 0326  NA 134* 139 139 139  K 4.5 4.3 3.6 3.8  CL 95* 106 102 97*  CO2 28 25 27 26   GLUCOSE 103* 113* 87 133*  BUN 17 12 15 15   CREATININE 0.86 0.58 0.67 0.68  CALCIUM 8.6* 8.0* 8.3* 8.8*  MG  --  2.2 1.9  --   AST 30 30 35 60*  ALT 18 18 20  51*  ALKPHOS 77 64 56 70  BILITOT 0.9 0.4 0.6 0.7   ------------------------------------------------------------------------------------------------------------------ No results for input(s): CHOL, HDL, LDLCALC, TRIG, CHOLHDL, LDLDIRECT in the last 72 hours.  Lab Results  Component Value Date   HGBA1C 5.7 (H) 02/06/2019   ------------------------------------------------------------------------------------------------------------------ No results for input(s): TSH, T4TOTAL, T3FREE, THYROIDAB in the last 72 hours.  Invalid input(s): FREET3 ------------------------------------------------------------------------------------------------------------------ Recent Labs    02/06/19 0633 02/07/19 0326  FERRITIN 307 284    Coagulation profile No results for input(s): INR, PROTIME in the last 168 hours.  Recent Labs    02/07/19 0326  DDIMER 0.64*    Cardiac Enzymes No results for input(s): CKMB, TROPONINI, MYOGLOBIN in the last 168 hours.  Invalid input(s):  CK ------------------------------------------------------------------------------------------------------------------    Component Value Date/Time   BNP 178.8 (H) 02/06/2019 1322    Micro Results Recent Results (from the past 240 hour(s))  Blood culture (routine x 2)     Status: None (Preliminary result)   Collection Time: 02/04/19  1:30 PM   Specimen: BLOOD  Result Value Ref Range Status   Specimen Description BLOOD R AC  Final   Special Requests   Final    BOTTLES DRAWN AEROBIC AND ANAEROBIC Blood Culture results may not be optimal due to an excessive volume of blood received in culture bottles   Culture   Final    NO GROWTH 3 DAYS Performed at [facility], [address], [place], [place] [address]    Report Status PENDING  Incomplete  Respiratory Panel by RT PCR (Flu A&B, Covid) - Nasopharyngeal Swab     Status: Abnormal   Collection Time: 02/04/19  1:36 PM   Specimen: Nasopharyngeal Swab  Result Value Ref Range Status   SARS Coronavirus 2 by RT PCR POSITIVE (A) NEGATIVE Final  Comment: RESULT CALLED TO, READ BACK BY AND VERIFIED WITH: ASHLEY ORSUTO ON 02/04/2019 AT 1454 TIK (NOTE) SARS-CoV-2 target nucleic acids are DETECTED. SARS-CoV-2 RNA is generally detectable in upper respiratory specimens  during the acute phase of infection. Positive results are indicative of the presence of the identified virus, but do not rule out bacterial infection or co-infection with other pathogens not detected by the test. Clinical correlation with patient history and other diagnostic information is necessary to determine patient infection status. The expected result is Negative. Fact Sheet for Patients:  PinkCheek.be Fact Sheet for Healthcare Providers: GravelBags.it This test is not yet approved or cleared by the [place] FDA and  has been authorized for detection and/or diagnosis of SARS-CoV-2 by FDA under an  Emergency Use Authorization (EUA).  This EUA will remain in effect (meaning this test can be use d) for the duration of  the COVID-19 declaration under Section 564(b)(1) of the Act, 21 U.S.C. section 360bbb-3(b)(1), unless the authorization is terminated or revoked sooner.    Influenza A by PCR NEGATIVE NEGATIVE Final   Influenza B by PCR NEGATIVE NEGATIVE Final    Comment: (NOTE) The Xpert Xpress SARS-CoV-2/FLU/RSV assay is intended as an aid in  the diagnosis of influenza from Nasopharyngeal swab specimens and  should not be used as a sole basis for treatment. Nasal washings and  aspirates are unacceptable for Xpert Xpress SARS-CoV-2/FLU/RSV  testing. Fact Sheet for Patients: PinkCheek.be Fact Sheet for Healthcare Providers: GravelBags.it This test is not yet approved or cleared by the [place] FDA and  has been authorized for detection and/or diagnosis of SARS-CoV-2 by  FDA under an Emergency Use Authorization (EUA). This EUA will remain  in effect (meaning this test can be used) for the duration of the  Covid-19 declaration under Section 564(b)(1) of the Act, 21  U.S.C. section 360bbb-3(b)(1), unless the authorization is  terminated or revoked. Performed at [facility], [address] [place], [place] [address]   Blood culture (routine x 2)     Status: None (Preliminary result)   Collection Time: 02/04/19  2:51 PM   Specimen: BLOOD  Result Value Ref Range Status   Specimen Description BLOOD BLOOD RIGHT HAND  Final   Special Requests   Final    BOTTLES DRAWN AEROBIC AND ANAEROBIC Blood Culture results may not be optimal due to an inadequate volume of blood received in culture bottles   Culture   Final    NO GROWTH 3 DAYS Performed at [facility], [address], [place], [place] [address]    Report Status PENDING  Incomplete    Radiology Reports DG Chest Portable 1 View  Result Date:  02/04/2019 CLINICAL DATA:  Shortness of breath. EXAM: PORTABLE CHEST 1 VIEW COMPARISON:  Chest x-ray dated 01/15/2008. FINDINGS: Heart size and mediastinal contours are within normal limits. Patchy peripheral airspace opacities, LEFT slightly greater than RIGHT. No pleural effusion or pneumothorax is seen. Osseous structures about the chest are unremarkable. IMPRESSION: Patchy peripheral airspace opacities, LEFT slightly greater than RIGHT, compatible with multifocal pneumonia versus pulmonary edema. Electronically Signed   By: Franki Cabot M.D.   On: 02/04/2019 13:16

## 2019-02-07 NOTE — Plan of Care (Signed)
   Vital Signs MEWS/VS Documentation       02/07/2019 0905 02/07/2019 1257 02/07/2019 1651 02/07/2019 2015   MEWS Score:  --  0  0  0   MEWS Score Color:  --  Leslie Koch  Green   Resp:  --  --  --  20   Pulse:  --  --  --  76   BP:  --  112/68  116/65  115/70   Temp:  --  98.4 F (36.9 C)  99 F (37.2 C)  98 F (36.7 C)   O2 Device:  HFNC  HFNC  HFNC  --   O2 Flow Rate (L/min):  10 L/min  7 L/min  7 L/min  --   Level of Consciousness:  --  Alert  Alert  --         POC reviewed with pt. Pt up in chair   [employer] [name] 02/07/2019,9:27 PM

## 2019-02-07 NOTE — Progress Notes (Signed)
Occupational Therapy Evaluation Patient Details Name: Leslie Koch MRN: [id number] DOB: 05/30/1955 Today's Date: 02/07/2019    History of Present Illness Covid/PNA ; PMH includes HN and dyslipidemia- patient also indicates she has had knee replacements with one revision.   Clinical Impression   Patient very kind and willing to participate.  She lives with husband and was independent prior.  Patient on 8L O2 throughout session and SpO2 90 at rest.  She was able to complete transfers with supervision and functional mobility with min guard and rolling walker.  SpO2 dropped to 87 with mobility and she was winded with 60 ft walk.  Recovered back to low 90s with rest and pursed lip breathing.  She needed mod assist with LE dressing due to difficulty with breathing when bending over.  Practiced flutter valve and incentive spirometer.  Will continue with OT acutely to address listed deficits.    Follow Up Recommendations  Home health OT    Equipment Recommendations  Tub/shower seat    Recommendations for Other Services       Precautions / Restrictions Precautions Precautions: Fall Restrictions Weight Bearing Restrictions: No      Mobility Bed Mobility Overal bed mobility: Modified Independent             General bed mobility comments: Has tried but can't tolerate prone position in bed  Transfers Overall transfer level: Needs assistance Equipment used: Rolling walker (2 wheeled) Transfers: Sit to/from [employer] Transfers Sit to Stand: Supervision Stand pivot transfers: Supervision            Balance Overall balance assessment: Mild deficits observed, not formally tested                                         ADL either performed or assessed with clinical judgement   ADL Overall ADL's : Needs assistance/impaired Eating/Feeding: Independent;Sitting   Grooming: Min guard;Standing   Upper Body Bathing: Supervision/ safety;Sitting   Lower  Body Bathing: Mod Assit;Sitting/lateral leans   Upper Body Dressing : Sitting;Supervision/safety   Lower Body Dressing: Sit to/from stand; Mod Assist   Toilet Transfer: Supervision/safety;BSC;Ambulation   Toileting- Water quality scientist and Hygiene: Audiological scientist (clothing);Sitting/lateral lean       Functional mobility during ADLs: Min guard;Rolling walker       Vision Baseline Vision/History: No visual deficits       Perception     Praxis      Pertinent Vitals/Pain       Hand Dominance     Extremity/Trunk Assessment Upper Extremity Assessment Upper Extremity Assessment: Generalized weakness           Communication Communication Communication: No difficulties   Cognition Arousal/Alertness: Awake/alert Behavior During Therapy: WFL for tasks assessed/performed Overall Cognitive Status: Within Functional Limits for tasks assessed                                     General Comments       Exercises Exercises: Other exercises Other Exercises Other Exercises: Pursed lip breathing Other Exercises: x10 flutter valve Other Exercises: x10 incentive spirometer   Shoulder Instructions      Home Living Family/patient expects to be discharged to:: Private residence Living Arrangements: Spouse/significant other Available Help at Discharge: Family Type of Home: House Home Access: Stairs to enter [employer] of  Steps: 3 with handrails Entrance Stairs-Rails: Left;Right;Can reach both Home Layout: One level     Bathroom Shower/Tub: Teacher, early years/pre: Standard Bathroom Accessibility: Yes How Accessible: Accessible via walker Home Equipment: [place] - 2 wheels;Adaptive equipment Adaptive Equipment: Reacher;Sock aid;Long-handled shoe horn        Prior Functioning/Environment Level of Independence: Independent                 OT Problem List: Decreased strength;Decreased activity tolerance;Impaired balance  (sitting and/or standing);Cardiopulmonary status limiting activity      OT Treatment/Interventions: Self-care/ADL training;Therapeutic exercise;Energy conservation;Therapeutic activities;Patient/family education;Balance training    OT Goals(Current goals can be found in the care plan section) Acute Rehab OT Goals Patient Stated Goal: Get home ot husband OT Goal Formulation: With patient Time For Goal Achievement: 02/21/19 Potential to Achieve Goals: Good  OT Frequency: Min 3X/week   Barriers to D/C:            Co-evaluation              AM-PAC OT "6 Clicks" Daily Activity     Outcome Measure Help from another person eating meals?: None Help from another person taking care of personal grooming?: A Little Help from another person toileting, which includes using toliet, bedpan, or urinal?: A Little Help from another person bathing (including washing, rinsing, drying)?: A Little Help from another person to put on and taking off regular upper body clothing?: A Little Help from another person to put on and taking off regular lower body clothing?: A Lot 6 Click Score: 18   End of Session Equipment Utilized During Treatment: Gait belt;Rolling walker;Oxygen Nurse Communication: Mobility status  Activity Tolerance: Patient limited by fatigue Patient left: in chair;with chair alarm set  OT Visit Diagnosis: Unsteadiness on feet (R26.81);Muscle weakness (generalized) (M62.81);Other (comment)(Cardiopulminary limitations)                Time: 1421-1510 OT Time Calculation (min): 49 min Charges:  OT General Charges $OT Visit: 1 Visit OT Evaluation $OT Eval Moderate Complexity: 1 Mod OT Treatments $Self Care/Home Management : 23-37 mins  August Luz, OTR/L  Phylliss Bob 02/07/2019, 4:12 PM

## 2019-02-08 LAB — CBC WITH DIFFERENTIAL/PLATELET
Abs Immature Granulocytes: 0.14 10*3/uL — ABNORMAL HIGH (ref 0.00–0.07)
Basophils Absolute: 0 10*3/uL (ref 0.0–0.1)
Basophils Relative: 1 %
Eosinophils Absolute: 0 10*3/uL (ref 0.0–0.5)
Eosinophils Relative: 0 %
HCT: 40.8 % (ref 36.0–46.0)
Hemoglobin: 13.6 g/dL (ref 12.0–15.0)
Immature Granulocytes: 2 %
Lymphocytes Relative: 17 %
Lymphs Abs: 1.1 10*3/uL (ref 0.7–4.0)
MCH: 31.3 pg (ref 26.0–34.0)
MCHC: 33.3 g/dL (ref 30.0–36.0)
MCV: 93.8 fL (ref 80.0–100.0)
Monocytes Absolute: 0.3 10*3/uL (ref 0.1–1.0)
Monocytes Relative: 5 %
Neutro Abs: 4.9 10*3/uL (ref 1.7–7.7)
Neutrophils Relative %: 75 %
Platelets: 575 10*3/uL — ABNORMAL HIGH (ref 150–400)
RBC: 4.35 MIL/uL (ref 3.87–5.11)
RDW: 12.5 % (ref 11.5–15.5)
WBC: 6.5 10*3/uL (ref 4.0–10.5)
nRBC: 0 % (ref 0.0–0.2)

## 2019-02-08 LAB — COMPREHENSIVE METABOLIC PANEL
ALT: 46 U/L — ABNORMAL HIGH (ref 0–44)
AST: 40 U/L (ref 15–41)
Albumin: 3.2 g/dL — ABNORMAL LOW (ref 3.5–5.0)
Alkaline Phosphatase: 65 U/L (ref 38–126)
Anion gap: 12 (ref 5–15)
BUN: 21 mg/dL (ref 8–23)
CO2: 28 mmol/L (ref 22–32)
Calcium: 8.7 mg/dL — ABNORMAL LOW (ref 8.9–10.3)
Chloride: 96 mmol/L — ABNORMAL LOW (ref 98–111)
Creatinine, Ser: 0.75 mg/dL (ref 0.44–1.00)
GFR calc Af Amer: >60 mL/min (ref >60)
GFR calc non Af Amer: >60 mL/min (ref >60)
Glucose, Bld: 162 mg/dL — ABNORMAL HIGH (ref 70–99)
Potassium: 3.8 mmol/L (ref 3.5–5.1)
Sodium: 136 mmol/L (ref 135–145)
Total Bilirubin: 0.6 mg/dL (ref 0.3–1.2)
Total Protein: 7.1 g/dL (ref 6.5–8.1)

## 2019-02-08 LAB — GLUCOSE, CAPILLARY
Glucose-Capillary: 119 mg/dL — ABNORMAL HIGH (ref 70–99)
Glucose-Capillary: 83 mg/dL (ref 70–99)
Glucose-Capillary: 159 mg/dL — ABNORMAL HIGH (ref 70–99)
Glucose-Capillary: 105 mg/dL — ABNORMAL HIGH (ref 70–99)

## 2019-02-08 LAB — FERRITIN: Ferritin: 232 ng/mL (ref 11–307)

## 2019-02-08 LAB — D-DIMER, QUANTITATIVE: D-Dimer, Quant: 0.42 ug/mL-FEU (ref 0.00–0.50)

## 2019-02-08 LAB — C-REACTIVE PROTEIN: CRP: 2.8 mg/dL — ABNORMAL HIGH (ref <1.0)

## 2019-02-08 MED ORDER — SALINE SPRAY 0.65 % NA SOLN
1.0000 | NASAL | Status: DC | PRN
  Administered 2019-02-08: 1 via NASAL
  Filled 2019-02-08: qty 44

## 2019-02-08 MED ORDER — PANTOPRAZOLE SODIUM 40 MG PO TBEC
40.0000 mg | DELAYED_RELEASE_TABLET | Freq: Once | ORAL | Status: AC
  Administered 2019-02-08: 01:00:00 40 mg via ORAL
  Filled 2019-02-08: qty 1

## 2019-02-08 MED ORDER — PANTOPRAZOLE SODIUM 40 MG PO TBEC
40.0000 mg | DELAYED_RELEASE_TABLET | Freq: Two times a day (BID) | ORAL | Status: DC
  Administered 2019-02-08 – 2019-02-11 (×7): 40 mg via ORAL
  Filled 2019-02-08 (×7): qty 1

## 2019-02-08 MED ORDER — FUROSEMIDE 10 MG/ML IJ SOLN
40.0000 mg | Freq: Once | INTRAMUSCULAR | Status: AC
  Administered 2019-02-08: 12:00:00 40 mg via INTRAVENOUS
  Filled 2019-02-08: qty 4

## 2019-02-08 MED ORDER — ALUM & MAG HYDROXIDE-SIMETH 200-200-20 MG/5ML PO SUSP: 30.0000 mL | ORAL | Status: DC | PRN

## 2019-02-08 MED ORDER — FUROSEMIDE 10 MG/ML IJ SOLN
40.0000 mg | Freq: Once | INTRAMUSCULAR | Status: DC
  Filled 2019-02-08: qty 4

## 2019-02-08 MED ORDER — MAGIC MOUTHWASH
5.0000 mL | Freq: Four times a day (QID) | ORAL | Status: DC | PRN
  Administered 2019-02-08 – 2019-02-10 (×4): 5 mL via ORAL
  Filled 2019-02-08 (×6): qty 5

## 2019-02-08 MED ORDER — FLUCONAZOLE 100 MG PO TABS
100.0000 mg | ORAL_TABLET | Freq: Every day | ORAL | Status: DC
  Administered 2019-02-08 – 2019-02-11 (×4): 100 mg via ORAL
  Filled 2019-02-08 (×4): qty 1

## 2019-02-08 NOTE — Plan of Care (Signed)
  Problem: Education: Goal: Knowledge of risk factors and measures for prevention of condition will improve Outcome: Progressing   

## 2019-02-08 NOTE — Plan of Care (Signed)
  Problem: Education: Goal: Knowledge of risk factors and measures for prevention of condition will improve 02/08/2019 1747 by Levie Heritage, RN Outcome: Progressing 02/08/2019 1208 by Levie Heritage, RN Outcome: Progressing

## 2019-02-08 NOTE — Progress Notes (Signed)
PROGRESS NOTE                                                                                                                                                                                                             Patient Demographics:    Leslie Koch, is a 64 y.o. female, DOB - 05/23/1955, [id number]  Outpatient Primary MD for the patient is Rubye Beach   Admit date - 02/06/2019   LOS - 2     Brief narrative: 64 year old female with HTN, GERD, dyslipidemia, obesity-who was exposed to COVID-19 at her workplace-s/p first dose of Covid 19 vaccine on 1/11-presented to the hospital with 2-day history of fever, chills, malaise-was COVID-19 positive-and subsequently found to have acute hypoxic respiratory failure secondary to pneumonia.  She was admitted over at [facility] developed worsening hypoxemia-and was transferred to [facility] for further management and evaluation.  Significant events: 1/11: First dose of COVID-19 vaccine 1/12: Reported negative Covid test as outpatient 1/19: Reported negative Covid test as outpatient 1/24: Covid positive-and admitted to [facility] for hypoxemia due to COVID-19 pneumonia 1/26: Transferred to [facility] for worsening hypoxemia on 10 L of oxygen via HFNC  COVID-19 medications: Steroids: 1/24>> Remdesivir: 1/24>> Actemra: 1/25 x 1, 1/26 x1 Plasma: 1/26 x1   Subjective:    Linna Holmon continues to feel better-Down to 5 L of oxygen.   Assessment  & Plan :   Acute Hypoxic Resp Failure due to Covid 19 Viral pneumonia: Improved-down to 5 L-continue steroids and remdesivir.  Continue supportive care and attempts to slowly titrate down FiO2.  Fever: afebrile  O2 requirements:  SpO2: 91 % O2 Flow Rate (L/min): 5 L/min   COVID-19 Labs: Recent Labs    02/06/19 0633 02/06/19 0633 02/06/19 1322 02/07/19 0326 02/08/19 0420  DDIMER  --   --   --  0.64* 0.42  FERRITIN 307  --   --   284 232  CRP 8.3*   < > 7.9* 5.1* 2.8*   < > = values in this interval not displayed.       Component Value Date/Time   BNP 178.8 (H) 02/06/2019 1322    Recent Labs  Lab 02/04/19 1452  PROCALCITON <0.10    Lab Results  Component Value Date   SARSCOV2NAA POSITIVE (A) 02/04/2019     Prone/Incentive Spirometry: encouraged patient  to lie prone for 3-4 hours at a time for a total of 16 hours a day, and to encourage incentive spirometry use 3-4/hour.  DVT Prophylaxis  :  Lovenox-intermediate dosing  HTN: Controlled-continue lisinopril  Dyslipidemia: Continue statin  Obesity: Estimated body mass index is 37.91 kg/m as calculated from the following:   Height as of this encounter: 5\' 1"  (1.549 m).   Weight as of this encounter: 91 kg.   Consults  :  None  Procedures  :  None  ABG: No results found for: PHART, PCO2ART, PO2ART, HCO3, TCO2, ACIDBASEDEF, O2SAT  Vent Settings: N/A  Condition - Extremely Guarded  Family Communication  :  Spouse updated over the phone 1/28  Code Status :  Full Code  Diet :  Diet Order            Diet Heart Room service appropriate? Yes; Fluid consistency: Thin  Diet effective now               Disposition Plan  :  Remain hospitalized-disposition will depend on clinical course.  Barriers to discharge: Hypoxia requiring O2 supplementation/complete 5 days of IV Remdesivir  Antimicorbials  :    Anti-infectives (From admission, onward)   Start     Dose/Rate Route Frequency Ordered Stop   02/07/19 1000  remdesivir 100 mg in sodium chloride 0.9 % 100 mL IVPB  Status:  Discontinued     100 mg 200 mL/hr over 30 Minutes Intravenous Daily 02/06/19 1151 02/06/19 1159   02/07/19 1000  remdesivir 100 mg in sodium chloride 0.9 % 100 mL IVPB     100 mg 200 mL/hr over 30 Minutes Intravenous Daily 02/06/19 1201 02/08/19 1236   02/07/19 1000  remdesivir 100 mg in sodium chloride 0.9 % 100 mL IVPB  Status:  Discontinued     100 mg 200 mL/hr  over 30 Minutes Intravenous Daily 02/06/19 1239 02/06/19 1239   02/06/19 1200  remdesivir 200 mg in sodium chloride 0.9% 250 mL IVPB  Status:  Discontinued     200 mg 580 mL/hr over 30 Minutes Intravenous Once 02/06/19 1151 02/06/19 1159      Inpatient Medications  Scheduled Meds: . vitamin C  500 mg Oral Daily  . enoxaparin (LOVENOX) injection  40 mg Subcutaneous Q12H  . insulin aspart  0-9 Units Subcutaneous TID WC  . Ipratropium-Albuterol  1 puff Inhalation Q6H  . lisinopril  20 mg Oral Daily  . loratadine  10 mg Oral Daily  . methylPREDNISolone (SOLU-MEDROL) injection  0.5 mg/kg Intravenous Q12H  . pantoprazole  40 mg Oral BID  . simvastatin  40 mg Oral QHS  . sodium chloride flush  3 mL Intravenous Q12H  . zinc sulfate  220 mg Oral Daily   Continuous Infusions: . sodium chloride     PRN Meds:.sodium chloride, acetaminophen, alum & mag hydroxide-simeth, chlorpheniramine-HYDROcodone, guaiFENesin-dextromethorphan, ondansetron **OR** ondansetron (ZOFRAN) IV, polyethylene glycol, senna, sodium chloride flush    Time Spent in minutes  35   Oren Binet M.D on 02/08/2019 at 3:46 PM  To page go to [email or web address] - use universal password  [facility] -  Office  [phone]    Objective:   Vitals:   02/08/19 0010 02/08/19 0440 02/08/19 0801 02/08/19 1234  BP: 122/64 127/75 107/66 113/72  Pulse: 73 73 69 84  Resp: 20 13 20 18   Temp: 98.1 F (36.7 C) 99.2 F (37.3 C) (!) 97.5 F (36.4 C) 97.7 F (36.5 C)  TempSrc: Oral Oral Oral Oral  SpO2: 96% 93% 92% 91%  Weight:      Height:        Wt Readings from Last 3 Encounters:  02/06/19 91 kg  02/04/19 90.7 kg  02/21/17 98.4 kg     Intake/Output Summary (Last 24 hours) at 02/08/2019 1546 Last data filed at 02/08/2019 0510 Gross per 24 hour  Intake 960 ml  Output 500 ml  Net 460 ml     Physical Exam Gen Exam:Alert awake-not in any distress HEENT:atraumatic, normocephalic Chest: B/L clear to  auscultation anteriorly CVS:S1S2 regular Abdomen:soft non tender, non distended Extremities:no edema Neurology: Non focal Skin: no rash   Data Review:    CBC Recent Labs  Lab 02/04/19 1333 02/05/19 0436 02/06/19 0633 02/07/19 0326 02/08/19 0420  WBC 8.1 3.4* 5.8 5.6 6.5  HGB 13.8 11.7* 12.3 13.1 13.6  HCT 41.5 34.0* 36.7 37.8 40.8  PLT 307 293 403* 499* 575*  MCV 92.6 91.6 92.0 92.0 93.8  MCH 30.8 31.5 30.8 31.9 31.3  MCHC 33.3 34.4 33.5 34.7 33.3  RDW 12.7 12.4 12.3 12.4 12.5  LYMPHSABS 1.0 0.8 1.5 0.9 1.1  MONOABS 0.5 0.2 0.5 0.3 0.3  EOSABS 0.0 0.0 0.0 0.0 0.0  BASOSABS 0.0 0.0 0.0 0.0 0.0    Chemistries  Recent Labs  Lab 02/04/19 1333 02/05/19 0436 02/06/19 0633 02/07/19 0326 02/08/19 0420  NA 134* 139 139 139 136  K 4.5 4.3 3.6 3.8 3.8  CL 95* 106 102 97* 96*  CO2 28 25 27 26 28   GLUCOSE 103* 113* 87 133* 162*  BUN 17 12 15 15 21   CREATININE 0.86 0.58 0.67 0.68 0.75  CALCIUM 8.6* 8.0* 8.3* 8.8* 8.7*  MG  --  2.2 1.9  --   --   AST 30 30 35 60* 40  ALT 18 18 20  51* 46*  ALKPHOS 77 64 56 70 65  BILITOT 0.9 0.4 0.6 0.7 0.6   ------------------------------------------------------------------------------------------------------------------ No results for input(s): CHOL, HDL, LDLCALC, TRIG, CHOLHDL, LDLDIRECT in the last 72 hours.  Lab Results  Component Value Date   HGBA1C 5.7 (H) 02/06/2019   ------------------------------------------------------------------------------------------------------------------ No results for input(s): TSH, T4TOTAL, T3FREE, THYROIDAB in the last 72 hours.  Invalid input(s): FREET3 ------------------------------------------------------------------------------------------------------------------ Recent Labs    02/07/19 0326 02/08/19 0420  FERRITIN 284 232    Coagulation profile No results for input(s): INR, PROTIME in the last 168 hours.  Recent Labs    02/07/19 0326 02/08/19 0420  DDIMER 0.64* 0.42     Cardiac Enzymes No results for input(s): CKMB, TROPONINI, MYOGLOBIN in the last 168 hours.  Invalid input(s): CK ------------------------------------------------------------------------------------------------------------------    Component Value Date/Time   BNP 178.8 (H) 02/06/2019 1322    Micro Results Recent Results (from the past 240 hour(s))  Blood culture (routine x 2)     Status: None (Preliminary result)   Collection Time: 02/04/19  1:30 PM   Specimen: BLOOD  Result Value Ref Range Status   Specimen Description BLOOD R AC  Final   Special Requests   Final    BOTTLES DRAWN AEROBIC AND ANAEROBIC Blood Culture results may not be optimal due to an excessive volume of blood received in culture bottles   Culture   Final    NO GROWTH 4 DAYS Performed at [facility], [address] [place], [place] [address]    Report Status PENDING  Incomplete  Respiratory Panel by RT PCR (Flu A&B, Covid) - Nasopharyngeal Swab     Status: Abnormal  Collection Time: 02/04/19  1:36 PM   Specimen: Nasopharyngeal Swab  Result Value Ref Range Status   SARS Coronavirus 2 by RT PCR POSITIVE (A) NEGATIVE Final    Comment: RESULT CALLED TO, READ BACK BY AND VERIFIED WITH: ASHLEY ORSUTO ON 02/04/2019 AT 1454 TIK (NOTE) SARS-CoV-2 target nucleic acids are DETECTED. SARS-CoV-2 RNA is generally detectable in upper respiratory specimens  during the acute phase of infection. Positive results are indicative of the presence of the identified virus, but do not rule out bacterial infection or co-infection with other pathogens not detected by the test. Clinical correlation with patient history and other diagnostic information is necessary to determine patient infection status. The expected result is Negative. Fact Sheet for Patients:  PinkCheek.be Fact Sheet for Healthcare Providers: GravelBags.it This test is not yet approved or  cleared by the [place] FDA and  has been authorized for detection and/or diagnosis of SARS-CoV-2 by FDA under an Emergency Use Authorization (EUA).  This EUA will remain in effect (meaning this test can be use d) for the duration of  the COVID-19 declaration under Section 564(b)(1) of the Act, 21 U.S.C. section 360bbb-3(b)(1), unless the authorization is terminated or revoked sooner.    Influenza A by PCR NEGATIVE NEGATIVE Final   Influenza B by PCR NEGATIVE NEGATIVE Final    Comment: (NOTE) The Xpert Xpress SARS-CoV-2/FLU/RSV assay is intended as an aid in  the diagnosis of influenza from Nasopharyngeal swab specimens and  should not be used as a sole basis for treatment. Nasal washings and  aspirates are unacceptable for Xpert Xpress SARS-CoV-2/FLU/RSV  testing. Fact Sheet for Patients: PinkCheek.be Fact Sheet for Healthcare Providers: GravelBags.it This test is not yet approved or cleared by the [place] FDA and  has been authorized for detection and/or diagnosis of SARS-CoV-2 by  FDA under an Emergency Use Authorization (EUA). This EUA will remain  in effect (meaning this test can be used) for the duration of the  Covid-19 declaration under Section 564(b)(1) of the Act, 21  U.S.C. section 360bbb-3(b)(1), unless the authorization is  terminated or revoked. Performed at [facility], [address] [place], [place] [address]   Blood culture (routine x 2)     Status: None (Preliminary result)   Collection Time: 02/04/19  2:51 PM   Specimen: BLOOD  Result Value Ref Range Status   Specimen Description BLOOD BLOOD RIGHT HAND  Final   Special Requests   Final    BOTTLES DRAWN AEROBIC AND ANAEROBIC Blood Culture results may not be optimal due to an inadequate volume of blood received in culture bottles   Culture   Final    NO GROWTH 4 DAYS Performed at [facility], [address],  [place], [place] [address]    Report Status PENDING  Incomplete    Radiology Reports DG Chest Portable 1 View  Result Date: 02/04/2019 CLINICAL DATA:  Shortness of breath. EXAM: PORTABLE CHEST 1 VIEW COMPARISON:  Chest x-ray dated 01/15/2008. FINDINGS: Heart size and mediastinal contours are within normal limits. Patchy peripheral airspace opacities, LEFT slightly greater than RIGHT. No pleural effusion or pneumothorax is seen. Osseous structures about the chest are unremarkable. IMPRESSION: Patchy peripheral airspace opacities, LEFT slightly greater than RIGHT, compatible with multifocal pneumonia versus pulmonary edema. Electronically Signed   By: Franki Cabot M.D.   On: 02/04/2019 13:16

## 2019-02-08 NOTE — Progress Notes (Signed)
Physical Therapy Treatment Patient Details Name: Leslie Koch MRN: [id number] DOB: 03/11/1955 Today's Date: 02/08/2019    History of Present Illness Pt is a 64 y.o. female admitted 02/06/19 with fever, chills and malaise; (+) COVID-19. Found to have acute hypoxic respiratory failure secondary to pneumonia. PMH includes bilateral TKAs, HTN, obesity.   PT Comments    Pt progressing well with mobility. Able to perform multiple bouts of activity and ambulation in room without DME at supervision-level. SpO2 down to 86% on 6L O2 HFNC, recovering with seated rest and pursed lip breathing. At rest, pt maintaining 88-93% on 5L HFNC. Pt motivated to participate and return home. Reviewed educ re: therex, activity recommendations, positioning, importance of mobility, IS/flutter valve use>    Follow Up Recommendations  Home health PT(vs. no PT follow-up pending progression)     Equipment Recommendations  None recommended by PT    Recommendations for Other Services       Precautions / Restrictions Precautions Precautions: Fall Restrictions Weight Bearing Restrictions: No    Mobility  Bed Mobility               General bed mobility comments: Received sitting in recliner  Transfers Overall transfer level: Needs assistance Equipment used: None Transfers: Sit to/from Stand Sit to Stand: Supervision            Ambulation/Gait Ambulation/Gait assistance: Supervision Gait Distance (Feet): 260 Feet Assistive device: None Gait Pattern/deviations: Step-through pattern;Decreased stride length;Wide base of support   Gait velocity interpretation: 1.31 - 2.62 ft/sec, indicative of limited community ambulator General Gait Details: Slow, mostly steady gait, able to walk multiple laps in room, 2x standing rest breaks and 1x seated rest break secondary to DOE. SpO2 down to 86% on 6L HFNC   Stairs             Wheelchair Mobility    Modified Rankin (Stroke Patients Only)        Balance Overall balance assessment: Needs assistance   Sitting balance-Leahy Scale: Good       Standing balance-Leahy Scale: Good                              Cognition Arousal/Alertness: Awake/alert Behavior During Therapy: WFL for tasks assessed/performed Overall Cognitive Status: Within Functional Limits for tasks assessed                                        Exercises      General Comments General comments (skin integrity, edema, etc.): Min cues for correcting technique with flutter valve (to hold longer); good technique with IS, pulling 750 mL      Pertinent Vitals/Pain Pain Assessment: No/denies pain    Home Living                      Prior Function            PT Goals (current goals can now be found in the care plan section) Acute Rehab PT Goals Patient Stated Goal: "I think it's time to retire from work" PT Goal Formulation: With patient Time For Goal Achievement: 02/22/19 Potential to Achieve Goals: Good Progress towards PT goals: Progressing toward goals    Frequency    Min 3X/week      PT Plan Current plan remains appropriate    Co-evaluation  AM-PAC PT "6 Clicks" Mobility   Outcome Measure  Help needed turning from your back to your side while in a flat bed without using bedrails?: None Help needed moving from lying on your back to sitting on the side of a flat bed without using bedrails?: None Help needed moving to and from a bed to a chair (including a wheelchair)?: None Help needed standing up from a chair using your arms (e.g., wheelchair or bedside chair)?: None Help needed to walk in hospital room?: A Little Help needed climbing 3-5 steps with a railing? : A Little 6 Click Score: 22    End of Session Equipment Utilized During Treatment: Oxygen Activity Tolerance: Patient tolerated treatment well Patient left: in chair;with call bell/phone within reach;with  nursing/sitter in room Nurse Communication: Mobility status PT Visit Diagnosis: Muscle weakness (generalized) (M62.81)     Time: [id number] PT Time Calculation (min) (ACUTE ONLY): 29 min  Charges:  $Therapeutic Exercise: 23-37 mins                     Leslie Koch, PT, DPT Acute Rehabilitation Services  Pager [phone] Office [phone]  Derry Lory 02/08/2019, 1:55 PM

## 2019-02-09 LAB — GLUCOSE, CAPILLARY
Glucose-Capillary: 174 mg/dL — ABNORMAL HIGH (ref 70–99)
Glucose-Capillary: 154 mg/dL — ABNORMAL HIGH (ref 70–99)
Glucose-Capillary: 137 mg/dL — ABNORMAL HIGH (ref 70–99)
Glucose-Capillary: 216 mg/dL — ABNORMAL HIGH (ref 70–99)

## 2019-02-09 LAB — CBC WITH DIFFERENTIAL/PLATELET
Abs Immature Granulocytes: 0.17 10*3/uL — ABNORMAL HIGH (ref 0.00–0.07)
Basophils Absolute: 0 10*3/uL (ref 0.0–0.1)
Basophils Relative: 1 %
Eosinophils Absolute: 0 10*3/uL (ref 0.0–0.5)
Eosinophils Relative: 0 %
HCT: 38.4 % (ref 36.0–46.0)
Hemoglobin: 13 g/dL (ref 12.0–15.0)
Immature Granulocytes: 3 %
Lymphocytes Relative: 16 %
Lymphs Abs: 1 10*3/uL (ref 0.7–4.0)
MCH: 31.5 pg (ref 26.0–34.0)
MCHC: 33.9 g/dL (ref 30.0–36.0)
MCV: 93 fL (ref 80.0–100.0)
Monocytes Absolute: 0.3 10*3/uL (ref 0.1–1.0)
Monocytes Relative: 5 %
Neutro Abs: 4.7 10*3/uL (ref 1.7–7.7)
Neutrophils Relative %: 75 %
Platelets: 522 10*3/uL — ABNORMAL HIGH (ref 150–400)
RBC: 4.13 MIL/uL (ref 3.87–5.11)
RDW: 12.5 % (ref 11.5–15.5)
WBC: 6.2 10*3/uL (ref 4.0–10.5)
nRBC: 0 % (ref 0.0–0.2)

## 2019-02-09 LAB — COMPREHENSIVE METABOLIC PANEL
ALT: 34 U/L (ref 0–44)
AST: 21 U/L (ref 15–41)
Albumin: 3 g/dL — ABNORMAL LOW (ref 3.5–5.0)
Alkaline Phosphatase: 59 U/L (ref 38–126)
Anion gap: 11 (ref 5–15)
BUN: 21 mg/dL (ref 8–23)
CO2: 29 mmol/L (ref 22–32)
Calcium: 8.2 mg/dL — ABNORMAL LOW (ref 8.9–10.3)
Chloride: 96 mmol/L — ABNORMAL LOW (ref 98–111)
Creatinine, Ser: 0.79 mg/dL (ref 0.44–1.00)
GFR calc Af Amer: >60 mL/min (ref >60)
GFR calc non Af Amer: >60 mL/min (ref >60)
Glucose, Bld: 156 mg/dL — ABNORMAL HIGH (ref 70–99)
Potassium: 3.7 mmol/L (ref 3.5–5.1)
Sodium: 136 mmol/L (ref 135–145)
Total Bilirubin: 0.7 mg/dL (ref 0.3–1.2)
Total Protein: 6.3 g/dL — ABNORMAL LOW (ref 6.5–8.1)

## 2019-02-09 LAB — FERRITIN: Ferritin: 162 ng/mL (ref 11–307)

## 2019-02-09 LAB — CULTURE, BLOOD (ROUTINE X 2)
Culture: NO GROWTH
Culture: NO GROWTH

## 2019-02-09 LAB — D-DIMER, QUANTITATIVE: D-Dimer, Quant: 0.5 ug/mL-FEU (ref 0.00–0.50)

## 2019-02-09 LAB — C-REACTIVE PROTEIN: CRP: 1.4 mg/dL — ABNORMAL HIGH (ref <1.0)

## 2019-02-09 MED ORDER — METHYLPREDNISOLONE SODIUM SUCC 40 MG IJ SOLR
20.0000 mg | Freq: Two times a day (BID) | INTRAMUSCULAR | Status: DC
  Administered 2019-02-09: 22:00:00 20 mg via INTRAVENOUS
  Filled 2019-02-09: qty 1

## 2019-02-09 NOTE — Plan of Care (Signed)
Plan of Care reviewed. 

## 2019-02-09 NOTE — Plan of Care (Signed)
Pt slept well during the night. No complaints of pain verbalized. Alert and oriented. Vitals stable on 2L O2, desaturates down to the 70s with activity but quick recovery noted. Minimal dyspnea on exertion and non- productive cough noted, prn Robitussin given. Minimal assist with ADLs. Standby assist to the [facility] for toileting needs. Slept on either side but unable to tolerate prone positioning overnight. No other issues, will monitor.   Problem: Education: Goal: Knowledge of risk factors and measures for prevention of condition will improve Outcome: Progressing   Problem: Coping: Goal: Psychosocial and spiritual needs will be supported Outcome: Progressing   Problem: Respiratory: Goal: Will maintain a patent airway Outcome: Progressing Goal: Complications related to the disease process, condition or treatment will be avoided or minimized Outcome: Progressing

## 2019-02-09 NOTE — Progress Notes (Signed)
Occupational Therapy Treatment Patient Details Name: Leslie Koch MRN: [id number] DOB: 1956/01/08 Today's Date: 02/09/2019    History of present illness Pt is a 64 y.o. female admitted 02/06/19 with fever, chills and malaise; (+) COVID-19. Found to have acute hypoxic respiratory failure secondary to pneumonia. PMH includes bilateral TKAs, HTN, obesity.   OT comments  Pt making excellent progress. Ambulated over 400 ft on 2L with SpO2 maintaining above 90. Pt does desat on RA. Educated on energy conservation/activity modification to increase independence and reduce risk of falls. Pt plans to purchase a shower chair on her own. Pt very appreciative.   Follow Up Recommendations  No OT follow up;Supervision - Intermittent    Equipment Recommendations  Tub/shower seat    Recommendations for Other Services      Precautions / Restrictions Precautions Precaution Comments: watch O2       Mobility Bed Mobility Overal bed mobility: Modified Independent                Transfers Overall transfer level: Modified independent                    Balance Overall balance assessment: Mild deficits observed, not formally tested                                         ADL either performed or assessed with clinical judgement   ADL                                         General ADL Comments: Educated pt on energy conservation and compensatory strategies for ADL. Recommend use of a showe chair for bathing. Pt verbalized understanding.     Vision       Perception     Praxis      Cognition Arousal/Alertness: Awake/alert Behavior During Therapy: WFL for tasks assessed/performed Overall Cognitive Status: Within Functional Limits for tasks assessed                                          Exercises Exercises: Other exercises Other Exercises Other Exercises: Pursed lip breathing Other Exercises: x10 flutter  valve Other Exercises: x10 incentive spirometer   Shoulder Instructions       General Comments      Pertinent Vitals/ Pain       Pain Assessment: No/denies pain  Home Living                                          Prior Functioning/Environment              Frequency  Min 3X/week        Progress Toward Goals  OT Goals(current goals can now be found in the care plan section)  Progress towards OT goals: Progressing toward goals  Acute Rehab OT Goals Patient Stated Goal: "I think it's time to retire from work" OT Goal Formulation: With patient Time For Goal Achievement: 02/21/19 Potential to Achieve Goals: Good ADL Goals Pt Will Perform Grooming: Independently;standing Pt Will Perform Lower Body Dressing: with modified independence;sit to/from  stand Pt Will Transfer to Toilet: Independently;regular height toilet;ambulating Additional ADL Goal #1: Patient will maintain SpO2 > 90 during ADL. Additional ADL Goal #2: Patient will initiate pursed lip breathing during ADL.  Plan Discharge plan needs to be updated    Co-evaluation                 AM-PAC OT "6 Clicks" Daily Activity     Outcome Measure   Help from another person eating meals?: None Help from another person taking care of personal grooming?: A Little Help from another person toileting, which includes using toliet, bedpan, or urinal?: A Little Help from another person bathing (including washing, rinsing, drying)?: A Little Help from another person to put on and taking off regular upper body clothing?: A Little Help from another person to put on and taking off regular lower body clothing?: A Little 6 Click Score: 19    End of Session Equipment Utilized During Treatment: Oxygen(2L)  OT Visit Diagnosis: Unsteadiness on feet (R26.81);Muscle weakness (generalized) (M62.81);Other (comment)   Activity Tolerance Patient tolerated treatment well   Patient Left in bed;with call  bell/phone within reach   Nurse Communication Mobility status;Other (comment)(O2 needs)        Time: 1600-1630 OT Time Calculation (min): 30 min  Charges: OT General Charges $OT Visit: 1 Visit OT Treatments $Self Care/Home Management : 23-37 mins  Maurie Boettcher, OT/L   Acute OT Clinical Specialist [place] Pager [phone] Office [phone]    [facility] 02/09/2019, 5:46 PM

## 2019-02-09 NOTE — Progress Notes (Signed)
SATURATION QUALIFICATIONS: (This note is used to comply with regulatory documentation for home oxygen)  Patient Saturations on Room Air at Rest = 87%  Patient Saturations on Room Air while Ambulating = 84%  Patient Saturations on 2 Liters of oxygen while Ambulating = 93%  Please briefly explain why patient needs home oxygen: Pt requires 2L O2 to maintain SpO2 above 90 during ADL and mobility.   Maurie Boettcher, OT/L   Acute OT Clinical Specialist Acute Rehabilitation Services Pager [phone] Office [phone]

## 2019-02-09 NOTE — Progress Notes (Addendum)
PROGRESS NOTE                                                                                                                                                                                                             Patient Demographics:    Leslie Koch, is a 64 y.o. female, DOB - 04/25/1955, [id number]  Outpatient Primary MD for the patient is Rubye Beach   Admit date - 02/06/2019   LOS - 3     Brief narrative: 64 year old female with HTN, GERD, dyslipidemia, obesity-who was exposed to COVID-19 at her workplace-s/p first dose of Covid 19 vaccine on 1/11-presented to the hospital with 2-day history of fever, chills, malaise-was COVID-19 positive-and subsequently found to have acute hypoxic respiratory failure secondary to pneumonia.  She was admitted over at [facility] developed worsening hypoxemia-and was transferred to [facility] for further management and evaluation.  Significant events: 1/11: First dose of COVID-19 vaccine 1/12: Reported negative Covid test as outpatient 1/19: Reported negative Covid test as outpatient 1/24: Covid positive-and admitted to [facility] for hypoxemia due to COVID-19 pneumonia 1/26: Transferred to [facility] for worsening hypoxemia on 10 L of oxygen via [facility]  COVID-19 medications: Steroids: 1/24>> Remdesivir: 1/24>> 1/28 Actemra: 1/25 x 1, 1/26 x1 Plasma: 1/26 x1   Subjective:   Looks much better-she feels better as well-she was titrated down to 2 L this morning-was on 5 L yesterday.  Only issue is exertional dyspnea with ambulation-but that also seems to be better than the past few days.   Assessment  & Plan :   Acute Hypoxic Resp Failure due to Covid 19 Viral pneumonia: Slowly improving-she was titrated down from 5 L to 2 L this morning.  Still complains of shortness of breath with ambulation-suspect that we will slowly get better with time-we will likely require home O2-see PT note  on 1/28.  She completed remdesivir on 1/28-continue IV steroids (but taper)-we will monitor for the next day or so-if clinical improvement continues-suspect she may be ready to go home in the next day or so on home O2/home health services (both ordered).  Continue supportive care  Fever: afebrile  O2 requirements:  SpO2: 91 % O2 Flow Rate (L/min): 2 L/min   COVID-19 Labs: Recent Labs    02/07/19 0326 02/08/19 0420 02/09/19 0329  DDIMER 0.64* 0.42 0.50  FERRITIN  284 232 162  CRP 5.1* 2.8* 1.4*       Component Value Date/Time   BNP 178.8 (H) 02/06/2019 1322    Recent Labs  Lab 02/04/19 1452  PROCALCITON <0.10    Lab Results  Component Value Date   SARSCOV2NAA POSITIVE (A) 02/04/2019     Prone/Incentive Spirometry: encouraged  incentive spirometry use 3-4/hour.  DVT Prophylaxis  :  Lovenox-intermediate dosing  Oral candidiasis: Continue Diflucan-improved.  Antifungal agents Diflucan: 1/28 x 5 days  HTN: Controlled-continue lisinopril  Dyslipidemia: Continue statin  Obesity: Estimated body mass index is 37.91 kg/m as calculated from the following:   Height as of this encounter: 5\' 1"  (1.549 m).   Weight as of this encounter: 91 kg.   Consults  :  None  Procedures  :  None  ABG: No results found for: PHART, PCO2ART, PO2ART, HCO3, TCO2, ACIDBASEDEF, O2SAT  Vent Settings: N/A  Condition - Guarded  Family Communication  :  Spouse updated over the phone 1/28  Code Status :  Full Code  Diet :  Diet Order            Diet Heart Room service appropriate? Yes; Fluid consistency: Thin  Diet effective now               Disposition Plan  :  Remain hospitalized-slowly improving hypoxemia-Home with home health services in the next day or so if clinical improvement continues.  Will require home O2.  Barriers to discharge: Hypoxemia requiring O2 supplementation-shortness of breath with ambulation.  Antimicorbials  :    Anti-infectives (From admission,  onward)   Start     Dose/Rate Route Frequency Ordered Stop   02/08/19 1600  fluconazole (DIFLUCAN) tablet 100 mg     100 mg Oral Daily 02/08/19 1558 02/13/19 0959   02/07/19 1000  remdesivir 100 mg in sodium chloride 0.9 % 100 mL IVPB  Status:  Discontinued     100 mg 200 mL/hr over 30 Minutes Intravenous Daily 02/06/19 1151 02/06/19 1159   02/07/19 1000  remdesivir 100 mg in sodium chloride 0.9 % 100 mL IVPB     100 mg 200 mL/hr over 30 Minutes Intravenous Daily 02/06/19 1201 02/08/19 1236   02/07/19 1000  remdesivir 100 mg in sodium chloride 0.9 % 100 mL IVPB  Status:  Discontinued     100 mg 200 mL/hr over 30 Minutes Intravenous Daily 02/06/19 1239 02/06/19 1239   02/06/19 1200  remdesivir 200 mg in sodium chloride 0.9% 250 mL IVPB  Status:  Discontinued     200 mg 580 mL/hr over 30 Minutes Intravenous Once 02/06/19 1151 02/06/19 1159      Inpatient Medications  Scheduled Meds: . vitamin C  500 mg Oral Daily  . enoxaparin (LOVENOX) injection  40 mg Subcutaneous Q12H  . fluconazole  100 mg Oral Daily  . insulin aspart  0-9 Units Subcutaneous TID WC  . Ipratropium-Albuterol  1 puff Inhalation Q6H  . lisinopril  20 mg Oral Daily  . loratadine  10 mg Oral Daily  . methylPREDNISolone (SOLU-MEDROL) injection  0.5 mg/kg Intravenous Q12H  . pantoprazole  40 mg Oral BID  . simvastatin  40 mg Oral QHS  . sodium chloride flush  3 mL Intravenous Q12H  . zinc sulfate  220 mg Oral Daily   Continuous Infusions: . sodium chloride     PRN Meds:.sodium chloride, acetaminophen, alum & mag hydroxide-simeth, chlorpheniramine-HYDROcodone, guaiFENesin-dextromethorphan, magic mouthwash, ondansetron **OR** ondansetron (ZOFRAN) IV, polyethylene glycol, senna, sodium chloride,  sodium chloride flush    Time Spent in minutes  25  Oren Binet M.D on 02/09/2019 at 3:20 PM  To page go to [email or web address] - use universal password  [facility] -  Office  [phone]    Objective:    Vitals:   02/09/19 0415 02/09/19 0744 02/09/19 0806 02/09/19 1154  BP: (!) 133/93 113/66  (!) 105/57  Pulse: 82 75  75  Resp: 18 19  20   Temp: 98 F (36.7 C)  (!) 97.5 F (36.4 C) 98.4 F (36.9 C)  TempSrc: Oral  Oral Oral  SpO2: 92% 91%  91%  Weight:      Height:        Wt Readings from Last 3 Encounters:  02/06/19 91 kg  02/04/19 90.7 kg  02/21/17 98.4 kg     Intake/Output Summary (Last 24 hours) at 02/09/2019 1520 Last data filed at 02/09/2019 0935 Gross per 24 hour  Intake 963 ml  Output 900 ml  Net 63 ml     Physical Exam Gen Exam:Alert awake-not in any distress HEENT:atraumatic, normocephalic Chest: B/L clear to auscultation anteriorly CVS:S1S2 regular Abdomen:soft non tender, non distended Extremities:no edema Neurology: Non focal Skin: no rash   Data Review:    CBC Recent Labs  Lab 02/05/19 0436 02/06/19 0633 02/07/19 0326 02/08/19 0420 02/09/19 0329  WBC 3.4* 5.8 5.6 6.5 6.2  HGB 11.7* 12.3 13.1 13.6 13.0  HCT 34.0* 36.7 37.8 40.8 38.4  PLT 293 403* 499* 575* 522*  MCV 91.6 92.0 92.0 93.8 93.0  MCH 31.5 30.8 31.9 31.3 31.5  MCHC 34.4 33.5 34.7 33.3 33.9  RDW 12.4 12.3 12.4 12.5 12.5  LYMPHSABS 0.8 1.5 0.9 1.1 1.0  MONOABS 0.2 0.5 0.3 0.3 0.3  EOSABS 0.0 0.0 0.0 0.0 0.0  BASOSABS 0.0 0.0 0.0 0.0 0.0    Chemistries  Recent Labs  Lab 02/05/19 0436 02/06/19 0633 02/07/19 0326 02/08/19 0420 02/09/19 0329  NA 139 139 139 136 136  K 4.3 3.6 3.8 3.8 3.7  CL 106 102 97* 96* 96*  CO2 25 27 26 28 29   GLUCOSE 113* 87 133* 162* 156*  BUN 12 15 15 21 21   CREATININE 0.58 0.67 0.68 0.75 0.79  CALCIUM 8.0* 8.3* 8.8* 8.7* 8.2*  MG 2.2 1.9  --   --   --   AST 30 35 60* 40 21  ALT 18 20 51* 46* 34  ALKPHOS 64 56 70 65 59  BILITOT 0.4 0.6 0.7 0.6 0.7   ------------------------------------------------------------------------------------------------------------------ No results for input(s): CHOL, HDL, LDLCALC, TRIG, CHOLHDL, LDLDIRECT in  the last 72 hours.  Lab Results  Component Value Date   HGBA1C 5.7 (H) 02/06/2019   ------------------------------------------------------------------------------------------------------------------ No results for input(s): TSH, T4TOTAL, T3FREE, THYROIDAB in the last 72 hours.  Invalid input(s): FREET3 ------------------------------------------------------------------------------------------------------------------ Recent Labs    02/08/19 0420 02/09/19 0329  FERRITIN 232 162    Coagulation profile No results for input(s): INR, PROTIME in the last 168 hours.  Recent Labs    02/08/19 0420 02/09/19 0329  DDIMER 0.42 0.50    Cardiac Enzymes No results for input(s): CKMB, TROPONINI, MYOGLOBIN in the last 168 hours.  Invalid input(s): CK ------------------------------------------------------------------------------------------------------------------    Component Value Date/Time   BNP 178.8 (H) 02/06/2019 1322    Micro Results Recent Results (from the past 240 hour(s))  Blood culture (routine x 2)     Status: None   Collection Time: 02/04/19  1:30 PM   Specimen: BLOOD  Result Value Ref  Range Status   Specimen Description BLOOD R AC  Final   Special Requests   Final    BOTTLES DRAWN AEROBIC AND ANAEROBIC Blood Culture results may not be optimal due to an excessive volume of blood received in culture bottles   Culture   Final    NO GROWTH 5 DAYS Performed at [facility], [address] [place], [place] [address]    Report Status 02/09/2019 FINAL  Final  Respiratory Panel by RT PCR (Flu A&B, Covid) - Nasopharyngeal Swab     Status: Abnormal   Collection Time: 02/04/19  1:36 PM   Specimen: Nasopharyngeal Swab  Result Value Ref Range Status   SARS Coronavirus 2 by RT PCR POSITIVE (A) NEGATIVE Final    Comment: RESULT CALLED TO, READ BACK BY AND VERIFIED WITH: ASHLEY ORSUTO ON 02/04/2019 AT 1454 TIK (NOTE) SARS-CoV-2 target nucleic acids are  DETECTED. SARS-CoV-2 RNA is generally detectable in upper respiratory specimens  during the acute phase of infection. Positive results are indicative of the presence of the identified virus, but do not rule out bacterial infection or co-infection with other pathogens not detected by the test. Clinical correlation with patient history and other diagnostic information is necessary to determine patient infection status. The expected result is Negative. Fact Sheet for Patients:  PinkCheek.be Fact Sheet for Healthcare Providers: GravelBags.it This test is not yet approved or cleared by the [place] FDA and  has been authorized for detection and/or diagnosis of SARS-CoV-2 by FDA under an Emergency Use Authorization (EUA).  This EUA will remain in effect (meaning this test can be use d) for the duration of  the COVID-19 declaration under Section 564(b)(1) of the Act, 21 U.S.C. section 360bbb-3(b)(1), unless the authorization is terminated or revoked sooner.    Influenza A by PCR NEGATIVE NEGATIVE Final   Influenza B by PCR NEGATIVE NEGATIVE Final    Comment: (NOTE) The Xpert Xpress SARS-CoV-2/FLU/RSV assay is intended as an aid in  the diagnosis of influenza from Nasopharyngeal swab specimens and  should not be used as a sole basis for treatment. Nasal washings and  aspirates are unacceptable for Xpert Xpress SARS-CoV-2/FLU/RSV  testing. Fact Sheet for Patients: PinkCheek.be Fact Sheet for Healthcare Providers: GravelBags.it This test is not yet approved or cleared by the [place] FDA and  has been authorized for detection and/or diagnosis of SARS-CoV-2 by  FDA under an Emergency Use Authorization (EUA). This EUA will remain  in effect (meaning this test can be used) for the duration of the  Covid-19 declaration under Section 564(b)(1) of the Act, 21  U.S.C.  section 360bbb-3(b)(1), unless the authorization is  terminated or revoked. Performed at [facility], [address] [place], [place] [address]   Blood culture (routine x 2)     Status: None   Collection Time: 02/04/19  2:51 PM   Specimen: BLOOD  Result Value Ref Range Status   Specimen Description BLOOD BLOOD RIGHT HAND  Final   Special Requests   Final    BOTTLES DRAWN AEROBIC AND ANAEROBIC Blood Culture results may not be optimal due to an inadequate volume of blood received in culture bottles   Culture   Final    NO GROWTH 5 DAYS Performed at [facility] [address], [place], [place] [address]    Report Status 02/09/2019 FINAL  Final    Radiology Reports DG Chest Portable 1 View  Result Date: 02/04/2019 CLINICAL DATA:  Shortness of breath. EXAM: PORTABLE  CHEST 1 VIEW COMPARISON:  Chest x-ray dated 01/15/2008. FINDINGS: Heart size and mediastinal contours are within normal limits. Patchy peripheral airspace opacities, LEFT slightly greater than RIGHT. No pleural effusion or pneumothorax is seen. Osseous structures about the chest are unremarkable. IMPRESSION: Patchy peripheral airspace opacities, LEFT slightly greater than RIGHT, compatible with multifocal pneumonia versus pulmonary edema. Electronically Signed   By: Franki Cabot M.D.   On: 02/04/2019 13:16

## 2019-02-09 NOTE — TOC Initial Note (Signed)
Transition of Care [facility]) - Initial/Assessment Note    Patient Details  Name: Leslie Koch MRN: [id number] Date of Birth: [birth year]  [facility] CM/SW Contact:    Geralynn Ochs, LCSW Phone Number: 02/09/2019, 4:57 PM  Clinical Narrative:    CSW spoke with patient about hopeful discharge home, needs oxygen and home health. Patient has used [employer] for equipment in the past, would like to use them for oxygen need. CSW asked about home health, and patient felt like she wouldn't need it; she has had home health in the past for multiple knee surgeries and says she knows how to help her get her strength back up. Patient says she can reach out to her PCP if she gets home and feels that she needs it after all. CSW spoke with [employer] to provide referral for oxygen, and they will deliver to the patient's home. CSW to follow.          Expected Discharge Plan: Home/Self Care Barriers to Discharge: Continued Medical Work up   Patient Goals and CMS Choice Patient states their goals for this hospitalization and ongoing recovery are:: to get back home CMS Medicare.gov Compare Post Acute Care list provided to:: Patient Choice offered to / list presented to : Patient  Expected Discharge Plan and Services Expected Discharge Plan: Home/Self Care     Post Acute Care Choice: Durable Medical Equipment Living arrangements for the past 2 months: Single Family Home                 DME Arranged: Oxygen DME Agency: [facility] Date DME Agency Contacted: 02/09/19 Time DME Agency Contacted: [phone] Representative spoke with at DME Agency: [place]            Prior Living Arrangements/Services Living arrangements for the past 2 months: [place] Lives with:: Spouse Patient language and need for interpreter reviewed:: No Do you feel safe going back to the place where you live?: Yes      Need for Family Participation in Patient Care: No (Comment) Care giver support system in place?:  Yes (comment) Current home services: DME Criminal Activity/Legal Involvement Pertinent to Current Situation/Hospitalization: No - Comment as needed  Activities of Daily Living Home Assistive Devices/Equipment: None ADL Screening (condition at time of admission) Patient's cognitive ability adequate to safely complete daily activities?: Yes Is the patient deaf or have difficulty hearing?: No Does the patient have difficulty seeing, even when wearing glasses/contacts?: No Does the patient have difficulty concentrating, remembering, or making decisions?: No Patient able to express need for assistance with ADLs?: Yes Does the patient have difficulty dressing or bathing?: No Independently performs ADLs?: Yes (appropriate for developmental age) Does the patient have difficulty walking or climbing stairs?: No Weakness of Legs: None Weakness of Arms/Hands: None  Permission Sought/Granted Permission sought to share information with : Chartered certified accountant granted to share information with : Yes, Verbal Permission Granted  Share Information with NAME: Leslie Koch     Permission granted to share info w Relationship: Husband, Mother     Emotional Assessment   Attitude/Demeanor/Rapport: Engaged Affect (typically observed): Pleasant Orientation: : Oriented to Self, Oriented to Place, Oriented to  Time, Oriented to Situation Alcohol / Substance Use: Not Applicable Psych Involvement: No (comment)  Admission diagnosis:  Acute respiratory failure with hypoxia ([place]) [J96.01] Patient Active Problem List   Diagnosis Date Noted  . Essential hypertension 02/06/2019  . Acute respiratory disease due to COVID-19 virus 02/06/2019  . Acute  respiratory failure with hypoxia ([place]) 02/06/2019  . Pneumonia due to COVID-19 virus 02/04/2019  . Lymphedema 10/27/2016  . Chronic venous insufficiency 10/27/2016  . Leg swelling 08/24/2016  . SOB (shortness of breath) 08/24/2016  . Smoker  08/24/2016  . 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 Failed total knee arthroplasty ([place]) 02/11/2016  . Hot flashes, menopausal 08/14/2014  . Cystocele 08/09/2014  . Rectocele 08/09/2014  . Anxiety 08/06/2014  . Acid reflux 08/06/2014  . Hypercholesteremia 08/06/2014  . Benign essential HTN 08/06/2014  . Cannot sleep 08/06/2014  . Post hysterectomy menopause 08/06/2014  . Avitaminosis D 08/06/2014   PCP:  Mar Daring, [name] Pharmacy:   [id number] - [place], [address] [place] [address] Phone: [phone] Fax: [phone]  [place], [facility] [address] [place] [address] [place] [address] Phone: [phone] Fax: [phone]     Social Determinants of Health ([place]) Interventions    Readmission Risk Interventions No flowsheet data found.

## 2019-02-10 DIAGNOSIS — J9601 Acute respiratory failure with hypoxia: Secondary | ICD-10-CM

## 2019-02-10 LAB — COMPREHENSIVE METABOLIC PANEL
ALT: 29 U/L (ref 0–44)
AST: 18 U/L (ref 15–41)
Albumin: 3 g/dL — ABNORMAL LOW (ref 3.5–5.0)
Alkaline Phosphatase: 59 U/L (ref 38–126)
Anion gap: 12 (ref 5–15)
BUN: 20 mg/dL (ref 8–23)
CO2: 26 mmol/L (ref 22–32)
Calcium: 8.4 mg/dL — ABNORMAL LOW (ref 8.9–10.3)
Chloride: 97 mmol/L — ABNORMAL LOW (ref 98–111)
Creatinine, Ser: 0.71 mg/dL (ref 0.44–1.00)
GFR calc Af Amer: >60 mL/min (ref >60)
GFR calc non Af Amer: >60 mL/min (ref >60)
Glucose, Bld: 196 mg/dL — ABNORMAL HIGH (ref 70–99)
Potassium: 3.8 mmol/L (ref 3.5–5.1)
Sodium: 135 mmol/L (ref 135–145)
Total Bilirubin: 0.6 mg/dL (ref 0.3–1.2)
Total Protein: 6.4 g/dL — ABNORMAL LOW (ref 6.5–8.1)

## 2019-02-10 LAB — CBC WITH DIFFERENTIAL/PLATELET
Abs Immature Granulocytes: 0.28 10*3/uL — ABNORMAL HIGH (ref 0.00–0.07)
Basophils Absolute: 0.1 10*3/uL (ref 0.0–0.1)
Basophils Relative: 1 %
Eosinophils Absolute: 0 10*3/uL (ref 0.0–0.5)
Eosinophils Relative: 0 %
HCT: 40.8 % (ref 36.0–46.0)
Hemoglobin: 13.5 g/dL (ref 12.0–15.0)
Immature Granulocytes: 3 %
Lymphocytes Relative: 11 %
Lymphs Abs: 1 10*3/uL (ref 0.7–4.0)
MCH: 31.1 pg (ref 26.0–34.0)
MCHC: 33.1 g/dL (ref 30.0–36.0)
MCV: 94 fL (ref 80.0–100.0)
Monocytes Absolute: 0.4 10*3/uL (ref 0.1–1.0)
Monocytes Relative: 5 %
Neutro Abs: 7.4 10*3/uL (ref 1.7–7.7)
Neutrophils Relative %: 80 %
Platelets: 609 10*3/uL — ABNORMAL HIGH (ref 150–400)
RBC: 4.34 MIL/uL (ref 3.87–5.11)
RDW: 12.7 % (ref 11.5–15.5)
WBC: 9.2 10*3/uL (ref 4.0–10.5)
nRBC: 0 % (ref 0.0–0.2)

## 2019-02-10 LAB — GLUCOSE, CAPILLARY
Comment 1: 0
Glucose-Capillary: 100 mg/dL — ABNORMAL HIGH (ref 70–99)
Glucose-Capillary: 112 mg/dL — ABNORMAL HIGH (ref 70–99)
Glucose-Capillary: 93 mg/dL (ref 70–99)
Glucose-Capillary: 128 mg/dL — ABNORMAL HIGH (ref 70–99)

## 2019-02-10 LAB — C-REACTIVE PROTEIN: CRP: 1.4 mg/dL — ABNORMAL HIGH (ref <1.0)

## 2019-02-10 LAB — D-DIMER, QUANTITATIVE: D-Dimer, Quant: 0.39 ug/mL-FEU (ref 0.00–0.50)

## 2019-02-10 LAB — FERRITIN: Ferritin: 142 ng/mL (ref 11–307)

## 2019-02-10 MED ORDER — ENOXAPARIN SODIUM 40 MG/0.4ML ~~LOC~~ SOLN
40.0000 mg | Freq: Every morning | SUBCUTANEOUS | Status: DC
  Administered 2019-02-10 – 2019-02-11 (×2): 40 mg via SUBCUTANEOUS
  Filled 2019-02-10 (×2): qty 0.4

## 2019-02-10 MED ORDER — METHYLPREDNISOLONE SODIUM SUCC 40 MG IJ SOLR
10.0000 mg | Freq: Every day | INTRAMUSCULAR | Status: DC
  Administered 2019-02-11: 09:00:00 6 mg via INTRAVENOUS
  Filled 2019-02-10: qty 1

## 2019-02-10 MED ORDER — POTASSIUM CHLORIDE CRYS ER 20 MEQ PO TBCR
20.0000 meq | EXTENDED_RELEASE_TABLET | Freq: Once | ORAL | Status: AC
  Administered 2019-02-10: 12:00:00 20 meq via ORAL
  Filled 2019-02-10: qty 1

## 2019-02-10 MED ORDER — FUROSEMIDE 10 MG/ML IJ SOLN
40.0000 mg | Freq: Once | INTRAMUSCULAR | Status: AC
  Administered 2019-02-10: 12:00:00 40 mg via INTRAVENOUS
  Filled 2019-02-10: qty 4

## 2019-02-10 NOTE — Progress Notes (Signed)
PROGRESS NOTE                                                                                                                                                                                                             Patient Demographics:    Leslie Koch, is a 64 y.o. female, DOB - 1955/06/08, [id number]  Outpatient Primary MD for the patient is Rubye Beach   Admit date - 02/06/2019   LOS - 4     Brief narrative: 64 year old female with HTN, GERD, dyslipidemia, obesity-who was exposed to COVID-19 at her workplace-s/p first dose of Covid 19 vaccine on 1/11-presented to the hospital with 2-day history of fever, chills, malaise-was COVID-19 positive-and subsequently found to have acute hypoxic respiratory failure secondary to pneumonia.  She was admitted over at [facility] developed worsening hypoxemia-and was transferred to [facility] for further management and evaluation.  Significant events: 1/11: First dose of COVID-19 vaccine 1/12: Reported negative Covid test as outpatient 1/19: Reported negative Covid test as outpatient 1/24: Covid positive-and admitted to [facility] for hypoxemia due to COVID-19 pneumonia 1/26: Transferred to [facility] for worsening hypoxemia on 10 L of oxygen via [facility]NC  COVID-19 medications: Steroids: 1/24>> Remdesivir: 1/24>> 1/28 Actemra: 1/25 x 1, 1/26 x1 Plasma: 1/26 x1   Subjective:   Patient in bed, appears comfortable, denies any headache, no fever, no chest pain or pressure, no shortness of breath , no abdominal pain. No focal weakness.    Assessment  & Plan :   Acute Hypoxic Resp Failure due to Covid 19 Viral pneumonia: She had severe disease and was treated with full scope of treatment which included IV steroids, remdesivir, convalescent plasma and 2 doses of IV Actemra on the 25th and 26 January.  She has shown remarkable improvement and currently on 1 L nasal cannula oxygen on  ambulation and movement.  Will start tapering down steroids, advance activity, prepare for discharge.  May require oxygen at home.    COVID-19 Labs: Recent Labs    02/08/19 0420 02/09/19 0329 02/10/19 0310  DDIMER 0.42 0.50 0.39  FERRITIN 232 162 142  CRP 2.8* 1.4* 1.4*       Component Value Date/Time   BNP 178.8 (H) 02/06/2019 1322    Recent Labs  Lab 02/04/19 1452  PROCALCITON <0.10    Lab Results  Component Value  Date   SARSCOV2NAA POSITIVE (A) 02/04/2019    Oral candidiasis: Continue Diflucan-improved.  Antifungal agents  Diflucan: 1/28 x 5 days  HTN: Controlled-continue lisinopril  Dyslipidemia: Continue statin  Obesity: BMI 37, follow with PCP for weight loss.  Consults  :  None  Procedures  :  None    Condition - Guarded  Family Communication  : Previous MD spoke with the spouse updated over the phone 1/28  Code Status :  Full Code  Diet :  Diet Order            Diet Heart Room service appropriate? Yes; Fluid consistency: Thin  Diet effective now               Disposition Plan  : Still hypoxic and on oxygen, likely discharge in the next 1 to 2 days to home if oxygen requirements continue to come down.  Antimicorbials  :    Anti-infectives (From admission, onward)   Start     Dose/Rate Route Frequency Ordered Stop   02/08/19 1600  fluconazole (DIFLUCAN) tablet 100 mg     100 mg Oral Daily 02/08/19 1558 02/13/19 0959   02/07/19 1000  remdesivir 100 mg in sodium chloride 0.9 % 100 mL IVPB  Status:  Discontinued     100 mg 200 mL/hr over 30 Minutes Intravenous Daily 02/06/19 1151 02/06/19 1159   02/07/19 1000  remdesivir 100 mg in sodium chloride 0.9 % 100 mL IVPB     100 mg 200 mL/hr over 30 Minutes Intravenous Daily 02/06/19 1201 02/08/19 1236   02/07/19 1000  remdesivir 100 mg in sodium chloride 0.9 % 100 mL IVPB  Status:  Discontinued     100 mg 200 mL/hr over 30 Minutes Intravenous Daily 02/06/19 1239 02/06/19 1239   02/06/19 1200   remdesivir 200 mg in sodium chloride 0.9% 250 mL IVPB  Status:  Discontinued     200 mg 580 mL/hr over 30 Minutes Intravenous Once 02/06/19 1151 02/06/19 1159      DVT Prophylaxis  :  Lovenox-intermediate dosing  Inpatient Medications  Scheduled Meds: . vitamin C  500 mg Oral Daily  . enoxaparin (LOVENOX) injection  40 mg Subcutaneous Q12H  . fluconazole  100 mg Oral Daily  . insulin aspart  0-9 Units Subcutaneous TID WC  . Ipratropium-Albuterol  1 puff Inhalation Q6H  . lisinopril  20 mg Oral Daily  . loratadine  10 mg Oral Daily  . methylPREDNISolone (SOLU-MEDROL) injection  20 mg Intravenous Q12H  . pantoprazole  40 mg Oral BID  . simvastatin  40 mg Oral QHS  . sodium chloride flush  3 mL Intravenous Q12H  . zinc sulfate  220 mg Oral Daily   Continuous Infusions: . sodium chloride     PRN Meds:.sodium chloride, acetaminophen, alum & mag hydroxide-simeth, chlorpheniramine-HYDROcodone, guaiFENesin-dextromethorphan, magic mouthwash, [DISCONTINUED] ondansetron **OR** ondansetron (ZOFRAN) IV, polyethylene glycol, senna, sodium chloride, sodium chloride flush    Time Spent in minutes  25  Lala Lund M.D on 02/10/2019 at 10:34 AM  To page go to [email or web address] - use universal password  [facility] -  Office  [phone]    Objective:   Vitals:   02/10/19 0410 02/10/19 0737 02/10/19 0757 02/10/19 0921  BP: 110/71 117/68    Pulse: 81 72    Resp: 18 14    Temp:   98.5 F (36.9 C)   TempSrc:   Oral   SpO2: 92% 92%  90%  Weight:  Height:        Wt Readings from Last 3 Encounters:  02/06/19 91 kg  02/04/19 90.7 kg  02/21/17 98.4 kg    No intake or output data in the 24 hours ending 02/10/19 1034   Physical Exam  Awake Alert,  No new F.N deficits, Normal affect [place].AT,PERRAL Supple Neck,No JVD, No cervical lymphadenopathy appriciated.  Symmetrical Chest wall movement, Good air movement bilaterally, CTAB RRR,No Gallops, Rubs or new Murmurs, No  Parasternal Heave +ve B.Sounds, Abd Soft, No tenderness, No organomegaly appriciated, No rebound - guarding or rigidity. No Cyanosis, Clubbing or edema, No new Rash or bruise    Data Review:    CBC Recent Labs  Lab 02/06/19 0633 02/07/19 0326 02/08/19 0420 02/09/19 0329 02/10/19 0310  WBC 5.8 5.6 6.5 6.2 9.2  HGB 12.3 13.1 13.6 13.0 13.5  HCT 36.7 37.8 40.8 38.4 40.8  PLT 403* 499* 575* 522* 609*  MCV 92.0 92.0 93.8 93.0 94.0  MCH 30.8 31.9 31.3 31.5 31.1  MCHC 33.5 34.7 33.3 33.9 33.1  RDW 12.3 12.4 12.5 12.5 12.7  LYMPHSABS 1.5 0.9 1.1 1.0 1.0  MONOABS 0.5 0.3 0.3 0.3 0.4  EOSABS 0.0 0.0 0.0 0.0 0.0  BASOSABS 0.0 0.0 0.0 0.0 0.1    Chemistries  Recent Labs  Lab 02/05/19 0436 02/05/19 0436 02/06/19 0633 02/07/19 0326 02/08/19 0420 02/09/19 0329 02/10/19 0310  NA 139   < > 139 139 136 136 135  K 4.3   < > 3.6 3.8 3.8 3.7 3.8  CL 106   < > 102 97* 96* 96* 97*  CO2 25   < > 27 26 28 29 26   GLUCOSE 113*   < > 87 133* 162* 156* 196*  BUN 12   < > 15 15 21 21 20   CREATININE 0.58   < > 0.67 0.68 0.75 0.79 0.71  CALCIUM 8.0*   < > 8.3* 8.8* 8.7* 8.2* 8.4*  MG 2.2  --  1.9  --   --   --   --   AST 30   < > 35 60* 40 21 18  ALT 18   < > 20 51* 46* 34 29  ALKPHOS 64   < > 56 70 65 59 59  BILITOT 0.4   < > 0.6 0.7 0.6 0.7 0.6   < > = values in this interval not displayed.   ------------------------------------------------------------------------------------------------------------------ No results for input(s): CHOL, HDL, LDLCALC, TRIG, CHOLHDL, LDLDIRECT in the last 72 hours.  Lab Results  Component Value Date   HGBA1C 5.7 (H) 02/06/2019   ------------------------------------------------------------------------------------------------------------------ No results for input(s): TSH, T4TOTAL, T3FREE, THYROIDAB in the last 72 hours.  Invalid input(s): FREET3  ------------------------------------------------------------------------------------------------------------------ Recent Labs    02/09/19 0329 02/10/19 0310  FERRITIN 162 142    Coagulation profile No results for input(s): INR, PROTIME in the last 168 hours.  Recent Labs    02/09/19 0329 02/10/19 0310  DDIMER 0.50 0.39    Cardiac Enzymes No results for input(s): CKMB, TROPONINI, MYOGLOBIN in the last 168 hours.  Invalid input(s): CK ------------------------------------------------------------------------------------------------------------------    Component Value Date/Time   BNP 178.8 (H) 02/06/2019 1322    Micro Results Recent Results (from the past 240 hour(s))  Blood culture (routine x 2)     Status: None   Collection Time: 02/04/19  1:30 PM   Specimen: BLOOD  Result Value Ref Range Status   Specimen Description BLOOD R In[facility] Surgical  Final   Special Requests  Final    BOTTLES DRAWN AEROBIC AND ANAEROBIC Blood Culture results may not be optimal due to an excessive volume of blood received in culture bottles   Culture   Final    NO GROWTH 5 DAYS Performed at [facility], [address] [place], [place] [address]    Report Status 02/09/2019 FINAL  Final  Respiratory Panel by RT PCR (Flu A&B, Covid) - Nasopharyngeal Swab     Status: Abnormal   Collection Time: 02/04/19  1:36 PM   Specimen: Nasopharyngeal Swab  Result Value Ref Range Status   SARS Coronavirus 2 by RT PCR POSITIVE (A) NEGATIVE Final    Comment: RESULT CALLED TO, READ BACK BY AND VERIFIED WITH: ASHLEY ORSUTO ON 02/04/2019 AT 1454 TIK (NOTE) SARS-CoV-2 target nucleic acids are DETECTED. SARS-CoV-2 RNA is generally detectable in upper respiratory specimens  during the acute phase of infection. Positive results are indicative of the presence of the identified virus, but do not rule out bacterial infection or co-infection with other pathogens not detected by the test. Clinical correlation with  patient history and other diagnostic information is necessary to determine patient infection status. The expected result is Negative. Fact Sheet for Patients:  PinkCheek.be Fact Sheet for Healthcare Providers: GravelBags.it This test is not yet approved or cleared by the [place] FDA and  has been authorized for detection and/or diagnosis of SARS-CoV-2 by FDA under an Emergency Use Authorization (EUA).  This EUA will remain in effect (meaning this test can be use d) for the duration of  the COVID-19 declaration under Section 564(b)(1) of the Act, 21 U.S.C. section 360bbb-3(b)(1), unless the authorization is terminated or revoked sooner.    Influenza A by PCR NEGATIVE NEGATIVE Final   Influenza B by PCR NEGATIVE NEGATIVE Final    Comment: (NOTE) The Xpert Xpress SARS-CoV-2/FLU/RSV assay is intended as an aid in  the diagnosis of influenza from Nasopharyngeal swab specimens and  should not be used as a sole basis for treatment. Nasal washings and  aspirates are unacceptable for Xpert Xpress SARS-CoV-2/FLU/RSV  testing. Fact Sheet for Patients: PinkCheek.be Fact Sheet for Healthcare Providers: GravelBags.it This test is not yet approved or cleared by the [place] FDA and  has been authorized for detection and/or diagnosis of SARS-CoV-2 by  FDA under an Emergency Use Authorization (EUA). This EUA will remain  in effect (meaning this test can be used) for the duration of the  Covid-19 declaration under Section 564(b)(1) of the Act, 21  U.S.C. section 360bbb-3(b)(1), unless the authorization is  terminated or revoked. Performed at [facility], [address] [place], [place] [address]   Blood culture (routine x 2)     Status: None   Collection Time: 02/04/19  2:51 PM   Specimen: BLOOD  Result Value Ref Range Status   Specimen Description BLOOD  BLOOD RIGHT HAND  Final   Special Requests   Final    BOTTLES DRAWN AEROBIC AND ANAEROBIC Blood Culture results may not be optimal due to an inadequate volume of blood received in culture bottles   Culture   Final    NO GROWTH 5 DAYS Performed at [facility], [address], [place],  [address]    Report Status 02/09/2019 FINAL  Final    Radiology Reports DG Chest Portable 1 View  Result Date: 02/04/2019 CLINICAL DATA:  Shortness of breath. EXAM: PORTABLE CHEST 1 VIEW COMPARISON:  Chest x-ray dated 01/15/2008. FINDINGS: Heart size and mediastinal contours are within  normal limits. Patchy peripheral airspace opacities, LEFT slightly greater than RIGHT. No pleural effusion or pneumothorax is seen. Osseous structures about the chest are unremarkable. IMPRESSION: Patchy peripheral airspace opacities, LEFT slightly greater than RIGHT, compatible with multifocal pneumonia versus pulmonary edema. Electronically Signed   By: Franki Cabot M.D.   On: 02/04/2019 13:16

## 2019-02-10 NOTE — Plan of Care (Signed)
Plan of Care reviewed. 

## 2019-02-10 NOTE — Plan of Care (Signed)
  Problem: Education: Goal: Knowledge of risk factors and measures for prevention of condition will improve Outcome: Progressing   Problem: Coping: Goal: Psychosocial and spiritual needs will be supported Outcome: Progressing   Problem: Respiratory: Goal: Will maintain a patent airway Outcome: Progressing Goal: Complications related to the disease process, condition or treatment will be avoided or minimized Outcome: Progressing   

## 2019-02-11 LAB — GLUCOSE, CAPILLARY: Glucose-Capillary: 85 mg/dL (ref 70–99)

## 2019-02-11 MED ORDER — ALBUTEROL SULFATE HFA 108 (90 BASE) MCG/ACT IN AERS: 2.0000 | INHALATION_SPRAY | Freq: Four times a day (QID) | RESPIRATORY_TRACT | 0 refills | Status: DC | PRN

## 2019-02-11 MED ORDER — METHYLPREDNISOLONE SODIUM SUCC 40 MG IJ SOLR: 6.0000 mg | Freq: Every day | INTRAMUSCULAR | Status: DC

## 2019-02-11 NOTE — TOC Transition Note (Signed)
Transition of Care [facility]) - CM/SW Discharge Note   Patient Details  Name: Leslie Koch MRN: [id number] Date of Birth: [birth year]  Transition of Care [facility]) CM/SW Contact:  Geralynn Ochs, LCSW Phone Number: 02/11/2019, 10:32 AM   Clinical Narrative:   CSW notified that patient no longer needs home oxygen. CSW alerted [employer], and they will pick up the oxygen from patient's home. RN to place the portable tank from the patient's room back into the [employer] closet on site. No further needs at this time, CSW signing off.    Final next level of care: Home/Self Care Barriers to Discharge: Barriers Resolved   Patient Goals and CMS Choice Patient states their goals for this hospitalization and ongoing recovery are:: to get back home CMS Medicare.gov Compare Post Acute Care list provided to:: Patient Choice offered to / list presented to : Patient  Discharge Placement                       Discharge Plan and Services     Post Acute Care Choice: Durable Medical Equipment          DME Arranged: Oxygen DME Agency: [employer] Date DME Agency Contacted: 02/09/19 Time DME Agency Contacted: [phone] Representative spoke with at DME Agency: [place] ([place]) Interventions     Readmission Risk Interventions No flowsheet data found.

## 2019-02-11 NOTE — Progress Notes (Signed)
                                      [address]                            [place], [place] [address]      Leslie Koch was admitted to the Hospital on 02/06/2019 and Discharged  02/11/2019 and should be excused from work/school   for 21 days starting from date -  02/06/2019 , may return to work/school without any restrictions.  Call Lala Lund MD, [facility]  [phone] with questions.  Lala Lund M.D on 02/11/2019,at 10:18 AM  [facility]   Office  [phone]

## 2019-02-11 NOTE — Discharge Summary (Signed)
Leslie Koch SUO:[id number] DOB: Oct 03, 1955 DOA: 02/06/2019  PCP: Mar Daring, [name]  Admit date: 02/06/2019  Discharge date: 02/11/2019  Admitted From: Home  Disposition:  Home   Recommendations for Outpatient Follow-up:   Follow up with PCP in 1-2 weeks  PCP Please obtain BMP/CBC, 2 view CXR in 1week,  (see Discharge instructions)   PCP Please follow up on the following pending results: Monitor BP   Home Health: None   Equipment/Devices: None  Consultations: None  Discharge Condition: Stable    CODE STATUS: Full    Diet Recommendation: Heart Healthy   CC - SOB   Brief history of present illness from the day of admission and additional interim summary    64 year old female with HTN, GERD, dyslipidemia, obesity-who was exposed to COVID-19 at her workplace-s/p first dose of Covid 19 vaccine on 1/11-presented to the hospital with 2-day history of fever, chills, malaise-was COVID-19 positive-and subsequently found to have acute hypoxic respiratory failure secondary to pneumonia. She was admitted over at [facility] developed worsening hypoxemia-and was transferred to [facility] for further management and evaluation.  Significant events: 1/11: First dose of COVID-19 vaccine 1/12: Reported negative Covid test asoutpatient 1/19: Reported negative Covid test as outpatient 1/24:Covid positive-and admitted to [facility] for hypoxemia due to COVID-19 pneumonia 1/26: Transferred to [facility] for worsening hypoxemia on 10 L of oxygen via [facility]  COVID-19 medications: Steroids: 1/24>> Remdesivir: 1/24>> 1/28 Actemra: 1/25 x 1, 1/26 x1 Plasma: 1/26 x1                                                                 Hospital Course   Acute Hypoxic Resp Failure due to Covid 19 Viral pneumonia: She had severe disease and was  treated with full scope of treatment which included IV steroids, remdesivir, convalescent plasma and 2 doses of IV Actemra on the 25th and 26 January.  She has shown remarkable improvement and currently on room air at rest and upon ambulation, completely symptom-free will be discharged home with PCP follow-up and a rescue inhaler.   Recent Labs  Lab 02/04/19 1336 02/04/19 1452 02/05/19 0436 02/06/19 3276 02/06/19 1470 02/06/19 1322 02/07/19 0326 02/08/19 0420 02/09/19 0329 02/10/19 0310  CRP  --   --    < > 8.3*   < > 7.9* 5.1* 2.8* 1.4* 1.4*  DDIMER  --   --   --   --   --   --  0.64* 0.42 0.50 0.39  FERRITIN  --   --    < > 307  --   --  284 232 162 142  BNP  --   --   --   --   --  178.8*  --   --   --   --   PROCALCITON  --  <  0.10  --   --   --   --   --   --   --   --   SARSCOV2NAA POSITIVE*  --   --   --   --   --   --   --   --   --    < > = values in this interval not displayed.    Hepatic Function Latest Ref Rng & Units 02/10/2019 02/09/2019 02/08/2019  Total Protein 6.5 - 8.1 g/dL 6.4(L) 6.3(L) 7.1  Albumin 3.5 - 5.0 g/dL 3.0(L) 3.0(L) 3.2(L)  AST 15 - 41 U/L 18 21 40  ALT 0 - 44 U/L 29 34 46(H)  Alk Phosphatase 38 - 126 U/L 59 59 65  Total Bilirubin 0.3 - 1.2 mg/dL 0.6 0.7 0.6    Oral candidiasis: Treated with Diflucan.  VCB:SWHQPRFFMB low blood pressure medications held.  PCP to monitor.  Dyslipidemia:Continue statin  Obesity: BMI 37, follow with PCP for weight loss.   Discharge diagnosis     Principal Problem:   Acute respiratory failure with hypoxia (HCC) Active Problems:   Anxiety   Acid reflux   Hypercholesteremia   Pneumonia due to COVID-19 virus   Essential hypertension    Discharge instructions    Discharge Instructions    Diet - low sodium heart healthy   Complete by: As directed    Discharge instructions   Complete by: As directed    Follow with Primary MD Mar Daring, [name] in 7 days   Get CBC, CMP, 2 view Chest X ray -   checked next visit within 1 week by Primary MD   Activity: As tolerated with Full fall precautions use walker/cane & assistance as needed  Disposition Home   Diet: Heart Healthy    Special Instructions: If you have smoked or chewed Tobacco  in the last 2 yrs please stop smoking, stop any regular Alcohol  and or any Recreational drug use.  On your next visit with your primary care physician please Get Medicines reviewed and adjusted.  Please request your Prim.MD to go over all Hospital Tests and Procedure/Radiological results at the follow up, please get all Hospital records sent to your Prim MD by signing hospital release before you go home.  If you experience worsening of your admission symptoms, develop shortness of breath, life threatening emergency, suicidal or homicidal thoughts you must seek medical attention immediately by calling 911 or calling your MD immediately  if symptoms less severe.  You Must read complete instructions/literature along with all the possible adverse reactions/side effects for all the Medicines you take and that have been prescribed to you. Take any new Medicines after you have completely understood and accpet all the possible adverse reactions/side effects.   Increase activity slowly   Complete by: As directed    MyChart COVID-19 home monitoring program   Complete by: Feb 11, 2019    Is the patient willing to use the [place] for home monitoring?: Yes   Temperature monitoring   Complete by: Feb 11, 2019    After how many days would you like to receive a notification of this patient's flowsheet entries?: 1      Discharge Medications   Allergies as of 02/11/2019      Reactions   Augmentin [amoxicillin-pot Clavulanate]    diarrhea   Montelukast Sodium Rash      Medication List    STOP taking these medications   dexamethasone 6 MG tablet Commonly known  as: DECADRON   enoxaparin 100 MG/ML injection Commonly known as: LOVENOX     lisinopril-hydrochlorothiazide 20-12.5 MG tablet Commonly known as: ZESTORETIC     TAKE these medications   acetaminophen 325 MG tablet Commonly known as: TYLENOL Take 2 tablets (650 mg total) by mouth every 6 (six) hours as needed for mild pain or headache (fever >/= 101).   albuterol 108 (90 Base) MCG/ACT inhaler Commonly known as: VENTOLIN HFA Inhale 2 puffs into the lungs every 6 (six) hours as needed for wheezing or shortness of breath.   ascorbic acid 500 MG tablet Commonly known as: VITAMIN C Take 1 tablet (500 mg total) by mouth daily.   aspirin EC 81 MG tablet Take 81 mg by mouth daily.   CALCIUM 500/D PO Take 1 tablet by mouth daily.   chlorpheniramine-HYDROcodone 10-8 MG/5ML Suer Commonly known as: TUSSIONEX Take 5 mLs by mouth every 12 (twelve) hours as needed for cough.   estradiol 1 MG tablet Commonly known as: ESTRACE TAKE 1 TABLET BY MOUTH  DAILY   furosemide 40 MG tablet Commonly known as: LASIX Take 1 tablet (40 mg total) by mouth 2 (two) times daily as needed. What changed:   how much to take  when to take this   Glucosamine-Chondroitin 750-600 MG Tabs Take 1 tablet by mouth as directed.   guaiFENesin-dextromethorphan 100-10 MG/5ML syrup Commonly known as: ROBITUSSIN DM Take 10 mLs by mouth every 4 (four) hours as needed for cough.   Ipratropium-Albuterol 20-100 MCG/ACT Aers respimat Commonly known as: COMBIVENT Inhale 1 puff into the lungs every 6 (six) hours.   multivitamin tablet Take 1 tablet by mouth daily.   Omega-3 1000 MG Caps Take 2,000 mg by mouth daily.   oxymetazoline 0.05 % nasal spray Commonly known as: AFRIN Place 1-2 sprays into both nostrils 2 (two) times daily as needed for congestion.   pantoprazole 40 MG tablet Commonly known as: PROTONIX Take 1 tablet (40 mg total) by mouth daily.   polyethylene glycol 17 g packet Commonly known as: MIRALAX / GLYCOLAX Take 17 g by mouth daily as needed for mild  constipation.   potassium chloride 10 MEQ tablet Commonly known as: KLOR-CON Take 1 tablet (10 mEq total) by mouth 2 (two) times daily as needed. What changed: when to take this   senna-docusate 8.6-50 MG tablet Commonly known as: Senokot-S Take 1 tablet by mouth at bedtime.   simvastatin 40 MG tablet Commonly known as: ZOCOR Take 1 tablet (40 mg total) by mouth at bedtime.   valACYclovir 1000 MG tablet Commonly known as: Valtrex Take 1-2 tablets (1,000-2,000 mg total) by mouth every 12 (twelve) hours as needed.   Vitamin D3 25 MCG (1000 UT) Caps Take 1,000 Units by mouth daily.   zinc sulfate 220 (50 Zn) MG capsule Take 1 capsule (220 mg total) by mouth daily.   ZyrTEC Allergy 10 MG tablet Generic drug: cetirizine Take 10 mg by mouth daily.       Follow-up Information    Mar Daring, [name]. Schedule an appointment as soon as possible for a visit in 1 week(s).   Specialty: Family Medicine Contact information: [address] [place] [address] [phone]           Major procedures and Radiology Reports - PLEASE review detailed and final reports thoroughly  -       DG Chest Portable 1 View  Result Date: 02/04/2019 CLINICAL DATA:  Shortness of breath. EXAM: PORTABLE CHEST 1  VIEW COMPARISON:  Chest x-ray dated 01/15/2008. FINDINGS: Heart size and mediastinal contours are within normal limits. Patchy peripheral airspace opacities, LEFT slightly greater than RIGHT. No pleural effusion or pneumothorax is seen. Osseous structures about the chest are unremarkable. IMPRESSION: Patchy peripheral airspace opacities, LEFT slightly greater than RIGHT, compatible with multifocal pneumonia versus pulmonary edema. Electronically Signed   By: Franki Cabot M.D.   On: 02/04/2019 13:16    Micro Results     Recent Results (from the past 240 hour(s))  Blood culture (routine x 2)     Status: None   Collection Time: 02/04/19  1:30 PM   Specimen: BLOOD   Result Value Ref Range Status   Specimen Description BLOOD R AC  Final   Special Requests   Final    BOTTLES DRAWN AEROBIC AND ANAEROBIC Blood Culture results may not be optimal due to an excessive volume of blood received in culture bottles   Culture   Final    NO GROWTH 5 DAYS Performed at [facility], [address] [place], [place] [address]    Report Status 02/09/2019 FINAL  Final  Respiratory Panel by RT PCR (Flu A&B, Covid) - Nasopharyngeal Swab     Status: Abnormal   Collection Time: 02/04/19  1:36 PM   Specimen: Nasopharyngeal Swab  Result Value Ref Range Status   SARS Coronavirus 2 by RT PCR POSITIVE (A) NEGATIVE Final    Comment: RESULT CALLED TO, READ BACK BY AND VERIFIED WITH: ASHLEY ORSUTO ON 02/04/2019 AT 1914 TIK (NOTE) SARS-CoV-2 target nucleic acids are DETECTED. SARS-CoV-2 RNA is generally detectable in upper respiratory specimens  during the acute phase of infection. Positive results are indicative of the presence of the identified virus, but do not rule out bacterial infection or co-infection with other pathogens not detected by the test. Clinical correlation with patient history and other diagnostic information is necessary to determine patient infection status. The expected result is Negative. Fact Sheet for Patients:  PinkCheek.be Fact Sheet for Healthcare Providers: GravelBags.it This test is not yet approved or cleared by the [place] FDA and  has been authorized for detection and/or diagnosis of SARS-CoV-2 by FDA under an Emergency Use Authorization (EUA).  This EUA will remain in effect (meaning this test can be use d) for the duration of  the COVID-19 declaration under Section 564(b)(1) of the Act, 21 U.S.C. section 360bbb-3(b)(1), unless the authorization is terminated or revoked sooner.    Influenza A by PCR NEGATIVE NEGATIVE Final   Influenza B by PCR NEGATIVE NEGATIVE  Final    Comment: (NOTE) The Xpert Xpress SARS-CoV-2/FLU/RSV assay is intended as an aid in  the diagnosis of influenza from Nasopharyngeal swab specimens and  should not be used as a sole basis for treatment. Nasal washings and  aspirates are unacceptable for Xpert Xpress SARS-CoV-2/FLU/RSV  testing. Fact Sheet for Patients: PinkCheek.be Fact Sheet for Healthcare Providers: GravelBags.it This test is not yet approved or cleared by the [place] FDA and  has been authorized for detection and/or diagnosis of SARS-CoV-2 by  FDA under an Emergency Use Authorization (EUA). This EUA will remain  in effect (meaning this test can be used) for the duration of the  Covid-19 declaration under Section 564(b)(1) of the Act, 21  U.S.C. section 360bbb-3(b)(1), unless the authorization is  terminated or revoked. Performed at [facility], [address] [place], [place] [address]   Blood culture (routine x 2)     Status: None  Collection Time: 02/04/19  2:51 PM   Specimen: BLOOD  Result Value Ref Range Status   Specimen Description BLOOD BLOOD RIGHT HAND  Final   Special Requests   Final    BOTTLES DRAWN AEROBIC AND ANAEROBIC Blood Culture results may not be optimal due to an inadequate volume of blood received in culture bottles   Culture   Final    NO GROWTH 5 DAYS Performed at [facility], [address], [place], [place] [address]    Report Status 02/09/2019 FINAL  Final    Today   Subjective    Leslie Koch today has no headache,no chest abdominal pain,no new weakness tingling or numbness, feels much better wants to go home today.    Objective   Blood pressure 105/60, pulse 68, temperature 98.1 F (36.7 C), temperature source Oral, resp. rate 15, height '5\' 1"'$  (1.549 m), weight 91 kg, SpO2 92 %.  No intake or output data in the 24 hours ending 02/11/19 1013  Exam Awake Alert,   No new F.N  deficits, Normal affect [place].AT,PERRAL Supple Neck,No JVD, No cervical lymphadenopathy appriciated.  Symmetrical Chest wall movement, Good air movement bilaterally, CTAB RRR,No Gallops,Rubs or new Murmurs, No Parasternal Heave +ve B.Sounds, Abd Soft, Non tender, No organomegaly appriciated, No rebound -guarding or rigidity. No Cyanosis, Clubbing or edema, No new Rash or bruise   Data Review   CBC w Diff:  Lab Results  Component Value Date   WBC 9.2 02/10/2019   HGB 13.5 02/10/2019   HGB 13.7 07/22/2016   HCT 40.8 02/10/2019   HCT 40.7 07/22/2016   PLT 609 (H) 02/10/2019   PLT 332 07/22/2016   LYMPHOPCT 11 02/10/2019   MONOPCT 5 02/10/2019   EOSPCT 0 02/10/2019   BASOPCT 1 02/10/2019    CMP:  Lab Results  Component Value Date   NA 135 02/10/2019   NA 143 09/22/2016   NA 137 11/28/2013   K 3.8 02/10/2019   K 3.9 11/28/2013   CL 97 (L) 02/10/2019   CL 104 11/28/2013   CO2 26 02/10/2019   CO2 30 11/28/2013   BUN 20 02/10/2019   BUN 11 09/22/2016   BUN 9 11/28/2013   CREATININE 0.71 02/10/2019   CREATININE 0.73 11/28/2013   PROT 6.4 (L) 02/10/2019   PROT 6.5 01/23/2016   ALBUMIN 3.0 (L) 02/10/2019   ALBUMIN 3.9 01/23/2016   BILITOT 0.6 02/10/2019   BILITOT 0.3 01/23/2016   ALKPHOS 59 02/10/2019   AST 18 02/10/2019   ALT 29 02/10/2019  .   Total Time in preparing paper work, data evaluation and todays exam - 12 minutes  Lala Lund M.D on 02/11/2019 at 10:13 AM  [facility]   Office  [phone]

## 2019-02-11 NOTE — Plan of Care (Signed)
Plan of Care reviewed. 

## 2019-02-11 NOTE — Progress Notes (Signed)
Pt ambulated around room, approx 100 ft total.  O2 sats stayed 94-05% on room air.

## 2019-02-11 NOTE — Discharge Instructions (Signed)
Follow with Primary MD Mar Daring, [name] in 7 days   Get CBC, CMP, 2 view Chest X ray -  checked next visit within 1 week by Primary MD   Activity: As tolerated with Full fall precautions use walker/cane & assistance as needed  Disposition Home   Diet: Heart Healthy    Special Instructions: If you have smoked or chewed Tobacco  in the last 2 yrs please stop smoking, stop any regular Alcohol  and or any Recreational drug use.  On your next visit with your primary care physician please Get Medicines reviewed and adjusted.  Please request your Prim.MD to go over all Hospital Tests and Procedure/Radiological results at the follow up, please get all Hospital records sent to your Prim MD by signing hospital release before you go home.  If you experience worsening of your admission symptoms, develop shortness of breath, life threatening emergency, suicidal or homicidal thoughts you must seek medical attention immediately by calling 911 or calling your MD immediately  if symptoms less severe.  You Must read complete instructions/literature along with all the possible adverse reactions/side effects for all the Medicines you take and that have been prescribed to you. Take any new Medicines after you have completely understood and accpet all the possible adverse reactions/side effects.        Person Under Monitoring Name: Leslie Koch  Location: [place] [id number]   Infection Prevention Recommendations for Individuals Confirmed to have, or Being Evaluated for, 2019 Novel Coronavirus (COVID-19) Infection Who Receive Care at Home  Individuals who are confirmed to have, or are being evaluated for, COVID-19 should follow the prevention steps below until a healthcare provider or local or state health department says they can return to normal activities.  Stay home except to get medical care You should restrict activities outside your home, except for getting medical care.  Do not go to work, school, or public areas, and do not use public transportation or taxis.  Call ahead before visiting your doctor Before your medical appointment, call the healthcare provider and tell them that you have, or are being evaluated for, COVID-19 infection. This will help the healthcare provider's office take steps to keep other people from getting infected. Ask your healthcare provider to call the local or state health department.  Monitor your symptoms Seek prompt medical attention if your illness is worsening (e.g., difficulty breathing). Before going to your medical appointment, call the healthcare provider and tell them that you have, or are being evaluated for, COVID-19 infection. Ask your healthcare provider to call the local or state health department.  Wear a facemask You should wear a facemask that covers your nose and mouth when you are in the same room with other people and when you visit a healthcare provider. People who live with or visit you should also wear a facemask while they are in the same room with you.  Separate yourself from other people in your home As much as possible, you should stay in a different room from other people in your home. Also, you should use a separate bathroom, if available.  Avoid sharing household items You should not share dishes, drinking glasses, cups, eating utensils, towels, bedding, or other items with other people in your home. After using these items, you should wash them thoroughly with soap and water.  Cover your coughs and sneezes Cover your mouth and nose with a tissue when you cough or sneeze, or you can cough or  sneeze into your sleeve. Throw used tissues in a lined trash can, and immediately wash your hands with soap and water for at least 20 seconds or use an alcohol-based hand rub.  Wash your [facility] your hands often and thoroughly with soap and water for at least 20 seconds. You can use an alcohol-based  hand sanitizer if soap and water are not available and if your hands are not visibly dirty. Avoid touching your eyes, nose, and mouth with unwashed hands.   Prevention Steps for Caregivers and Household Members of Individuals Confirmed to have, or Being Evaluated for, COVID-19 Infection Being Cared for in the Home  If you live with, or provide care at home for, a person confirmed to have, or being evaluated for, COVID-19 infection please follow these guidelines to prevent infection:  Follow healthcare provider's instructions Make sure that you understand and can help the patient follow any healthcare provider instructions for all care.  Provide for the patient's basic needs You should help the patient with basic needs in the home and provide support for getting groceries, prescriptions, and other personal needs.  Monitor the patient's symptoms If they are getting sicker, call his or her medical provider and tell them that the patient has, or is being evaluated for, COVID-19 infection. This will help the healthcare provider's office take steps to keep other people from getting infected. Ask the healthcare provider to call the local or state health department.  Limit the number of people who have contact with the patient  If possible, have only one caregiver for the patient.  Other household members should stay in another home or place of residence. If this is not possible, they should stay  in another room, or be separated from the patient as much as possible. Use a separate bathroom, if available.  Restrict visitors who do not have an essential need to be in the home.  Keep older adults, very young children, and other sick people away from the patient Keep older adults, very young children, and those who have compromised immune systems or chronic health conditions away from the patient. This includes people with chronic heart, lung, or kidney conditions, diabetes, and  cancer.  Ensure good ventilation Make sure that shared spaces in the home have good air flow, such as from an air conditioner or an opened window, weather permitting.  Wash your hands often  Wash your hands often and thoroughly with soap and water for at least 20 seconds. You can use an alcohol based hand sanitizer if soap and water are not available and if your hands are not visibly dirty.  Avoid touching your eyes, nose, and mouth with unwashed hands.  Use disposable paper towels to dry your hands. If not available, use dedicated cloth towels and replace them when they become wet.  Wear a facemask and gloves  Wear a disposable facemask at all times in the room and gloves when you touch or have contact with the patient's blood, body fluids, and/or secretions or excretions, such as sweat, saliva, sputum, nasal mucus, vomit, urine, or feces.  Ensure the mask fits over your nose and mouth tightly, and do not touch it during use.  Throw out disposable facemasks and gloves after using them. Do not reuse.  Wash your hands immediately after removing your facemask and gloves.  If your personal clothing becomes contaminated, carefully remove clothing and launder. Wash your hands after handling contaminated clothing.  Place all used disposable facemasks, gloves, and other waste  in a lined container before disposing them with other household waste.  Remove gloves and wash your hands immediately after handling these items.  Do not share dishes, glasses, or other household items with the patient  Avoid sharing household items. You should not share dishes, drinking glasses, cups, eating utensils, towels, bedding, or other items with a patient who is confirmed to have, or being evaluated for, COVID-19 infection.  After the person uses these items, you should wash them thoroughly with soap and water.  Wash laundry thoroughly  Immediately remove and wash clothes or bedding that have blood, body  fluids, and/or secretions or excretions, such as sweat, saliva, sputum, nasal mucus, vomit, urine, or feces, on them.  Wear gloves when handling laundry from the patient.  Read and follow directions on labels of laundry or clothing items and detergent. In general, wash and dry with the warmest temperatures recommended on the label.  Clean all areas the individual has used often  Clean all touchable surfaces, such as counters, tabletops, doorknobs, bathroom fixtures, toilets, phones, keyboards, tablets, and bedside tables, every day. Also, clean any surfaces that may have blood, body fluids, and/or secretions or excretions on them.  Wear gloves when cleaning surfaces the patient has come in contact with.  Use a diluted bleach solution (e.g., dilute bleach with 1 part bleach and 10 parts water) or a household disinfectant with a label that says EPA-registered for coronaviruses. To make a bleach solution at home, add 1 tablespoon of bleach to 1 quart (4 cups) of water. For a larger supply, add  cup of bleach to 1 gallon (16 cups) of water.  Read labels of cleaning products and follow recommendations provided on product labels. Labels contain instructions for safe and effective use of the cleaning product including precautions you should take when applying the product, such as wearing gloves or eye protection and making sure you have good ventilation during use of the product.  Remove gloves and wash hands immediately after cleaning.  Monitor yourself for signs and symptoms of illness Caregivers and household members are considered close contacts, should monitor their health, and will be asked to limit movement outside of the home to the extent possible. Follow the monitoring steps for close contacts listed on the symptom monitoring form.   ? If you have additional questions, contact your local health department or call the epidemiologist on call at [phone] (available 24/7). ? This  guidance is subject to change. For the most up-to-date guidance from [facility], please refer to their website: [email or web address]

## 2019-02-12 ENCOUNTER — Telehealth: Payer: Self-pay

## 2019-02-12 NOTE — Telephone Encounter (Signed)
Patient was recently discharged from the hospital on 02/11/19.  No TCM completed, patient does not qualify for TCM services due to [employer] coverage.  Per discharge summary patient needs follow up with PCP. HFU scheduled 02/19/19 @ 11:20 AM.

## 2019-02-16 ENCOUNTER — Encounter: Payer: Self-pay | Admitting: Physician Assistant

## 2019-02-16 NOTE — Telephone Encounter (Signed)
Can we call to reschedule her til after 02/25/19 in office.

## 2019-02-16 NOTE — Progress Notes (Signed)
Patient: Leslie Koch Female    DOB: 07-24-1955   64 y.o.   MRN: [id number] Visit Date: 02/16/2019  Today's Provider: Mar Daring, [name]   No chief complaint on file.  Subjective:     Virtual Visit via Video Note  I connected with Leslie Koch on 02/19/19 at  8:20 AM EST by a video enabled telemedicine application and verified that I am speaking with the correct person using two identifiers.  Location: Patient: Home Provider: [facility]   I discussed the limitations of evaluation and management by telemedicine and the availability of in person appointments. The patient expressed understanding and agreed to proceed.    HPI   Follow up Hospitalization  Patient was admitted to [facility] on 02/04/2019 and discharged on 02/06/2019. Then transferred to [facility] for worsening hypoxemia on 10 L of oxygen via [facility]NC on 02/06/2019 and discharged 02/11/2019. She was treated for acute respiratory failure with hypoxia from Covid 19 infection. Treatment for this included Steroids: 1/24> Remdesivir: 1/24>> 1/28 Actemra: 1/25 x 1, 1/26 x1 Plasma: 1/26 x1  Telephone follow up was done on 02/12/2019 She reports excellent compliance with treatment. She reports this condition is Improved.  ------------------------------------------------------------------------------------     Allergies  Allergen Reactions  . Augmentin [Amoxicillin-Pot Clavulanate]     diarrhea  . Montelukast Sodium Rash     Current Outpatient Medications:  .  acetaminophen (TYLENOL) 325 MG tablet, Take 2 tablets (650 mg total) by mouth every 6 (six) hours as needed for mild pain or headache (fever >/= 101)., Disp: 10 tablet, Rfl: 0 .  albuterol (VENTOLIN HFA) 108 (90 Base) MCG/ACT inhaler, Inhale 2 puffs into the lungs every 6 (six) hours as needed for wheezing or shortness of breath., Disp: 6.7 g, Rfl: 0 .  ascorbic acid (VITAMIN C) 500 MG tablet, Take 1 tablet (500 mg total) by mouth daily., Disp: 30 tablet, Rfl: 0 .   aspirin EC 81 MG tablet, Take 81 mg by mouth daily. , Disp: , Rfl:  .  Calcium Carbonate-Vitamin D (CALCIUM 500/D PO), Take 1 tablet by mouth daily. , Disp: , Rfl:  .  cetirizine (ZYRTEC ALLERGY) 10 MG tablet, Take 10 mg by mouth daily. , Disp: , Rfl:  .  chlorpheniramine-HYDROcodone (TUSSIONEX) 10-8 MG/5ML SUER, Take 5 mLs by mouth every 12 (twelve) hours as needed for cough., Disp: 140 mL, Rfl: 0 .  Cholecalciferol (VITAMIN D3) 1000 units CAPS, Take 1,000 Units by mouth daily. , Disp: , Rfl:  .  estradiol (ESTRACE) 1 MG tablet, TAKE 1 TABLET BY MOUTH  DAILY (Patient taking differently: Take 1 mg by mouth daily. ), Disp: 90 tablet, Rfl: 3 .  furosemide (LASIX) 40 MG tablet, Take 1 tablet (40 mg total) by mouth 2 (two) times daily as needed. (Patient taking differently: Take 20 mg by mouth daily. ), Disp: 180 tablet, Rfl: 0 .  Glucosamine-Chondroitin 750-600 MG TABS, Take 1 tablet by mouth as directed. , Disp: , Rfl:  .  guaiFENesin-dextromethorphan (ROBITUSSIN DM) 100-10 MG/5ML syrup, Take 10 mLs by mouth every 4 (four) hours as needed for cough., Disp: 118 mL, Rfl: 0 .  Ipratropium-Albuterol (COMBIVENT) 20-100 MCG/ACT AERS respimat, Inhale 1 puff into the lungs every 6 (six) hours., Disp: 4 g, Rfl: 0 .  Multiple Vitamin (MULTIVITAMIN) tablet, Take 1 tablet by mouth daily. , Disp: , Rfl:  .  Omega-3 1000 MG CAPS, Take 2,000 mg by mouth daily., Disp: , Rfl:  .  oxymetazoline (  AFRIN) 0.05 % nasal spray, Place 1-2 sprays into both nostrils 2 (two) times daily as needed for congestion., Disp: , Rfl:  .  pantoprazole (PROTONIX) 40 MG tablet, Take 1 tablet (40 mg total) by mouth daily., Disp: 10 tablet, Rfl: 0 .  polyethylene glycol (MIRALAX / GLYCOLAX) 17 g packet, Take 17 g by mouth daily as needed for mild constipation., Disp: 14 each, Rfl: 0 .  potassium chloride (K-DUR) 10 MEQ tablet, Take 1 tablet (10 mEq total) by mouth 2 (two) times daily as needed. (Patient taking differently: Take 10 mEq by  mouth daily. ), Disp: 60 tablet, Rfl: 4 .  senna-docusate (SENOKOT-S) 8.6-50 MG tablet, Take 1 tablet by mouth at bedtime., Disp: , Rfl:  .  simvastatin (ZOCOR) 40 MG tablet, Take 1 tablet (40 mg total) by mouth at bedtime., Disp: 90 tablet, Rfl: 3 .  valACYclovir (VALTREX) 1000 MG tablet, Take 1-2 tablets (1,000-2,000 mg total) by mouth every 12 (twelve) hours as needed., Disp: 28 tablet, Rfl: 3 .  zinc sulfate 220 (50 Zn) MG capsule, Take 1 capsule (220 mg total) by mouth daily., Disp: 30 capsule, Rfl: 0  Review of Systems  Constitutional: Positive for fatigue. Negative for appetite change, chills, diaphoresis and fever.  HENT: Positive for sinus pressure. Negative for congestion, ear pain, postnasal drip, rhinorrhea, sinus pain and sore throat.   Eyes: Positive for visual disturbance (blurred).  Respiratory: Positive for cough. Negative for chest tightness, shortness of breath and wheezing.   Cardiovascular: Negative for chest pain, palpitations and leg swelling.  Gastrointestinal: Negative for abdominal pain and nausea.  Neurological: Negative for dizziness and headaches.    Social History   Tobacco Use  . Smoking status: Former Smoker    Packs/day: 2.00    Years: 30.00    Pack years: 60.00    Types: Cigarettes    Quit date: 04/14/1995    Years since quitting: 23.8  . Smokeless tobacco: Never Used  . Tobacco comment: quit 04/14/1995  Substance Use Topics  . Alcohol use: Yes    Alcohol/week: 0.0 standard drinks    Comment: occasionally 2-3 drinks once or twice a month      Objective:   There were no vitals taken for this visit. There were no vitals filed for this visit.There is no height or weight on file to calculate BMI.   Physical Exam Vitals reviewed.  Constitutional:      Appearance: Normal appearance. She is well-developed. She is not ill-appearing.  HENT:     Head: Normocephalic and atraumatic.  Pulmonary:     Effort: Pulmonary effort is normal. No respiratory  distress (able to talk in full sentences; occasional dry cough or throat clearing).  Musculoskeletal:     Cervical back: Normal range of motion and neck supple.  Neurological:     Mental Status: She is alert.  Psychiatric:        Mood and Affect: Mood normal.        Behavior: Behavior normal.        Thought Content: Thought content normal.        Judgment: Judgment normal.      No results found for any visits on 02/19/19.     Assessment & Plan     1. Encounter for support and coordination of transition of care Hospital notes, labs and images from [facility] admissions from 02/04/19 - 02/11/19 were reviewed prior to patient appointment.   2. Pneumonia due to COVID-19 virus Patient does  report overall improvements but jsut yesterday started feeling a little run down again, noticing increasing sinus congestion and chest congestion. Will treat for possible secondary bacterial pneumonia as below with Doxycycline since she has had classic second sickening signs. Call if worsening. If not I will see her back on 03/01/19 for labs and repeat CXR.  - doxycycline (VIBRA-TABS) 100 MG tablet; Take 1 tablet (100 mg total) by mouth 2 (two) times daily.  Dispense: 20 tablet; Refill: 0   I discussed the assessment and treatment plan with the patient. The patient was provided an opportunity to ask questions and all were answered. The patient agreed with the plan and demonstrated an understanding of the instructions.   The patient was advised to call back or seek an in-person evaluation if the symptoms worsen or if the condition fails to improve as anticipated.  I provided 18 minutes of non-face-to-face time during this encounter.    Mar Daring, [name]  [facility]

## 2019-02-19 ENCOUNTER — Other Ambulatory Visit: Payer: Self-pay

## 2019-02-19 ENCOUNTER — Telehealth: Payer: BC Managed Care – PPO | Admitting: Physician Assistant

## 2019-02-19 ENCOUNTER — Telehealth (INDEPENDENT_AMBULATORY_CARE_PROVIDER_SITE_OTHER): Payer: BC Managed Care – PPO | Admitting: Physician Assistant

## 2019-02-19 ENCOUNTER — Encounter: Payer: Self-pay | Admitting: Physician Assistant

## 2019-02-19 DIAGNOSIS — J1282 Pneumonia due to coronavirus disease 2019: Secondary | ICD-10-CM | POA: Diagnosis not present

## 2019-02-19 DIAGNOSIS — U071 COVID-19: Secondary | ICD-10-CM | POA: Diagnosis not present

## 2019-02-19 DIAGNOSIS — Z7689 Persons encountering health services in other specified circumstances: Secondary | ICD-10-CM | POA: Diagnosis not present

## 2019-02-19 MED ORDER — DOXYCYCLINE HYCLATE 100 MG PO TABS: 100.0000 mg | ORAL_TABLET | Freq: Two times a day (BID) | ORAL | 0 refills | Status: DC

## 2019-02-26 NOTE — Progress Notes (Deleted)
Patient: Leslie Koch Female    DOB: May 06, 1955   64 y.o.   MRN: [id number] Visit Date: 02/26/2019  Today's Provider: Mar Daring, [name]   No chief complaint on file.  Subjective:     HPI  Pneumonia due to COVID-19 virus Patient was seen 1 week ago she reportedoverall improvements but had started feeling a little run down again, noticing increasing sinus congestion and chest congestion. Treated for possible secondary bacterial pneumonia with Doxycycline since she has had classic second sickening signs.she will need labs and repeat CXR.    Allergies  Allergen Reactions  . Augmentin [Amoxicillin-Pot Clavulanate]     diarrhea  . Montelukast Sodium Rash     Current Outpatient Medications:  .  albuterol (VENTOLIN HFA) 108 (90 Base) MCG/ACT inhaler, Inhale 2 puffs into the lungs every 6 (six) hours as needed for wheezing or shortness of breath., Disp: 6.7 g, Rfl: 0 .  ascorbic acid (VITAMIN C) 500 MG tablet, Take 1 tablet (500 mg total) by mouth daily., Disp: 30 tablet, Rfl: 0 .  aspirin EC 81 MG tablet, Take 81 mg by mouth daily. , Disp: , Rfl:  .  Calcium Carbonate-Vitamin D (CALCIUM 500/D PO), Take 1 tablet by mouth daily. , Disp: , Rfl:  .  cetirizine (ZYRTEC ALLERGY) 10 MG tablet, Take 10 mg by mouth daily. , Disp: , Rfl:  .  Cholecalciferol (VITAMIN D3) 1000 units CAPS, Take 1,000 Units by mouth daily. , Disp: , Rfl:  .  doxycycline (VIBRA-TABS) 100 MG tablet, Take 1 tablet (100 mg total) by mouth 2 (two) times daily., Disp: 20 tablet, Rfl: 0 .  estradiol (ESTRACE) 1 MG tablet, TAKE 1 TABLET BY MOUTH  DAILY (Patient taking differently: Take 1 mg by mouth daily. ), Disp: 90 tablet, Rfl: 3 .  furosemide (LASIX) 40 MG tablet, Take 1 tablet (40 mg total) by mouth 2 (two) times daily as needed. (Patient taking differently: Take 20 mg by mouth daily. ), Disp: 180 tablet, Rfl: 0 .  Glucosamine-Chondroitin 750-600 MG TABS, Take 1 tablet by mouth as directed. , Disp: , Rfl:    .  Ipratropium-Albuterol (COMBIVENT) 20-100 MCG/ACT AERS respimat, Inhale 1 puff into the lungs every 6 (six) hours., Disp: 4 g, Rfl: 0 .  Multiple Vitamin (MULTIVITAMIN) tablet, Take 1 tablet by mouth daily. , Disp: , Rfl:  .  Omega-3 1000 MG CAPS, Take 2,000 mg by mouth daily., Disp: , Rfl:  .  oxymetazoline (AFRIN) 0.05 % nasal spray, Place 1-2 sprays into both nostrils 2 (two) times daily as needed for congestion., Disp: , Rfl:  .  polyethylene glycol (MIRALAX / GLYCOLAX) 17 g packet, Take 17 g by mouth daily as needed for mild constipation., Disp: 14 each, Rfl: 0 .  potassium chloride (K-DUR) 10 MEQ tablet, Take 1 tablet (10 mEq total) by mouth 2 (two) times daily as needed. (Patient taking differently: Take 10 mEq by mouth daily. ), Disp: 60 tablet, Rfl: 4 .  senna-docusate (SENOKOT-S) 8.6-50 MG tablet, Take 1 tablet by mouth at bedtime., Disp: , Rfl:  .  simvastatin (ZOCOR) 40 MG tablet, Take 1 tablet (40 mg total) by mouth at bedtime., Disp: 90 tablet, Rfl: 3 .  valACYclovir (VALTREX) 1000 MG tablet, Take 1-2 tablets (1,000-2,000 mg total) by mouth every 12 (twelve) hours as needed., Disp: 28 tablet, Rfl: 3 .  zinc sulfate 220 (50 Zn) MG capsule, Take 1 capsule (220 mg total) by mouth daily., Disp:  30 capsule, Rfl: 0  Review of Systems  Social History   Tobacco Use  . Smoking status: Former Smoker    Packs/day: 2.00    Years: 30.00    Pack years: 60.00    Types: Cigarettes    Quit date: 04/14/1995    Years since quitting: 23.8  . Smokeless tobacco: Never Used  . Tobacco comment: quit 04/14/1995  Substance Use Topics  . Alcohol use: Yes    Alcohol/week: 0.0 standard drinks    Comment: occasionally 2-3 drinks once or twice a month      Objective:   There were no vitals taken for this visit. There were no vitals filed for this visit.There is no height or weight on file to calculate BMI.   Physical Exam   No results found for any visits on 03/01/19.     Assessment &  [place], [name]  [facility]

## 2019-03-01 ENCOUNTER — Ambulatory Visit: Payer: Self-pay | Admitting: Physician Assistant

## 2019-03-08 ENCOUNTER — Encounter: Payer: Self-pay | Admitting: Physician Assistant

## 2019-03-08 ENCOUNTER — Other Ambulatory Visit: Payer: Self-pay

## 2019-03-08 ENCOUNTER — Ambulatory Visit
Admission: RE | Admit: 2019-03-08 | Discharge: 2019-03-08 | Disposition: A | Discharge status: Home / Self Care | Payer: BC Managed Care – PPO | Source: Ambulatory Visit | Attending: Physician Assistant | Admitting: Physician Assistant

## 2019-03-08 ENCOUNTER — Ambulatory Visit (INDEPENDENT_AMBULATORY_CARE_PROVIDER_SITE_OTHER): Payer: BC Managed Care – PPO | Admitting: Physician Assistant

## 2019-03-08 VITALS — BP 115/80 | HR 88 | Temp 96.8°F | Wt 226.0 lb

## 2019-03-08 DIAGNOSIS — J1282 Pneumonia due to coronavirus disease 2019: Secondary | ICD-10-CM | POA: Diagnosis not present

## 2019-03-08 DIAGNOSIS — U071 COVID-19: Secondary | ICD-10-CM

## 2019-03-08 DIAGNOSIS — J069 Acute upper respiratory infection, unspecified: Secondary | ICD-10-CM | POA: Insufficient documentation

## 2019-03-08 NOTE — Progress Notes (Signed)
Patient: Leslie Koch Female    DOB: May 01, 1955   64 y.o.   MRN: [id number] Visit Date: 03/08/2019  Today's Provider: Mar Daring, [name]   No chief complaint on file.  Subjective:     HPI    Follow up Hospitalization  Patient was admitted to [facility] on 02/04/19 and transferred to [facility] on 02/06/2019 and discharged on 02/11/2019. She was treated for Covid -19 Pneumonia and Acute resp failure due to covid. Treatment for this included Remdesivir, Actemra, decadron, and one dose of convalescent plasma on 02/06/19. Was also placed up to 10 L on HFNC.  Telephone follow up was done on 02/12/19. She reports excellent compliance with treatment. She reports this condition is Improved.  Pt reports feeling better but is still not to baseline.  Still not full-time yet for work. Has some DOE, fatigue and brain fog.  Work has been compliant and very supportive to her.  ------------------------------------------------------------------------------------     Allergies  Allergen Reactions  . Augmentin [Amoxicillin-Pot Clavulanate]     diarrhea  . Montelukast Sodium Rash     Current Outpatient Medications:  .  ascorbic acid (VITAMIN C) 500 MG tablet, Take 1 tablet (500 mg total) by mouth daily., Disp: 30 tablet, Rfl: 0 .  aspirin EC 81 MG tablet, Take 81 mg by mouth daily. , Disp: , Rfl:  .  Calcium Carbonate-Vitamin D (CALCIUM 500/D PO), Take 1 tablet by mouth daily. , Disp: , Rfl:  .  cetirizine (ZYRTEC ALLERGY) 10 MG tablet, Take 10 mg by mouth daily. , Disp: , Rfl:  .  Cholecalciferol (VITAMIN D3) 1000 units CAPS, Take 1,000 Units by mouth daily. , Disp: , Rfl:  .  estradiol (ESTRACE) 1 MG tablet, TAKE 1 TABLET BY MOUTH  DAILY (Patient taking differently: Take 1 mg by mouth daily. ), Disp: 90 tablet, Rfl: 3 .  fluticasone (FLONASE) 50 MCG/ACT nasal spray, Place 2 sprays into both nostrils daily., Disp: , Rfl:  .  furosemide (LASIX) 40 MG tablet, Take 1 tablet (40 mg  total) by mouth 2 (two) times daily as needed. (Patient taking differently: Take 20 mg by mouth daily. ), Disp: 180 tablet, Rfl: 0 .  Glucosamine-Chondroitin 750-600 MG TABS, Take 1 tablet by mouth as directed. , Disp: , Rfl:  .  Ipratropium-Albuterol (COMBIVENT) 20-100 MCG/ACT AERS respimat, Inhale 1 puff into the lungs every 6 (six) hours., Disp: 4 g, Rfl: 0 .  lisinopril-hydrochlorothiazide (ZESTORETIC) 20-12.5 MG tablet, Take 1 tablet by mouth daily., Disp: , Rfl:  .  Multiple Vitamin (MULTIVITAMIN) tablet, Take 1 tablet by mouth daily. , Disp: , Rfl:  .  Omega-3 1000 MG CAPS, Take 2,000 mg by mouth daily., Disp: , Rfl:  .  oxymetazoline (AFRIN) 0.05 % nasal spray, Place 1-2 sprays into both nostrils 2 (two) times daily as needed for congestion., Disp: , Rfl:  .  potassium chloride (K-DUR) 10 MEQ tablet, Take 1 tablet (10 mEq total) by mouth 2 (two) times daily as needed. (Patient taking differently: Take 10 mEq by mouth daily. ), Disp: 60 tablet, Rfl: 4 .  senna-docusate (SENOKOT-S) 8.6-50 MG tablet, Take 1 tablet by mouth at bedtime., Disp: , Rfl:  .  simvastatin (ZOCOR) 40 MG tablet, Take 1 tablet (40 mg total) by mouth at bedtime., Disp: 90 tablet, Rfl: 3 .  valACYclovir (VALTREX) 1000 MG tablet, Take 1-2 tablets (1,000-2,000 mg total) by mouth every 12 (twelve) hours as needed., Disp: 28 tablet, Rfl:  3 .  zinc sulfate 220 (50 Zn) MG capsule, Take 1 capsule (220 mg total) by mouth daily., Disp: 30 capsule, Rfl: 0 .  albuterol (VENTOLIN HFA) 108 (90 Base) MCG/ACT inhaler, Inhale 2 puffs into the lungs every 6 (six) hours as needed for wheezing or shortness of breath. (Patient not taking: Reported on 03/08/2019), Disp: 6.7 g, Rfl: 0 .  doxycycline (VIBRA-TABS) 100 MG tablet, Take 1 tablet (100 mg total) by mouth 2 (two) times daily., Disp: 20 tablet, Rfl: 0 .  polyethylene glycol (MIRALAX / GLYCOLAX) 17 g packet, Take 17 g by mouth daily as needed for mild constipation., Disp: 14 each, Rfl:  0  Review of Systems  Constitutional: Positive for fatigue.  Respiratory: Positive for cough and shortness of breath. Negative for apnea, choking, chest tightness, wheezing and stridor.   Cardiovascular: Positive for leg swelling. Negative for chest pain and palpitations.  Gastrointestinal: Negative.   Neurological: Negative for dizziness, light-headedness and headaches.    Social History   Tobacco Use  . Smoking status: Former Smoker    Packs/day: 2.00    Years: 30.00    Pack years: 60.00    Types: Cigarettes    Quit date: 04/14/1995    Years since quitting: 23.9  . Smokeless tobacco: Never Used  . Tobacco comment: quit 04/14/1995  Substance Use Topics  . Alcohol use: Yes    Alcohol/week: 0.0 standard drinks    Comment: occasionally 2-3 drinks once or twice a month      Objective:   BP 115/80 (BP Location: Right Arm, Patient Position: Sitting, Cuff Size: Large)   Pulse 88   Temp (!) 96.8 F (36 C) (Temporal)   Wt 226 lb (102.5 kg)   SpO2 98%   BMI 42.70 kg/m  Vitals:   03/08/19 1341  BP: 115/80  Pulse: 88  Temp: (!) 96.8 F (36 C)  TempSrc: Temporal  SpO2: 98%  Weight: 226 lb (102.5 kg)  Body mass index is 42.7 kg/m.   Physical Exam Vitals reviewed.  Constitutional:      General: She is not in acute distress.    Appearance: Normal appearance. She is well-developed. She is obese. She is not ill-appearing or diaphoretic.  Cardiovascular:     Rate and Rhythm: Normal rate and regular rhythm.     Pulses: Normal pulses.     Heart sounds: Normal heart sounds. No murmur. No friction rub. No gallop.   Pulmonary:     Effort: Pulmonary effort is normal. No respiratory distress.     Breath sounds: Normal breath sounds. No wheezing or rales.  Musculoskeletal:     Cervical back: Normal range of motion and neck supple.  Neurological:     Mental Status: She is alert.  Psychiatric:        Mood and Affect: Mood normal.        Behavior: Behavior normal.         Judgment: Judgment normal.      No results found for any visits on 03/08/19.     Assessment & Plan    1. Pneumonia due to COVID-19 virus Slowly improving. Will check labs and CXR. Will f/u pending results. Continue to allow rest and slowly increase normal daily activities with work as tolerated. Had 1st Covid 19 vaccine on 01/22/19, but had convalescent plasma on 02/06/19. Will need to wait 90 days before 2nd shot. Per CDC guidelines, outside of 42 days between shots has only been studied in a limited capacity but  that it is not recommended at this time to repeat the series.  - CBC w/Diff/Platelet - Comprehensive Metabolic Panel (CMET) - DG Chest 2 View; Future  2. Acute respiratory disease due to 2019 novel coronavirus See above medical treatment plan. - CBC w/Diff/Platelet - Comprehensive Metabolic Panel (CMET) - DG Chest 2 View; Future     Mar Daring, [name]  [facility]

## 2019-03-09 ENCOUNTER — Other Ambulatory Visit: Payer: Self-pay | Admitting: Physician Assistant

## 2019-03-09 ENCOUNTER — Telehealth: Payer: Self-pay

## 2019-03-09 DIAGNOSIS — E7800 Pure hypercholesterolemia, unspecified: Secondary | ICD-10-CM

## 2019-03-09 LAB — COMPREHENSIVE METABOLIC PANEL
ALT: 25 IU/L (ref 0–32)
AST: 25 IU/L (ref 0–40)
Albumin/Globulin Ratio: 2 (ref 1.2–2.2)
Albumin: 4.1 g/dL (ref 3.8–4.8)
Alkaline Phosphatase: 50 IU/L (ref 39–117)
BUN/Creatinine Ratio: 25 (ref 12–28)
BUN: 20 mg/dL (ref 8–27)
Bilirubin Total: 0.3 mg/dL (ref 0.0–1.2)
CO2: 18 mmol/L — ABNORMAL LOW (ref 20–29)
Calcium: 9.3 mg/dL (ref 8.7–10.3)
Chloride: 103 mmol/L (ref 96–106)
Creatinine, Ser: 0.81 mg/dL (ref 0.57–1.00)
GFR calc Af Amer: 90 mL/min/{1.73_m2} (ref >59)
GFR calc non Af Amer: 78 mL/min/{1.73_m2} (ref >59)
Globulin, Total: 2.1 g/dL (ref 1.5–4.5)
Glucose: 132 mg/dL — ABNORMAL HIGH (ref 65–99)
Potassium: 3.5 mmol/L (ref 3.5–5.2)
Sodium: 143 mmol/L (ref 134–144)
Total Protein: 6.2 g/dL (ref 6.0–8.5)

## 2019-03-09 LAB — CBC WITH DIFFERENTIAL/PLATELET
Basophils Absolute: 0.1 10*3/uL (ref 0.0–0.2)
Basos: 1 %
EOS (ABSOLUTE): 0.1 10*3/uL (ref 0.0–0.4)
Eos: 2 %
Hematocrit: 40.8 % (ref 34.0–46.6)
Hemoglobin: 14.1 g/dL (ref 11.1–15.9)
Immature Grans (Abs): 0 10*3/uL (ref 0.0–0.1)
Immature Granulocytes: 0 %
Lymphocytes Absolute: 2.8 10*3/uL (ref 0.7–3.1)
Lymphs: 43 %
MCH: 32.9 pg (ref 26.6–33.0)
MCHC: 34.6 g/dL (ref 31.5–35.7)
MCV: 95 fL (ref 79–97)
Monocytes Absolute: 0.6 10*3/uL (ref 0.1–0.9)
Monocytes: 9 %
Neutrophils Absolute: 3 10*3/uL (ref 1.4–7.0)
Neutrophils: 45 %
Platelets: 260 10*3/uL (ref 150–450)
RBC: 4.29 x10E6/uL (ref 3.77–5.28)
RDW: 14.8 % (ref 11.7–15.4)
WBC: 6.6 10*3/uL (ref 3.4–10.8)

## 2019-03-09 NOTE — Telephone Encounter (Signed)
Requested medications are due for refill today?  Yes - Simvastatin failed due to no lipid panel per protocol.   Lisinopril - HCTZ  is a historical med  Requested medications are on active medication list?  Yes  Last Refill:   Simvastatin - 04/24/2018 # 90 with 3 refills Lisinopril - HCTZ - historical med  Future visit scheduled? No  Notes to Clinic:

## 2019-03-09 NOTE — Telephone Encounter (Signed)
Patient advised as below.  

## 2019-03-09 NOTE — Telephone Encounter (Signed)
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 Some people have improvement in the scarring after the 1st 3 months, some it is permanent.

## 2019-03-09 NOTE — Telephone Encounter (Signed)
-----   Message from Mar Daring, [name] sent at 03/09/2019  3:27 PM EST ----- All labs have improved as expected.

## 2019-03-11 DIAGNOSIS — U071 COVID-19: Secondary | ICD-10-CM | POA: Diagnosis not present

## 2019-03-28 DIAGNOSIS — Z96651 Presence of right artificial knee joint: Secondary | ICD-10-CM | POA: Diagnosis not present

## 2019-03-28 DIAGNOSIS — Z471 Aftercare following joint replacement surgery: Secondary | ICD-10-CM | POA: Diagnosis not present

## 2019-03-28 DIAGNOSIS — M1712 Unilateral primary osteoarthritis, left knee: Secondary | ICD-10-CM | POA: Diagnosis not present

## 2019-03-28 DIAGNOSIS — M25562 Pain in left knee: Secondary | ICD-10-CM | POA: Diagnosis not present

## 2019-04-05 ENCOUNTER — Other Ambulatory Visit: Payer: Self-pay

## 2019-04-10 DIAGNOSIS — U071 COVID-19: Secondary | ICD-10-CM | POA: Diagnosis not present

## 2019-05-11 DIAGNOSIS — U071 COVID-19: Secondary | ICD-10-CM | POA: Diagnosis not present

## 2019-06-10 DIAGNOSIS — U071 COVID-19: Secondary | ICD-10-CM | POA: Diagnosis not present

## 2019-06-11 ENCOUNTER — Other Ambulatory Visit: Payer: Self-pay | Admitting: Physician Assistant

## 2019-06-11 DIAGNOSIS — R600 Localized edema: Secondary | ICD-10-CM

## 2019-07-23 ENCOUNTER — Other Ambulatory Visit: Payer: Self-pay | Admitting: Physician Assistant

## 2019-07-23 DIAGNOSIS — R232 Flushing: Secondary | ICD-10-CM

## 2019-07-23 MED ORDER — ESTRADIOL 1 MG PO TABS: 1.0000 mg | ORAL_TABLET | Freq: Every day | ORAL | 1 refills | Status: DC

## 2019-07-23 MED ORDER — LISINOPRIL-HYDROCHLOROTHIAZIDE 20-12.5 MG PO TABS: 1.0000 | ORAL_TABLET | Freq: Every day | ORAL | 1 refills | Status: DC

## 2019-07-23 NOTE — Telephone Encounter (Signed)
Medication Refill - Medication: lisinopril-hydrochlorothiazide (ZESTORETIC) 20-12.5 MG tablet   celecoxib (CELEBREX) 200 MG capsule  estradiol (ESTRACE) 1 MG tablet   Has the patient contacted their pharmacy? No. (Agent: If no, request that the patient contact the pharmacy for the refill.) (Agent: If yes, when and what did the pharmacy advise?)  Preferred Pharmacy (with phone number or street name):  [id number] - [place], [address] Phone:  [phone]  Fax:  [phone]       Agent: Please be advised that RX refills may take up to 3 business days. We ask that you follow-up with your pharmacy.

## 2019-07-23 NOTE — Telephone Encounter (Signed)
Requested medication (s) are due for refill today: yes  Requested medication (s) are on the active medication list: yes  Last refill:  lisinopril-HCTZ: 03/09/19     //Estrace: 08/13/18  Future visit scheduled: no  Notes to clinic:  Pt requesting 1 year supply for both meds Last mammogram 06/16/17   Pt also requesting Celebrex, see encounter not an active med   Requested Prescriptions  Pending Prescriptions Disp Refills   lisinopril-hydrochlorothiazide (ZESTORETIC) 20-12.5 MG tablet 90 tablet 1    Sig: Take 1 tablet by mouth daily.      Cardiovascular:  ACEI + Diuretic Combos Passed - 07/23/2019 10:34 AM      Passed - Na in normal range and within 180 days    Sodium  Date Value Ref Range Status  03/08/2019 143 134 - 144 mmol/L Final  11/28/2013 137 136 - 145 mmol/L Final          Passed - K in normal range and within 180 days    Potassium  Date Value Ref Range Status  03/08/2019 3.5 3.5 - 5.2 mmol/L Final  11/28/2013 3.9 3.5 - 5.1 mmol/L Final          Passed - Cr in normal range and within 180 days    Creatinine  Date Value Ref Range Status  11/28/2013 0.73 0.60 - 1.30 mg/dL Final   Creatinine, Ser  Date Value Ref Range Status  03/08/2019 0.81 0.57 - 1.00 mg/dL Final          Passed - Ca in normal range and within 180 days    Calcium  Date Value Ref Range Status  03/08/2019 9.3 8.7 - 10.3 mg/dL Final   Calcium, Total  Date Value Ref Range Status  11/28/2013 8.0 (L) 8.5 - 10.1 mg/dL Final          Passed - Patient is not pregnant      Passed - Last BP in normal range    BP Readings from Last 1 Encounters:  03/08/19 115/80          Passed - Valid encounter within last 6 months    Recent Outpatient Visits           4 months ago Pneumonia due to COVID-19 virus   [facility] [place], [name], [name]   5 months ago Encounter for support and coordination of transition of care   [facility] [place], [place], [name]   5  months ago Acute viral syndrome   [place], [place], [name]   2 years ago Acute viral syndrome   [place], [place], [name]   2 years ago Herpes zoster without complication   [facility], [place] Malta [place], [name]                estradiol (ESTRACE) 1 MG tablet 90 tablet 3    Sig: Take 1 tablet (1 mg total) by mouth daily.      OB/GYN:  Estrogens Failed - 07/23/2019 10:34 AM      Failed - Mammogram is up-to-date per Health Maintenance      Passed - Last BP in normal range    BP Readings from Last 1 Encounters:  03/08/19 115/80          Passed - Valid encounter within last 12 months    Recent Outpatient Visits           4 months ago Pneumonia due to COVID-19 virus  [place], [name]   5 months ago Encounter for support and coordination of transition of care   [place], [name]   5 months ago Acute viral syndrome   [place], [place], [name]   2 years ago Acute viral syndrome   [place], [name]   2 years ago Herpes zoster without complication   [facility], [place], [name]

## 2019-07-23 NOTE — Telephone Encounter (Signed)
Celecoxib is a historic med that was discontinued on 02/06/19 by Dr. Clementeen Graham "stop taking at discharge. Pt is requesting a reorder. Routing request to office.

## 2019-07-30 ENCOUNTER — Telehealth: Payer: Self-pay | Admitting: Physician Assistant

## 2019-07-30 DIAGNOSIS — M2550 Pain in unspecified joint: Secondary | ICD-10-CM

## 2019-07-30 MED ORDER — CELECOXIB 200 MG PO CAPS: 200.0000 mg | ORAL_CAPSULE | Freq: Every day | ORAL | 1 refills | Status: DC

## 2019-07-30 NOTE — Telephone Encounter (Signed)
Patient calling to request a refill on celecoxib (CELEBREX) 200 MG capsule [id number] [place], [place] - [address] Phone:  [phone]  Fax:  [phone]

## 2019-07-30 NOTE — Telephone Encounter (Signed)
[facility]

## 2019-07-30 NOTE — Telephone Encounter (Signed)
Review for refill  Not on current medication list

## 2019-08-29 ENCOUNTER — Other Ambulatory Visit: Payer: Self-pay | Admitting: Physician Assistant

## 2019-08-29 DIAGNOSIS — R600 Localized edema: Secondary | ICD-10-CM

## 2019-08-29 NOTE — Telephone Encounter (Signed)
Requested Prescriptions  Pending Prescriptions Disp Refills   furosemide (LASIX) 40 MG tablet [Pharmacy Med Name: FUROSEMIDE 40 MG TABLET] 180 tablet 0    Sig: TAKE 1 TABLET BY MOUTH TWICE A DAY AS NEEDED     Cardiovascular:  Diuretics - Loop Passed - 08/29/2019  6:52 PM      Passed - K in normal range and within 360 days    Potassium  Date Value Ref Range Status  03/08/2019 3.5 3.5 - 5.2 mmol/L Final  11/28/2013 3.9 3.5 - 5.1 mmol/L Final         Passed - Ca in normal range and within 360 days    Calcium  Date Value Ref Range Status  03/08/2019 9.3 8.7 - 10.3 mg/dL Final   Calcium, Total  Date Value Ref Range Status  11/28/2013 8.0 (L) 8.5 - 10.1 mg/dL Final         Passed - Na in normal range and within 360 days    Sodium  Date Value Ref Range Status  03/08/2019 143 134 - 144 mmol/L Final  11/28/2013 137 136 - 145 mmol/L Final         Passed - Cr in normal range and within 360 days    Creatinine  Date Value Ref Range Status  11/28/2013 0.73 0.60 - 1.30 mg/dL Final   Creatinine, Ser  Date Value Ref Range Status  03/08/2019 0.81 0.57 - 1.00 mg/dL Final         Passed - Last BP in normal range    BP Readings from Last 1 Encounters:  03/08/19 115/80         Passed - Valid encounter within last 6 months    Recent Outpatient Visits          5 months ago Pneumonia due to COVID-19 virus   [name], [name], [name]   6 months ago Encounter for support and coordination of transition of care   [name], [name]   6 months ago Acute viral syndrome   [name], [name], [name]   2 years ago Acute viral syndrome   [name], [name]   2 years ago Herpes zoster without complication   [name], [name], [name]             Reminder for patient to schedule 6 month follow-up.

## 2019-11-19 ENCOUNTER — Other Ambulatory Visit: Payer: Self-pay | Admitting: Physician Assistant

## 2019-11-19 DIAGNOSIS — E7800 Pure hypercholesterolemia, unspecified: Secondary | ICD-10-CM

## 2019-11-19 MED ORDER — SIMVASTATIN 40 MG PO TABS: 40.0000 mg | ORAL_TABLET | Freq: Every day | ORAL | 0 refills | Status: DC

## 2019-11-19 NOTE — Telephone Encounter (Signed)
Patient requesting simvastatin (ZOCOR) 40 MG tablet , informed please allow 48 to 72 hour turn around time  [id number] [place] [place] - [address] Phone:  [phone]  Fax:  [phone]

## 2019-11-19 NOTE — Telephone Encounter (Signed)
Requested medication (s) are due for refill today: yes  Requested medication (s) are on the active medication list: yes   Last refill:  03/09/19  Future visit scheduled: no  Notes to clinic: no cholesterol, HDL, LDL , and triglycerides within last 360 days    Requested Prescriptions  Pending Prescriptions Disp Refills   simvastatin (ZOCOR) 40 MG tablet 90 tablet 1    Sig: Take 1 tablet (40 mg total) by mouth at bedtime.      Cardiovascular:  Antilipid - Statins Failed - 11/19/2019  2:13 PM      Failed - Total Cholesterol in normal range and within 360 days    No results found for: CHOL, POCCHOL, CHOLTOT        Failed - LDL in normal range and within 360 days    No results found for: LDLCALC, LDLC, HIRISKLDL, POCLDL, LDLDIRECT, REALLDLC, TOTLDLC        Failed - HDL in normal range and within 360 days    No results found for: HDL, POCHDL        Failed - Triglycerides in normal range and within 360 days    No results found for: TRIG, POCTRIG        Passed - Patient is not pregnant      Passed - Valid encounter within last 12 months    Recent Outpatient Visits           8 months ago Pneumonia due to COVID-19 virus   [facility], [name], [name]   9 months ago Encounter for support and coordination of transition of care   [place], [name]   9 months ago Acute viral syndrome   [place], [place], [name]   2 years ago Acute viral syndrome   [place], [name]   3 years ago Herpes zoster without complication   [facility] [place], [name]

## 2019-11-21 ENCOUNTER — Other Ambulatory Visit: Payer: Self-pay | Admitting: Physician Assistant

## 2019-11-21 DIAGNOSIS — R600 Localized edema: Secondary | ICD-10-CM

## 2019-11-21 NOTE — Telephone Encounter (Signed)
Requested medications are due for refill today?  Yes  Requested medications are on active medication list?  Yes  Last Refill:  08/19/2019  # 180 with no refills.  Refill had a reminder for patient to call office to schedule an appt for 6 month follow-up and labs.    Future visit scheduled?  No   Notes to Clinic:  Medication failed Rx refill protocol due to no valid encounter within the past 6 months.

## 2019-12-03 ENCOUNTER — Other Ambulatory Visit: Payer: Self-pay | Admitting: Physician Assistant

## 2019-12-03 ENCOUNTER — Telehealth: Payer: Self-pay | Admitting: *Deleted

## 2019-12-03 DIAGNOSIS — E7800 Pure hypercholesterolemia, unspecified: Secondary | ICD-10-CM

## 2019-12-03 MED ORDER — SIMVASTATIN 40 MG PO TABS: 40.0000 mg | ORAL_TABLET | Freq: Every day | ORAL | 0 refills | Status: DC

## 2019-12-03 NOTE — Telephone Encounter (Signed)
Copied from [place] [phone]. Topic: General - Other >> Dec 03, 2019 10:51 AM Leslie Koch wrote: Reason for CRM: Patient called in to request Koch refill on her simvastatin (ZOCOR) 40 MG tablet say that she is getting ready to change insurance and that means Koch new Dr as well so she only need enough to take her into January. Please advise

## 2019-12-03 NOTE — Telephone Encounter (Signed)
  Notes to clinic:  Patient needs a short supply until she is able to get her new insurance and find a new provider    Requested Prescriptions  Pending Prescriptions Disp Refills   simvastatin (ZOCOR) 40 MG tablet 30 tablet 0    Sig: Take 1 tablet (40 mg total) by mouth at bedtime. Please schedule an office visit before anymore refills.      Cardiovascular:  Antilipid - Statins Failed - 12/03/2019 11:11 AM      Failed - Total Cholesterol in normal range and within 360 days    No results found for: CHOL, POCCHOL, CHOLTOT        Failed - LDL in normal range and within 360 days    No results found for: LDLCALC, LDLC, HIRISKLDL, POCLDL, LDLDIRECT, REALLDLC, TOTLDLC        Failed - HDL in normal range and within 360 days    No results found for: HDL, POCHDL        Failed - Triglycerides in normal range and within 360 days    No results found for: TRIG, POCTRIG        Passed - Patient is not pregnant      Passed - Valid encounter within last 12 months    Recent Outpatient Visits           9 months ago Pneumonia due to COVID-19 virus   [facility], [name], [name]   9 months ago Encounter for support and coordination of transition of care   [place], [place], [name]   10 months ago Acute viral syndrome   [place], [place], [name]   2 years ago Acute viral syndrome   [place], [name]   3 years ago Herpes zoster without complication   [facility], [place], [name]

## 2019-12-03 NOTE — Telephone Encounter (Signed)
Copied from [place] [phone]. Topic: Quick Communication - Rx Refill/Question >> Dec 03, 2019 10:48 AM Leward Quan A wrote: Medication: simvastatin (ZOCOR) 40 MG tablet   Has the patient contacted their pharmacy? Yes.   (Agent: If no, request that the patient contact the pharmacy for the refill.) (Agent: If yes, when and what did the pharmacy advise?)  Preferred Pharmacy (with phone number or street name): [id number] - [place], [name]  Phone:  [phone] Fax:  [phone]     Agent: Please be advised that RX refills may take up to 3 business days. We ask that you follow-up with your pharmacy.

## 2019-12-10 ENCOUNTER — Other Ambulatory Visit: Payer: Self-pay | Admitting: Physician Assistant

## 2019-12-10 DIAGNOSIS — R232 Flushing: Secondary | ICD-10-CM

## 2019-12-10 DIAGNOSIS — B349 Viral infection, unspecified: Secondary | ICD-10-CM

## 2019-12-10 DIAGNOSIS — B001 Herpesviral vesicular dermatitis: Secondary | ICD-10-CM

## 2019-12-10 NOTE — Telephone Encounter (Signed)
Requested medication (s) are due for refill today: yes  Requested medication (s) are on the active medication list: yes  Last refill:  albuterol historic provider Patient reports not taking 03/08/19                   Estradiol   07/23/19  #90  1 refill  needs mammogram last 06/16/17                   Lisinopril HCTZ 07/23/19  #90 1 refill  needs labs                   Valacyclovir  03/05/18 Expired R                      Future visit scheduled: no  Notes to clinic: please review    Requested Prescriptions  Pending Prescriptions Disp Refills   lisinopril-hydrochlorothiazide (ZESTORETIC) 20-12.5 MG tablet [Pharmacy Med Name: LISINOPRIL-HCTZ 20-12.5 MG TAB] 90 tablet 1    Sig: TAKE 1 TABLET BY MOUTH EVERY DAY      Cardiovascular:  ACEI + Diuretic Combos Failed - 12/10/2019  3:38 PM      Failed - Na in normal range and within 180 days    Sodium  Date Value Ref Range Status  03/08/2019 143 134 - 144 mmol/L Final  11/28/2013 137 136 - 145 mmol/L Final          Failed - K in normal range and within 180 days    Potassium  Date Value Ref Range Status  03/08/2019 3.5 3.5 - 5.2 mmol/L Final  11/28/2013 3.9 3.5 - 5.1 mmol/L Final          Failed - Cr in normal range and within 180 days    Creatinine  Date Value Ref Range Status  11/28/2013 0.73 0.60 - 1.30 mg/dL Final   Creatinine, Ser  Date Value Ref Range Status  03/08/2019 0.81 0.57 - 1.00 mg/dL Final          Failed - Ca in normal range and within 180 days    Calcium  Date Value Ref Range Status  03/08/2019 9.3 8.7 - 10.3 mg/dL Final   Calcium, Total  Date Value Ref Range Status  11/28/2013 8.0 (L) 8.5 - 10.1 mg/dL Final          Failed - Valid encounter within last 6 months    Recent Outpatient Visits           9 months ago Pneumonia due to COVID-19 virus   [facility], [name], [name]   9 months ago Encounter for support and coordination of transition of care   [facility] [place], [name]   10 months ago Acute viral syndrome   [place], [place], [name]   2 years ago Acute viral syndrome   [place], [name]   3 years ago Herpes zoster without complication   [place], [place], [place] - Patient is not pregnant      Passed - Last BP in normal range    BP Readings from Last 1 Encounters:  03/08/19 115/80            estradiol (ESTRACE) 1 MG tablet [Pharmacy Med Name: ESTRADIOL 1 MG TABLET] 90 tablet 1    Sig: TAKE 1 TABLET BY  MOUTH EVERY DAY      OB/GYN:  Estrogens Failed - 12/10/2019  3:38 PM      Failed - Mammogram is up-to-date per Health Maintenance      Passed - Last BP in normal range    BP Readings from Last 1 Encounters:  03/08/19 115/80          Passed - Valid encounter within last 12 months    Recent Outpatient Visits           9 months ago Pneumonia due to COVID-19 virus   [facility], [name], [name]   9 months ago Encounter for support and coordination of transition of care   [facility] [place], [name]   10 months ago Acute viral syndrome   [facility] [place], [place], [name]   2 years ago Acute viral syndrome   [place], [name]   3 years ago Herpes zoster without complication   [facility], [place], [name]                valACYclovir (VALTREX) 1000 MG tablet [Pharmacy Med Name: VALACYCLOVIR HCL 1 GRAM TABLET] 28 tablet 3    Sig: Take 1-2 tablets (1,000-2,000 mg total) by mouth every 12 (twelve) hours as needed.      Antimicrobials:  Antiviral Agents - Anti-Herpetic Passed - 12/10/2019  3:38 PM      Passed - Valid encounter within last 12 months    Recent Outpatient Visits           9 months ago Pneumonia due to COVID-19 virus   [facility],  [name], [name]   9 months ago Encounter for support and coordination of transition of care   [facility] [place], [name]   10 months ago Acute viral syndrome   [facility] [place], [place], [name]   2 years ago Acute viral syndrome   [place], [name]   3 years ago Herpes zoster without complication   [facility], [place], [name]                albuterol (VENTOLIN HFA) 108 (90 Base) MCG/ACT inhaler [Pharmacy Med Name: ALBUTEROL HFA (PROAIR) INHALER] 8.5 each     Sig: TAKE 2 PUFFS BY MOUTH EVERY 6 HOURS AS NEEDED FOR WHEEZE OR SHORTNESS OF BREATH      Pulmonology:  Beta Agonists Failed - 12/10/2019  3:38 PM      Failed - One inhaler should last at least one month. If the patient is requesting refills earlier, contact the patient to check for uncontrolled symptoms.      Passed - Valid encounter within last 12 months    Recent Outpatient Visits           9 months ago Pneumonia due to COVID-19 virus   [facility], [name], [name]   9 months ago Encounter for support and coordination of transition of care   [facility] [place], [name]   10 months ago Acute viral syndrome   [facility] [place], [place], [name]   2 years ago Acute viral syndrome   [place], [place], [name]   3 years ago Herpes zoster without complication   [facility], [place], [name]

## 2019-12-14 ENCOUNTER — Other Ambulatory Visit: Payer: Self-pay | Admitting: Physician Assistant

## 2019-12-14 DIAGNOSIS — R600 Localized edema: Secondary | ICD-10-CM

## 2020-01-20 ENCOUNTER — Other Ambulatory Visit: Payer: Self-pay | Admitting: Physician Assistant

## 2020-01-20 DIAGNOSIS — M2550 Pain in unspecified joint: Secondary | ICD-10-CM

## 2020-02-08 ENCOUNTER — Other Ambulatory Visit: Payer: Self-pay | Admitting: Physician Assistant

## 2020-02-08 DIAGNOSIS — R232 Flushing: Secondary | ICD-10-CM

## 2020-02-08 NOTE — Telephone Encounter (Signed)
Requested Prescriptions  Pending Prescriptions Disp Refills  . lisinopril-hydrochlorothiazide (ZESTORETIC) 20-12.5 MG tablet [Pharmacy Med Name: LISINOPRIL-HCTZ 20-12.5 MG TAB] 30 tablet 0    Sig: TAKE 1 TABLET BY MOUTH DAILY. PLEASE SCHEDULE AN OFFICE VISIT BEFORE ANYMORE REFILLS.     Cardiovascular:  ACEI + Diuretic Combos Failed - 02/08/2020 12:31 PM      Failed - Na in normal range and within 180 days    Sodium  Date Value Ref Range Status  03/08/2019 143 134 - 144 mmol/L Final  11/28/2013 137 136 - 145 mmol/L Final         Failed - K in normal range and within 180 days    Potassium  Date Value Ref Range Status  03/08/2019 3.5 3.5 - 5.2 mmol/L Final  11/28/2013 3.9 3.5 - 5.1 mmol/L Final         Failed - Cr in normal range and within 180 days    Creatinine  Date Value Ref Range Status  11/28/2013 0.73 0.60 - 1.30 mg/dL Final   Creatinine, Ser  Date Value Ref Range Status  03/08/2019 0.81 0.57 - 1.00 mg/dL Final         Failed - Ca in normal range and within 180 days    Calcium  Date Value Ref Range Status  03/08/2019 9.3 8.7 - 10.3 mg/dL Final   Calcium, Total  Date Value Ref Range Status  11/28/2013 8.0 (L) 8.5 - 10.1 mg/dL Final         Failed - Valid encounter within last 6 months    Recent Outpatient Visits          11 months ago Pneumonia due to COVID-19 virus   [facility], [name], [name]   11 months ago Encounter for support and coordination of transition of care   [place], [place], [name]   1 year ago Acute viral syndrome   [place], [place], [name]   2 years ago Acute viral syndrome   [place], [name]   3 years ago Herpes zoster without complication   [place], [name], [name]      Future Appointments            In 3 weeks Marlyn Corporal, [name], [name] [employer], [place] -  Patient is not pregnant      Passed - Last BP in normal range    BP Readings from Last 1 Encounters:  03/08/19 115/80         Called and scheduled OV for 03/03/20. Gave 30 courtesy refill

## 2020-02-08 NOTE — Telephone Encounter (Signed)
Requested medication (s) are due for refill today: yes  Requested medication (s) are on the active medication list: yes  Last refill:  12/11/19  #30  0 refills  Future visit scheduled:No attempted to call 2 times and each time line was busy  Notes to clinic:  pharmacy requesting 90 day. Last refill states needs appointment    Requested Prescriptions  Pending Prescriptions Disp Refills   estradiol (ESTRACE) 1 MG tablet [Pharmacy Med Name: ESTRADIOL 1 MG TABLET] 90 tablet 1    Sig: Take 1 tablet (1 mg total) by mouth daily. Please schedule an office visit before anymore refills.      OB/GYN:  Estrogens Failed - 02/08/2020 12:30 PM      Failed - Mammogram is up-to-date per Health Maintenance      Passed - Last BP in normal range    BP Readings from Last 1 Encounters:  03/08/19 115/80          Passed - Valid encounter within last 12 months    Recent Outpatient Visits           11 months ago Pneumonia due to COVID-19 virus   [facility], [name], [name]   11 months ago Encounter for support and coordination of transition of care   [place], [name], [name]   1 year ago Acute viral syndrome   [place], [place], [name]   2 years ago Acute viral syndrome   [place], [name]   3 years ago Herpes zoster without complication   [facility], [facility], [place], [name]

## 2020-03-03 ENCOUNTER — Ambulatory Visit: Payer: 59 | Admitting: Physician Assistant

## 2020-03-03 ENCOUNTER — Other Ambulatory Visit: Payer: Self-pay

## 2020-03-03 ENCOUNTER — Encounter: Payer: Self-pay | Admitting: Physician Assistant

## 2020-03-03 VITALS — BP 125/80 | HR 77 | Temp 98.0°F | Ht 62.0 in | Wt 222.0 lb

## 2020-03-03 DIAGNOSIS — R232 Flushing: Secondary | ICD-10-CM

## 2020-03-03 DIAGNOSIS — I10 Essential (primary) hypertension: Secondary | ICD-10-CM | POA: Diagnosis not present

## 2020-03-03 DIAGNOSIS — E7800 Pure hypercholesterolemia, unspecified: Secondary | ICD-10-CM | POA: Diagnosis not present

## 2020-03-03 DIAGNOSIS — Z1159 Encounter for screening for other viral diseases: Secondary | ICD-10-CM

## 2020-03-03 DIAGNOSIS — R600 Localized edema: Secondary | ICD-10-CM | POA: Diagnosis not present

## 2020-03-03 DIAGNOSIS — Z6841 Body Mass Index (BMI) 40.0 and over, adult: Secondary | ICD-10-CM

## 2020-03-03 DIAGNOSIS — Z1239 Encounter for other screening for malignant neoplasm of breast: Secondary | ICD-10-CM

## 2020-03-03 DIAGNOSIS — Z1211 Encounter for screening for malignant neoplasm of colon: Secondary | ICD-10-CM

## 2020-03-03 MED ORDER — ESTRADIOL 1 MG PO TABS: 1.0000 mg | ORAL_TABLET | Freq: Every day | ORAL | 1 refills | Status: DC

## 2020-03-03 MED ORDER — LISINOPRIL-HYDROCHLOROTHIAZIDE 20-12.5 MG PO TABS: 1.0000 | ORAL_TABLET | Freq: Every day | ORAL | 1 refills | Status: DC

## 2020-03-03 MED ORDER — FUROSEMIDE 40 MG PO TABS: 40.0000 mg | ORAL_TABLET | Freq: Two times a day (BID) | ORAL | 1 refills | Status: DC | PRN

## 2020-03-03 MED ORDER — SIMVASTATIN 40 MG PO TABS: 40.0000 mg | ORAL_TABLET | Freq: Every day | ORAL | 1 refills | Status: DC

## 2020-03-03 NOTE — Patient Instructions (Addendum)
Please call the [facility] at [facility] at [phone] to schedule your mammogram.   Preventive Care 50-64 Years Old, Female Preventive care refers to lifestyle choices and visits with your health care provider that can promote health and wellness. This includes:  A yearly physical exam. This is also called an annual wellness visit.  Regular dental and eye exams.  Immunizations.  Screening for certain conditions.  Healthy lifestyle choices, such as: ? Eating a healthy diet. ? Getting regular exercise. ? Not using drugs or products that contain nicotine and tobacco. ? Limiting alcohol use. What can I expect for my preventive care visit? Physical exam Your health care provider will check your:  Height and weight. These may be used to calculate your BMI (body mass index). BMI is a measurement that tells if you are at a healthy weight.  Heart rate and blood pressure.  Body temperature.  Skin for abnormal spots. Counseling Your health care provider may ask you questions about your:  Past medical problems.  Family's medical history.  Alcohol, tobacco, and drug use.  Emotional well-being.  Home life and relationship well-being.  Sexual activity.  Diet, exercise, and sleep habits.  Work and work Statistician.  Access to firearms.  Method of birth control.  Menstrual cycle.  Pregnancy history. What immunizations do I need? Vaccines are usually given at various ages, according to a schedule. Your health care provider will recommend vaccines for you based on your age, medical history, and lifestyle or other factors, such as travel or where you work.   What tests do I need? Blood tests  Lipid and cholesterol levels. These may be checked every 5 years, or more often if you are over 4 years old.  Hepatitis C test.  Hepatitis B test. Screening  Lung cancer screening. You may have this screening every year starting at age  62 if you have a 30-pack-year history of smoking and currently smoke or have quit within the past 15 years.  Colorectal cancer screening. ? All adults should have this screening starting at age 76 and continuing until age 54. ? Your health care provider may recommend screening at age 39 if you are at increased risk. ? You will have tests every 1-10 years, depending on your results and the type of screening test.  Diabetes screening. ? This is done by checking your blood sugar (glucose) after you have not eaten for a while (fasting). ? You may have this done every 1-3 years.  Mammogram. ? This may be done every 1-2 years. ? Talk with your health care provider about when you should start having regular mammograms. This may depend on whether you have a family history of breast cancer.  BRCA-related cancer screening. This may be done if you have a family history of breast, ovarian, tubal, or peritoneal cancers.  Pelvic exam and Pap test. ? This may be done every 3 years starting at age 13. ? Starting at age 37, this may be done every 5 years if you have a Pap test in combination with an HPV test. Other tests  STD (sexually transmitted disease) testing, if you are at risk.  Bone density scan. This is done to screen for osteoporosis. You may have this scan if you are at high risk for osteoporosis. Talk with your health care provider about your test results, treatment options, and if necessary, the need for more tests. Follow these instructions at home: Eating and drinking  Eat a diet  that includes fresh fruits and vegetables, whole grains, lean protein, and low-fat dairy products.  Take vitamin and mineral supplements as recommended by your health care provider.  Do not drink alcohol if: ? Your health care provider tells you not to drink. ? You are pregnant, may be pregnant, or are planning to become pregnant.  If you drink alcohol: ? Limit how much you have to 0-1 drink a day. ? Be  aware of how much alcohol is in your drink. In the [place] one drink equals one 12 oz bottle of beer (355 mL), one 5 oz glass of wine (148 mL), or one 1 oz glass of hard liquor (44 mL).   Lifestyle  Take daily care of your teeth and gums. Brush your teeth every morning and night with fluoride toothpaste. Floss one time each day.  Stay active. Exercise for at least 30 minutes 5 or more days each week.  Do not use any products that contain nicotine or tobacco, such as cigarettes, e-cigarettes, and chewing tobacco. If you need help quitting, ask your health care provider.  Do not use drugs.  If you are sexually active, practice safe sex. Use a condom or other form of protection to prevent STIs (sexually transmitted infections).  If you do not wish to become pregnant, use a form of birth control. If you plan to become pregnant, see your health care provider for a prepregnancy visit.  If told by your health care provider, take low-dose aspirin daily starting at age 37.  Find healthy ways to cope with stress, such as: ? Meditation, yoga, or listening to music. ? Journaling. ? Talking to a trusted person. ? Spending time with friends and family. Safety  Always wear your seat belt while driving or riding in a vehicle.  Do not drive: ? If you have been drinking alcohol. Do not ride with someone who has been drinking. ? When you are tired or distracted. ? While texting.  Wear a helmet and other protective equipment during sports activities.  If you have firearms in your house, make sure you follow all gun safety procedures. What's next?  Visit your health care provider once a year for an annual wellness visit.  Ask your health care provider how often you should have your eyes and teeth checked.  Stay up to date on all vaccines. This information is not intended to replace advice given to you by your health care provider. Make sure you discuss any questions you have with your health care  provider. Document Revised: 10/02/2019 Document Reviewed: 09/08/2017 Elsevier Patient Education  2021 [employer].

## 2020-03-03 NOTE — Progress Notes (Signed)
Established patient visit   Patient: Leslie Koch   DOB: [birth year]   65 y.o. Female  MRN: [id number] Visit Date: 03/03/2020  Today's healthcare provider: Mar Daring, [name]   No chief complaint on file.  Subjective    HPI   Lipid/Cholesterol, Follow-up  Last lipid panel Other pertinent labs  No results found for: CHOL, HDL, LDLCALC, LDLDIRECT, TRIG, CHOLHDL Lab Results  Component Value Date   ALT 25 03/08/2019   AST 25 03/08/2019   PLT 260 03/08/2019   TSH 1.850 01/23/2016     She was last seen for this 1 years ago.  Management since that visit includes continue current medications.  She reports excellent compliance with treatment. She is not having side effects.   Symptoms: No chest pain No chest pressure/discomfort  No dyspnea No lower extremity edema  No numbness or tingling of extremity No orthopnea  No palpitations No paroxysmal nocturnal dyspnea  No speech difficulty No syncope   Current diet: well balanced Current exercise: none  The ASCVD Risk score ([place]., et al., 2013) failed to calculate for the following reasons:   Cannot find a previous HDL lab   Cannot find a previous total cholesterol lab  ---------------------------------------------------------------------------------------------------  Hypertension, follow-up  BP Readings from Last 3 Encounters:  03/08/19 115/80  02/11/19 91/62  02/06/19 126/68   Wt Readings from Last 3 Encounters:  03/08/19 226 lb (102.5 kg)  02/06/19 200 lb 9.9 oz (91 kg)  02/04/19 200 lb (90.7 kg)     She was last seen for hypertension 1 years ago.  BP at that visit was 115/80. Management since that visit includes continue current medications.  She reports excellent compliance with treatment. She is not having side effects.  She is following a Regular diet. She is not exercising. She does not smoke.  Use of agents associated with hypertension: none.   Outside blood pressures are  N/A. Symptoms: No chest pain No chest pressure  No palpitations No syncope  No dyspnea No orthopnea  No paroxysmal nocturnal dyspnea No lower extremity edema   Pertinent labs: No results found for: CHOL, HDL, LDLCALC, LDLDIRECT, TRIG, CHOLHDL Lab Results  Component Value Date   NA 143 03/08/2019   K 3.5 03/08/2019   CREATININE 0.81 03/08/2019   GFRNONAA 78 03/08/2019   GFRAA 90 03/08/2019   GLUCOSE 132 (H) 03/08/2019     The ASCVD Risk score Mikey Bussing DC Jr., et al., 2013) failed to calculate for the following reasons:   Cannot find a previous HDL lab   Cannot find a previous total cholesterol lab   ---------------------------------------------------------------------------------------------------     Patient Active Problem List   Diagnosis Date Noted  . Essential hypertension 02/06/2019  . Acute respiratory disease due to COVID-19 virus 02/06/2019  . Pneumonia due to COVID-19 virus 02/04/2019  . Lymphedema 10/27/2016  . Chronic venous insufficiency 10/27/2016  . Leg swelling 08/24/2016  . SOB (shortness of breath) 08/24/2016  . Smoker 08/24/2016  . 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 Failed total knee arthroplasty ([place]) 02/11/2016  . Hot flashes, menopausal 08/14/2014  . Cystocele 08/09/2014  . Rectocele 08/09/2014  . Anxiety 08/06/2014  . Acid reflux 08/06/2014  . Hypercholesteremia 08/06/2014  . Benign essential HTN 08/06/2014  . Cannot sleep 08/06/2014  . Post hysterectomy menopause 08/06/2014  . Avitaminosis D 08/06/2014   Past Medical History:  Diagnosis Date  . Arthritis   . GERD (gastroesophageal reflux disease)   .  Hypertension   . PONV (postoperative nausea and vomiting)         Medications: Outpatient Medications Prior to Visit  Medication Sig  . albuterol (VENTOLIN HFA) 108 (90 Base) MCG/ACT inhaler TAKE 2 PUFFS BY MOUTH EVERY 6 HOURS AS NEEDED FOR WHEEZE OR SHORTNESS OF BREATH  . ascorbic acid (VITAMIN C) 500 MG tablet Take 1  tablet (500 mg total) by mouth daily.  Marland Kitchen aspirin EC 81 MG tablet Take 81 mg by mouth daily.   . Calcium Carbonate-Vitamin D (CALCIUM 500/D PO) Take 1 tablet by mouth daily.   . celecoxib (CELEBREX) 200 MG capsule TAKE 1 CAPSULE BY MOUTH EVERY DAY  . cetirizine (ZYRTEC ALLERGY) 10 MG tablet Take 10 mg by mouth daily.   . Cholecalciferol (VITAMIN D3) 1000 units CAPS Take 1,000 Units by mouth daily.   Marland Kitchen estradiol (ESTRACE) 1 MG tablet TAKE 1 TABLET (1 MG TOTAL) BY MOUTH DAILY. PLEASE SCHEDULE AN OFFICE VISIT BEFORE ANYMORE REFILLS.  . fluticasone (FLONASE) 50 MCG/ACT nasal spray Place 2 sprays into both nostrils daily.  . furosemide (LASIX) 40 MG tablet TAKE 1 TABLET BY MOUTH TWICE A DAY AS NEEDED  . Glucosamine-Chondroitin 750-600 MG TABS Take 1 tablet by mouth as directed.   . Ipratropium-Albuterol (COMBIVENT) 20-100 MCG/ACT AERS respimat Inhale 1 puff into the lungs every 6 (six) hours.  Marland Kitchen lisinopril-hydrochlorothiazide (ZESTORETIC) 20-12.5 MG tablet TAKE 1 TABLET BY MOUTH DAILY. PLEASE SCHEDULE AN OFFICE VISIT BEFORE ANYMORE REFILLS.  . Multiple Vitamin (MULTIVITAMIN) tablet Take 1 tablet by mouth daily.   . Omega-3 1000 MG CAPS Take 2,000 mg by mouth daily.  Marland Kitchen oxymetazoline (AFRIN) 0.05 % nasal spray Place 1-2 sprays into both nostrils 2 (two) times daily as needed for congestion.  . potassium chloride (K-DUR) 10 MEQ tablet Take 1 tablet (10 mEq total) by mouth 2 (two) times daily as needed. (Patient taking differently: Take 10 mEq by mouth daily. )  . senna-docusate (SENOKOT-S) 8.6-50 MG tablet Take 1 tablet by mouth at bedtime.  . simvastatin (ZOCOR) 40 MG tablet Take 1 tablet (40 mg total) by mouth at bedtime. Please schedule an office visit before anymore refills.  . valACYclovir (VALTREX) 1000 MG tablet TAKE 1-2 TABLETS (1,000-2,000 MG TOTAL) BY MOUTH EVERY 12 (TWELVE) HOURS AS NEEDED.  Marland Kitchen zinc sulfate 220 (50 Zn) MG capsule Take 1 capsule (220 mg total) by mouth daily.   No  facility-administered medications prior to visit.    Review of Systems  Constitutional: Positive for fatigue.  Respiratory: Negative.   Cardiovascular: Negative.   Neurological: Negative.     Last CBC Lab Results  Component Value Date   WBC 6.6 03/08/2019   HGB 14.1 03/08/2019   HCT 40.8 03/08/2019   MCV 95 03/08/2019   MCH 32.9 03/08/2019   RDW 14.8 03/08/2019   PLT 260 95/62/1308   Last metabolic panel Lab Results  Component Value Date   GLUCOSE 132 (H) 03/08/2019   NA 143 03/08/2019   K 3.5 03/08/2019   CL 103 03/08/2019   CO2 18 (L) 03/08/2019   BUN 20 03/08/2019   CREATININE 0.81 03/08/2019   GFRNONAA 78 03/08/2019   GFRAA 90 03/08/2019   CALCIUM 9.3 03/08/2019   PHOS 2.7 02/06/2019   PROT 6.2 03/08/2019   ALBUMIN 4.1 03/08/2019   LABGLOB 2.1 03/08/2019   AGRATIO 2.0 03/08/2019   BILITOT 0.3 03/08/2019   ALKPHOS 50 03/08/2019   AST 25 03/08/2019   ALT 25 03/08/2019   ANIONGAP 12  02/10/2019       Objective    There were no vitals taken for this visit. BP Readings from Last 3 Encounters:  03/03/20 125/80  03/08/19 115/80  02/11/19 91/62   Wt Readings from Last 3 Encounters:  03/03/20 222 lb (100.7 kg)  03/08/19 226 lb (102.5 kg)  02/06/19 200 lb 9.9 oz (91 kg)       Physical Exam Vitals reviewed.  Constitutional:      General: She is not in acute distress.    Appearance: Normal appearance. She is well-developed and well-nourished. She is obese. She is not ill-appearing or diaphoretic.  HENT:     Head: Normocephalic and atraumatic.  Cardiovascular:     Rate and Rhythm: Normal rate and regular rhythm.     Heart sounds: Normal heart sounds. No murmur heard. No friction rub. No gallop.   Pulmonary:     Effort: Pulmonary effort is normal. No respiratory distress.     Breath sounds: Normal breath sounds. No wheezing or rales.  Musculoskeletal:     Cervical back: Normal range of motion and neck supple.  Neurological:     Mental Status: She is  alert.  Psychiatric:        Mood and Affect: Mood normal.        Thought Content: Thought content normal.      No results found for any visits on 03/03/20.  Assessment & Plan     1. Primary hypertension Stable. Diagnosis pulled for medication refill. Continue current medical treatment plan. Will check labs as below and f/u pending results. - Lipid Panel With LDL/HDL Ratio - Comprehensive metabolic panel - CBC With Differential - lisinopril-hydrochlorothiazide (ZESTORETIC) 20-12.5 MG tablet; Take 1 tablet by mouth daily.  Dispense: 90 tablet; Refill: 1 - HgB A1c  2. Lower extremity edema Stable. Diagnosis pulled for medication refill. Continue current medical treatment plan. - furosemide (LASIX) 40 MG tablet; Take 1 tablet (40 mg total) by mouth 2 (two) times daily as needed.  Dispense: 180 tablet; Refill: 1  3. Hot flash not due to menopause Stable. Diagnosis pulled for medication refill. Continue current medical treatment plan. - estradiol (ESTRACE) 1 MG tablet; Take 1 tablet (1 mg total) by mouth daily.  Dispense: 90 tablet; Refill: 1  4. Hypercholesterolemia Stable. Diagnosis pulled for medication refill. Continue current medical treatment plan. Will check labs as below and f/u pending results. - Lipid Panel With LDL/HDL Ratio - Comprehensive metabolic panel - CBC With Differential - simvastatin (ZOCOR) 40 MG tablet; Take 1 tablet (40 mg total) by mouth at bedtime.  Dispense: 90 tablet; Refill: 1 - HgB A1c  5. Class 3 severe obesity due to excess calories with serious comorbidity and body mass index (BMI) of 40.0 to 44.9 in adult [facility]) Counseled patient on healthy lifestyle modifications including dieting and exercise. Will check labs as below and f/u pending results. - Lipid Panel With LDL/HDL Ratio - Comprehensive metabolic panel - CBC With Differential - HgB A1c  6. Encounter for breast cancer screening using non-mammogram modality There is no family history of breast  cancer. She does perform regular self breast exams. Mammogram was ordered as below. Information for [facility] clinic was given to patient so she may schedule her mammogram at her convenience. - MM 3D SCREEN BREAST BILATERAL; Future  7. Encounter for hepatitis C screening test for low risk patient Will check labs as below and f/u pending results. - Hepatitis C Antibody  8. Colon cancer screening Last one  done with Dr. Vira Agar at [place]. No record, unknown date.   No follow-ups on file.      Reynolds Bowl, [name], have reviewed all documentation for this visit. The documentation on 03/03/20 for the exam, diagnosis, procedures, and orders are all accurate and complete.   Rubye Beach  [facility] [phone] (phone) [phone] (fax)  [place]

## 2020-03-04 LAB — CBC WITH DIFFERENTIAL
Basophils Absolute: 0.1 10*3/uL (ref 0.0–0.2)
Basos: 1 %
EOS (ABSOLUTE): 0.1 10*3/uL (ref 0.0–0.4)
Eos: 1 %
Hematocrit: 41.9 % (ref 34.0–46.6)
Hemoglobin: 14.1 g/dL (ref 11.1–15.9)
Immature Grans (Abs): 0 10*3/uL (ref 0.0–0.1)
Immature Granulocytes: 0 %
Lymphocytes Absolute: 2.6 10*3/uL (ref 0.7–3.1)
Lymphs: 36 %
MCH: 30.9 pg (ref 26.6–33.0)
MCHC: 33.7 g/dL (ref 31.5–35.7)
MCV: 92 fL (ref 79–97)
Monocytes Absolute: 0.4 10*3/uL (ref 0.1–0.9)
Monocytes: 6 %
Neutrophils Absolute: 4 10*3/uL (ref 1.4–7.0)
Neutrophils: 56 %
RBC: 4.56 x10E6/uL (ref 3.77–5.28)
RDW: 12.9 % (ref 11.7–15.4)
WBC: 7.2 10*3/uL (ref 3.4–10.8)

## 2020-03-04 LAB — COMPREHENSIVE METABOLIC PANEL
ALT: 10 IU/L (ref 0–32)
AST: 15 IU/L (ref 0–40)
Albumin/Globulin Ratio: 1.6 (ref 1.2–2.2)
Albumin: 4 g/dL (ref 3.8–4.8)
Alkaline Phosphatase: 62 IU/L (ref 44–121)
BUN/Creatinine Ratio: 17 (ref 12–28)
BUN: 12 mg/dL (ref 8–27)
Bilirubin Total: 0.3 mg/dL (ref 0.0–1.2)
CO2: 24 mmol/L (ref 20–29)
Calcium: 8.9 mg/dL (ref 8.7–10.3)
Chloride: 104 mmol/L (ref 96–106)
Creatinine, Ser: 0.69 mg/dL (ref 0.57–1.00)
GFR calc Af Amer: 107 mL/min/{1.73_m2} (ref >59)
GFR calc non Af Amer: 93 mL/min/{1.73_m2} (ref >59)
Globulin, Total: 2.5 g/dL (ref 1.5–4.5)
Glucose: 80 mg/dL (ref 65–99)
Potassium: 4.5 mmol/L (ref 3.5–5.2)
Sodium: 143 mmol/L (ref 134–144)
Total Protein: 6.5 g/dL (ref 6.0–8.5)

## 2020-03-04 LAB — LIPID PANEL WITH LDL/HDL RATIO
Cholesterol, Total: 196 mg/dL (ref 100–199)
HDL: 68 mg/dL (ref >39)
LDL Chol Calc (NIH): 99 mg/dL (ref 0–99)
LDL/HDL Ratio: 1.5 ratio (ref 0.0–3.2)
Triglycerides: 172 mg/dL — ABNORMAL HIGH (ref 0–149)
VLDL Cholesterol Cal: 29 mg/dL (ref 5–40)

## 2020-03-04 LAB — HEPATITIS C ANTIBODY: Hep C Virus Ab: <0.1 s/co ratio (ref 0.0–0.9)

## 2020-03-04 LAB — HEMOGLOBIN A1C
Est. average glucose Bld gHb Est-mCnc: 111 mg/dL
Hgb A1c MFr Bld: 5.5 % (ref 4.8–5.6)

## 2020-06-04 ENCOUNTER — Ambulatory Visit: Payer: Self-pay | Admitting: *Deleted

## 2020-06-04 NOTE — Telephone Encounter (Signed)
Per initial encounter, "Patient is currently experiencing a fever, cough and congestion;  Patient shares that the phlegm they're coughing is yellow in color; Patient feels a headache in the front and rear of their head; Patient has tested negative for COVID(twice) but shares that they've been exposed repeatedly and feel as if the results are false; Patient began feeling symptoms Monday 06/02/20; Please contact to further advise when possible; Temp 99.8; sinus pressure in forehead, eyes and nose; contacted pt to discuss symptoms; she is concerned because she has been in daily contact with multiple famiiy members who tested +COVID on 06/02/20; the pt also had COVID January 2022 and developed pneumonia; recommendations made per nurse triage protocol; she verbalized understanding; the pt is seen at [facility]; decision tree completed; there is no availability; the pt was encouraged to consider a [facility] or being evaluated at Urgent Care; she would like to be seen by one of the providers at [facility]; she can be contacted at [phone]; will route to office for notification.   Reason for Disposition . [1] SEVERE headache AND [2] fever . MILD difficulty breathing (e.g., minimal/no SOB at rest, SOB with walking, pulse <100)  Answer Assessment - Initial Assessment Questions 1. LOCATION: "Where does it hurt?"      Forehead, eyes 2. ONSET: "When did the sinus pain start?"  (e.g., hours, days)      *No Answer* 3. SEVERITY: "How bad is the pain?"   (Scale 1-10; mild, moderate or severe)   - MILD (1-3): doesn't interfere with normal activities    - MODERATE (4-7): interferes with normal activities (e.g., work or school) or awakens from sleep   - SEVERE (8-10): excruciating pain and patient unable to do any normal activities        8 out of 10 4. RECURRENT SYMPTOM: "Have you ever had sinus problems before?" If Yes, ask: "When was the last time?" and "What happened that time?"     Yes "lots  of sinus problems 5. NASAL CONGESTION: "Is the nose blocked?" If Yes, ask: "Can you open it or must you breathe through your mouth?"     *No Answer* 6. NASAL DISCHARGE: "Do you have discharge from your nose?" If so ask, "What color?"     *No Answer* 7. FEVER: "Do you have a fever?" If Yes, ask: "What is it, how was it measured, and when did it start?"      *No Answer* 8. OTHER SYMPTOMS: "Do you have any other symptoms?" (e.g., sore throat, cough, earache, difficulty breathing)     *No Answer* 9. PREGNANCY: "Is there any chance you are pregnant?" "When was your last menstrual period?"     *No Answer*  Answer Assessment - Initial Assessment Questions 1. COVID-19 DIAGNOSIS: "Who made your COVID-19 diagnosis?" "Was it confirmed by a positive lab test or self-test?" If not diagnosed by a doctor (or NP/PA), ask "Are there lots of cases (community spread) where you live?" Note: See public health department website, if unsure.     *No Answer* 2. COVID-19 EXPOSURE: "Was there any known exposure to COVID before the symptoms began?" CDC Definition of close contact: within 6 feet (2 meters) for a total of 15 minutes or more over a 24-hour period.      06/02/20 3. ONSET: "When did the COVID-19 symptoms start?"     06/02/20 4. WORST SYMPTOM: "What is your worst symptom?" (e.g., cough, fever, shortness of breath, muscle aches)     cough 5. COUGH: "  Do you have a cough?" If Yes, ask: "How bad is the cough?"       Yes, productive with yellow secretions; keeps pt up at night 6. FEVER: "Do you have a fever?" If Yes, ask: "What is your temperature, how was it measured, and when did it start?"     99.8 7. RESPIRATORY STATUS: "Describe your breathing?" (e.g., shortness of breath, wheezing, unable to speak)      Shortness of breath with activity 8. BETTER-SAME-WORSE: "Are you getting better, staying the same or getting worse compared to yesterday?"  If getting worse, ask, "In what way?"     same 9. HIGH RISK  DISEASE: "Do you have any chronic medical problems?" (e.g., asthma, heart or lung disease, weak immune system, obesity, etc.)     COVID Pneumonia 1/22 10. VACCINE: "Have you had the COVID-19 vaccine?" If Yes, ask: "Which one, how many shots, when did you get it?"    Moderna x 2 11. BOOSTER: "Have you received your COVID-19 booster?" If Yes, ask: "Which one and when did you get it?"       no 12. PREGNANCY: "Is there any chance you are pregnant?" "When was your last menstrual period?"       no 13. OTHER SYMPTOMS: "Do you have any other symptoms?"  (e.g., chills, fatigue, headache, loss of smell or taste, muscle pain, sore throat)       Muscle aches 14. O2 SATURATION MONITOR:  "Do you use an oxygen saturation monitor (pulse oximeter) at home?" If Yes, ask "What is your reading (oxygen level) today?" "What is your usual oxygen saturation reading?" (e.g., 95%)     96% 06/03/20  Protocols used: SINUS PAIN OR CONGESTION-A-AH, CORONAVIRUS (COVID-19) DIAGNOSED OR SUSPECTED-A-AH

## 2020-06-04 NOTE — Telephone Encounter (Signed)
Patient was advised to contact [facility] or urgent care.  She is going to make appointment with telemed

## 2020-06-04 NOTE — Telephone Encounter (Signed)
Noted  

## 2020-07-29 ENCOUNTER — Other Ambulatory Visit: Payer: Self-pay | Admitting: Physician Assistant

## 2020-07-29 DIAGNOSIS — M2550 Pain in unspecified joint: Secondary | ICD-10-CM

## 2020-08-06 ENCOUNTER — Other Ambulatory Visit: Payer: Self-pay | Admitting: Physician Assistant

## 2020-08-06 DIAGNOSIS — E7800 Pure hypercholesterolemia, unspecified: Secondary | ICD-10-CM

## 2020-08-06 DIAGNOSIS — I10 Essential (primary) hypertension: Secondary | ICD-10-CM

## 2020-08-06 DIAGNOSIS — R232 Flushing: Secondary | ICD-10-CM

## 2020-08-07 NOTE — Telephone Encounter (Signed)
Pt called in to follow up on refill request. She says that she also need a refill for estradiol (ESTRACE) 1 MG tablet  as well     Pharmacy:  [id number]- [place], [place] - [address] Phone:  [phone] Fax:  [phone]

## 2020-08-07 NOTE — Telephone Encounter (Signed)
Pt called in to follow up on refill request. Pt says that she has been with out her medication for 2 days. Pt says that she was told that a different provider would assist her with Rx. Pt says that she is scheduled to see Dr. B but is unable to wait until apt for medication.    Pharmacy:  [id number]- [place], [address] Phone:  [phone] Fax:  [phone]

## 2020-08-07 NOTE — Telephone Encounter (Signed)
Notes to clinic: There is no protocol for Estrace Review manually for refill    Requested Prescriptions  Pending Prescriptions Disp Refills   estradiol (ESTRACE) 1 MG tablet 90 tablet 0    Sig: Take 1 tablet (1 mg total) by mouth daily.      There is no refill protocol information for this order     Signed Prescriptions Disp Refills   simvastatin (ZOCOR) 40 MG tablet 90 tablet 0    Sig: TAKE 1 TABLET BY MOUTH EVERYDAY AT BEDTIME      Cardiovascular:  Antilipid - Statins Failed - 08/07/2020  1:23 PM      Failed - Triglycerides in normal range and within 360 days    Triglycerides  Date Value Ref Range Status  03/03/2020 172 (H) 0 - 149 mg/dL Final          Passed - Total Cholesterol in normal range and within 360 days    Cholesterol, Total  Date Value Ref Range Status  03/03/2020 196 100 - 199 mg/dL Final          Passed - LDL in normal range and within 360 days    LDL Chol Calc (NIH)  Date Value Ref Range Status  03/03/2020 99 0 - 99 mg/dL Final          Passed - HDL in normal range and within 360 days    HDL  Date Value Ref Range Status  03/03/2020 68 >39 mg/dL Final          Passed - Patient is not pregnant      Passed - Valid encounter within last 12 months    Recent Outpatient Visits           5 months ago Primary hypertension   [place], [name], [name]   1 year ago Pneumonia due to COVID-19 virus   [facility], [name], [name]   1 year ago Encounter for support and coordination of transition of care   [place], [place], [name]   1 year ago Acute viral syndrome   [place], [place], [name]   3 years ago Acute viral syndrome   [place], [place], [name]       Future Appointments             In 3 weeks Bacigalupo, Dionne Bucy, MD [facility]               lisinopril-hydrochlorothiazide  (ZESTORETIC) 20-12.5 MG tablet 90 tablet 0    Sig: TAKE 1 TABLET BY MOUTH EVERY DAY      Cardiovascular:  ACEI + Diuretic Combos Passed - 08/07/2020  1:23 PM      Passed - Na in normal range and within 180 days    Sodium  Date Value Ref Range Status  03/03/2020 143 134 - 144 mmol/L Final  11/28/2013 137 136 - 145 mmol/L Final          Passed - K in normal range and within 180 days    Potassium  Date Value Ref Range Status  03/03/2020 4.5 3.5 - 5.2 mmol/L Final  11/28/2013 3.9 3.5 - 5.1 mmol/L Final          Passed - Cr in normal range and within 180 days    Creatinine  Date Value Ref Range Status  11/28/2013 0.73 0.60 - 1.30 mg/dL Final   Creatinine, Ser  Date Value  Ref Range Status  03/03/2020 0.69 0.57 - 1.00 mg/dL Final    Comment:                   **Effective March 10, 2020 [facility] will begin**                  reporting the 2021 CKD-EPI creatinine equation that                  estimates kidney function without a race variable.           Passed - Ca in normal range and within 180 days    Calcium  Date Value Ref Range Status  03/03/2020 8.9 8.7 - 10.3 mg/dL Final   Calcium, Total  Date Value Ref Range Status  11/28/2013 8.0 (L) 8.5 - 10.1 mg/dL Final          Passed - Patient is not pregnant      Passed - Last BP in normal range    BP Readings from Last 1 Encounters:  03/03/20 125/80          Passed - Valid encounter within last 6 months    Recent Outpatient Visits           5 months ago Primary hypertension   [place], [name], [name]   1 year ago Pneumonia due to COVID-19 virus   [facility], [name], [name]   1 year ago Encounter for support and coordination of transition of care   [place], [name], [name]   1 year ago Acute viral syndrome   [place], [place], [name]   3 years ago Acute viral syndrome   [facility]  [place], [place], [name]       Future Appointments             In 3 weeks Bacigalupo, Dionne Bucy, MD [facility], [place]

## 2020-08-11 ENCOUNTER — Telehealth: Payer: Self-pay

## 2020-08-11 NOTE — Telephone Encounter (Signed)
Pt is wanting to speak to the office manager she is extremely frustrated that she has not received her medication and continues to keep calling to check on the status of the request since 07/29/20. Please advise [phone]- [phone]

## 2020-08-11 NOTE — Telephone Encounter (Signed)
Dr. B, there is another pending request about this possibly in your refill request box. Please review. Thanks!

## 2020-08-11 NOTE — Telephone Encounter (Signed)
This lady has been waiting on Celebrex refills since 07/29/20.   Please advise today  if she is able to get refills or needs an appt.

## 2020-08-11 NOTE — Telephone Encounter (Signed)
Rx sent. Not sure where it has been since 7/19, because I just got refill request today

## 2020-08-12 MED ORDER — CELECOXIB 200 MG PO CAPS: ORAL_CAPSULE | ORAL | 1 refills | Status: DC

## 2020-08-12 NOTE — Addendum Note (Signed)
Addended by: Ashley Royalty E on: 08/12/2020 09:54 AM   Modules accepted: Orders

## 2020-08-12 NOTE — Telephone Encounter (Signed)
The rx celebrex the transmission failed going to pharm please resend

## 2020-08-13 ENCOUNTER — Other Ambulatory Visit: Payer: Self-pay | Admitting: Physician Assistant

## 2020-08-13 DIAGNOSIS — R232 Flushing: Secondary | ICD-10-CM

## 2020-09-01 ENCOUNTER — Ambulatory Visit (INDEPENDENT_AMBULATORY_CARE_PROVIDER_SITE_OTHER): Payer: 59 | Admitting: Family Medicine

## 2020-09-01 ENCOUNTER — Ambulatory Visit
Admission: RE | Admit: 2020-09-01 | Discharge: 2020-09-01 | Disposition: A | Discharge status: Home / Self Care | Payer: 59 | Source: Ambulatory Visit | Attending: Family Medicine | Admitting: Family Medicine

## 2020-09-01 ENCOUNTER — Encounter: Payer: Self-pay | Admitting: Family Medicine

## 2020-09-01 ENCOUNTER — Other Ambulatory Visit: Payer: Self-pay

## 2020-09-01 VITALS — BP 122/74 | HR 76 | Temp 97.9°F | Ht 61.0 in | Wt 222.3 lb

## 2020-09-01 DIAGNOSIS — U099 Post covid-19 condition, unspecified: Secondary | ICD-10-CM

## 2020-09-01 DIAGNOSIS — I10 Essential (primary) hypertension: Secondary | ICD-10-CM | POA: Diagnosis not present

## 2020-09-01 DIAGNOSIS — R232 Flushing: Secondary | ICD-10-CM

## 2020-09-01 DIAGNOSIS — D229 Melanocytic nevi, unspecified: Secondary | ICD-10-CM

## 2020-09-01 DIAGNOSIS — E7800 Pure hypercholesterolemia, unspecified: Secondary | ICD-10-CM | POA: Diagnosis not present

## 2020-09-01 DIAGNOSIS — Z23 Encounter for immunization: Secondary | ICD-10-CM | POA: Diagnosis not present

## 2020-09-01 DIAGNOSIS — Z1231 Encounter for screening mammogram for malignant neoplasm of breast: Secondary | ICD-10-CM

## 2020-09-01 DIAGNOSIS — R200 Anesthesia of skin: Secondary | ICD-10-CM | POA: Diagnosis not present

## 2020-09-01 DIAGNOSIS — Z6841 Body Mass Index (BMI) 40.0 and over, adult: Secondary | ICD-10-CM

## 2020-09-01 NOTE — Assessment & Plan Note (Addendum)
-   Chronic and well-controlled - Continue Lisinopril-HCTZ, Lasix, & potassium - Recheck BMP today

## 2020-09-01 NOTE — Assessment & Plan Note (Signed)
-   Chronic, stable - Discussed diet and exercise - Discussed importance of healthy weight management

## 2020-09-01 NOTE — Assessment & Plan Note (Addendum)
-   Chronic, persistent SOB & fatigue since 1st COVID infestion - Refer to pulmonology for further workup/management - Will continue to monitor

## 2020-09-01 NOTE — Assessment & Plan Note (Signed)
-   Chronic and stable - Pt. denies acute changes in appearance of nevi - Refer to dermatology for further workup/management

## 2020-09-01 NOTE — Assessment & Plan Note (Addendum)
-   Chronic, uncomplicated problem - Bilateral carpal tunnel syndrome vs. positional numbness - Counseled pt. to support hands while using phone for extended periods of time - Denies exacerbation of symptoms at night, but may consider wrist splinting at night if symptoms persist or worsen

## 2020-09-01 NOTE — Progress Notes (Signed)
Established patient visit   Patient: Leslie MELBYE   DOB: [birth year]   65 y.o. Female  MRN: [id number] Visit Date: 09/01/2020  Today's healthcare provider: Lavon Paganini, MD   Chief Complaint  Patient presents with   Follow-up        Hypertension   Subjective    Hypertension - Currently taking Lisinopril-HCTZ & Lasix; denies side effects - Denies chest pain, leg swelling, headaches, vision changes  Hypercholesterolemia - Currently taking simvastatin - Endorses occasional myalgias, worse after a long day of exertion/working outside  Numbness in both hands  - Pt. notes intermittent numbness in first 3 fingers of both hands, progressive over the past month - Pt. states that she notices symptoms after holding up her phone for several minutes - Denies wrist pain, denies worsening symptoms at night  Multiple nevi - Pt. reports concerns of multiple nevi for the past several years - States that she hasn't seen a dermatologist "in a long time" but requests referral today  Post-COVID Viral Sequelae - Pt. states that she had COVID in Jan 21 & May 22; hospitalized for PNA & respiratory distress 2/2 COVID in Jan 21 - Endorses persistent SOB & fatigue since COVID in Jan 21 - Denies persistent cough, congestion, & fevers  Health maintenance - Due for Tdap booster - Pt. requests new mammo referral     Medications: Outpatient Medications Prior to Visit  Medication Sig   aspirin EC 81 MG tablet Take 81 mg by mouth daily.    celecoxib (CELEBREX) 200 MG capsule TAKE 1 CAPSULE BY MOUTH EVERY DAY   cetirizine (ZYRTEC) 10 MG tablet Take 10 mg by mouth daily.    Cholecalciferol (VITAMIN D3) 1000 units CAPS Take 1,000 Units by mouth in the morning and at bedtime.   estradiol (ESTRACE) 1 MG tablet TAKE 1 TABLET (1 MG TOTAL) BY MOUTH DAILY. PLEASE SCHEDULE AN OFFICE VISIT BEFORE ANYMORE REFILLS.   fluticasone (FLONASE) 50 MCG/ACT nasal spray Place 2 sprays into both nostrils  daily.   furosemide (LASIX) 40 MG tablet Take 1 tablet (40 mg total) by mouth 2 (two) times daily as needed. (Patient taking differently: Take 40 mg by mouth daily.)   Glucosamine-Chondroitin 750-600 MG TABS Take 1 tablet by mouth in the morning and at bedtime.   lisinopril-hydrochlorothiazide (ZESTORETIC) 20-12.5 MG tablet TAKE 1 TABLET BY MOUTH EVERY DAY   Omega-3 1000 MG CAPS Take 2,000 mg by mouth daily.   oxymetazoline (AFRIN) 0.05 % nasal spray Place 1-2 sprays into both nostrils 2 (two) times daily as needed for congestion.   simvastatin (ZOCOR) 40 MG tablet TAKE 1 TABLET BY MOUTH EVERYDAY AT BEDTIME   valACYclovir (VALTREX) 1000 MG tablet TAKE 1-2 TABLETS (1,000-2,000 MG TOTAL) BY MOUTH EVERY 12 (TWELVE) HOURS AS NEEDED.   Calcium Carbonate-Vitamin D (CALCIUM 500/D PO) Take 1 tablet by mouth daily.  (Patient not taking: Reported on 09/01/2020)   Multiple Vitamin (MULTIVITAMIN) tablet Take 1 tablet by mouth daily.  (Patient not taking: Reported on 09/01/2020)   potassium chloride (K-DUR) 10 MEQ tablet Take 1 tablet (10 mEq total) by mouth 2 (two) times daily as needed. (Patient not taking: Reported on 09/01/2020)   senna-docusate (SENOKOT-S) 8.6-50 MG tablet Take 1 tablet by mouth at bedtime. (Patient not taking: No sig reported)   No facility-administered medications prior to visit.    Review of Systems  Constitutional:  Positive for fatigue. Negative for activity change, appetite change, chills and fever.  Eyes: Negative.  Respiratory:  Positive for shortness of breath. Negative for cough and chest tightness.   Cardiovascular: Negative.  Negative for chest pain and leg swelling.  Gastrointestinal: Negative.   Endocrine: Negative.   Genitourinary: Negative.   Musculoskeletal:  Positive for arthralgias and back pain.  Skin: Negative.   Neurological:  Positive for numbness. Negative for weakness and light-headedness.  Psychiatric/Behavioral: Negative.        Objective    BP 122/74  (BP Location: Right Arm, Patient Position: Sitting, Cuff Size: Large)   Pulse 76   Temp 97.9 F (36.6 C) (Oral)   Ht '5\' 1"'$  (1.549 m)   Wt 222 lb 4.8 oz (100.8 kg)   SpO2 99%   BMI 42.00 kg/m     Physical Exam Constitutional:      Appearance: Normal appearance. She is obese.  HENT:     Head: Normocephalic and atraumatic.     Right Ear: External ear normal.     Left Ear: External ear normal.  Eyes:     Conjunctiva/sclera: Conjunctivae normal.  Cardiovascular:     Rate and Rhythm: Normal rate and regular rhythm.     Pulses: Normal pulses.     Heart sounds: Normal heart sounds.  Pulmonary:     Effort: Pulmonary effort is normal.     Breath sounds: Normal breath sounds.  Abdominal:     General: Abdomen is flat. Bowel sounds are normal.     Palpations: Abdomen is soft.  Skin:    General: Skin is warm and dry.     Comments: Multiple ~2 mm seborrheic keratoses on upper back Multiple nevi scattered across skin surfaces  Neurological:     Mental Status: She is alert.  Psychiatric:        Behavior: Behavior normal.        Thought Content: Thought content normal.     No results found for any visits on 09/01/20.  Assessment & Plan     Problem List Items Addressed This Visit       Cardiovascular and Mediastinum   Essential hypertension - Primary    - Chronic and well-controlled - Continue Lisinopril-HCTZ, Lasix, & potassium - Recheck BMP today      Relevant Orders   Basic Metabolic Panel (BMET)     Other   Hypercholesteremia    - Chronic and stable - Continue simvastatin - Recommended pt. to maintain adequate hydration during exertion to help prevent myalgias      Bilateral hand numbness    - Chronic, uncomplicated problem - Bilateral carpal tunnel syndrome vs. positional numbness - Counseled pt. to support hands while using phone for extended periods of time - Denies exacerbation of symptoms at night, but may consider wrist splinting at night if symptoms  persist or worsen      Morbid obesity ([place])    - Chronic, stable - Discussed diet and exercise - Discussed importance of healthy weight management      Long COVID    - Chronic, persistent SOB & fatigue since 1st COVID infestion - Refer to pulmonology for further workup/management - Will continue to monitor      Relevant Orders   Ambulatory referral to Pulmonology   Multiple nevi    - Chronic and stable - Pt. denies acute changes in appearance of nevi - Refer to dermatology for further workup/management      Relevant Orders   Ambulatory referral to Dermatology   Other Visit Diagnoses     Health Maintenance   -  Tdap booster    Breast cancer screening by mammogram       Relevant Orders   MM 3D SCREEN BREAST BILATERAL        Return in about 6 months (around 03/04/2021) for CPE.        Percell Locus, MS3    Patient seen along with MS3 student Percell Locus. I personally evaluated this patient along with the student, and verified all aspects of the history, physical exam, and medical decision making as documented by the student. I agree with the student's documentation and have made all necessary edits.  Janey Petron, Dionne Bucy, MD, MPH [facility]

## 2020-09-01 NOTE — Assessment & Plan Note (Signed)
-   Chronic and stable - Continue simvastatin - Recommended pt. to maintain adequate hydration during exertion to help prevent myalgias

## 2020-09-02 LAB — BASIC METABOLIC PANEL
BUN/Creatinine Ratio: 18 (ref 12–28)
BUN: 12 mg/dL (ref 8–27)
CO2: 26 mmol/L (ref 20–29)
Calcium: 8.9 mg/dL (ref 8.7–10.3)
Chloride: 103 mmol/L (ref 96–106)
Creatinine, Ser: 0.68 mg/dL (ref 0.57–1.00)
Glucose: 79 mg/dL (ref 65–99)
Potassium: 4.8 mmol/L (ref 3.5–5.2)
Sodium: 141 mmol/L (ref 134–144)
eGFR: 97 mL/min/{1.73_m2} (ref >59)

## 2020-10-16 ENCOUNTER — Other Ambulatory Visit: Payer: Self-pay | Admitting: Family Medicine

## 2020-10-16 DIAGNOSIS — I10 Essential (primary) hypertension: Secondary | ICD-10-CM

## 2020-10-16 NOTE — Telephone Encounter (Signed)
Requested Prescriptions  Pending Prescriptions Disp Refills  . lisinopril-hydrochlorothiazide (ZESTORETIC) 20-12.5 MG tablet [Pharmacy Med Name: LISINOPRIL-HCTZ 20-12.5 MG TAB] 90 tablet 0    Sig: TAKE 1 TABLET BY MOUTH EVERY DAY     Cardiovascular:  ACEI + Diuretic Combos Passed - 10/16/2020  7:41 AM      Passed - Na in normal range and within 180 days    Sodium  Date Value Ref Range Status  09/01/2020 141 134 - 144 mmol/L Final  11/28/2013 137 136 - 145 mmol/L Final         Passed - K in normal range and within 180 days    Potassium  Date Value Ref Range Status  09/01/2020 4.8 3.5 - 5.2 mmol/L Final  11/28/2013 3.9 3.5 - 5.1 mmol/L Final         Passed - Cr in normal range and within 180 days    Creatinine  Date Value Ref Range Status  11/28/2013 0.73 0.60 - 1.30 mg/dL Final   Creatinine, Ser  Date Value Ref Range Status  09/01/2020 0.68 0.57 - 1.00 mg/dL Final         Passed - Ca in normal range and within 180 days    Calcium  Date Value Ref Range Status  09/01/2020 8.9 8.7 - 10.3 mg/dL Final   Calcium, Total  Date Value Ref Range Status  11/28/2013 8.0 (L) 8.5 - 10.1 mg/dL Final         Passed - Patient is not pregnant      Passed - Last BP in normal range    BP Readings from Last 1 Encounters:  09/01/20 122/74         Passed - Valid encounter within last 6 months    Recent Outpatient Visits          1 month ago Essential hypertension   [place], Dionne Bucy, MD   7 months ago Primary hypertension   [place], [name], [name]   1 year ago Pneumonia due to COVID-19 virus   [facility], [name], [name]   1 year ago Encounter for support and coordination of transition of care   [place], [name], [name]   1 year ago Acute viral syndrome   [place], [name], [name]      Future Appointments            In 4 months Gwyneth Sprout, [place], [place]   In 4 months Ralene Bathe, MD [place]

## 2020-11-03 ENCOUNTER — Ambulatory Visit (INDEPENDENT_AMBULATORY_CARE_PROVIDER_SITE_OTHER): Payer: 59 | Admitting: Internal Medicine

## 2020-11-03 ENCOUNTER — Other Ambulatory Visit: Payer: Self-pay

## 2020-11-03 ENCOUNTER — Other Ambulatory Visit: Payer: Self-pay | Admitting: Family Medicine

## 2020-11-03 ENCOUNTER — Encounter: Payer: Self-pay | Admitting: Internal Medicine

## 2020-11-03 ENCOUNTER — Ambulatory Visit
Admission: RE | Admit: 2020-11-03 | Discharge: 2020-11-03 | Disposition: A | Discharge status: Home / Self Care | Payer: 59 | Source: Ambulatory Visit | Attending: Internal Medicine | Admitting: Internal Medicine

## 2020-11-03 VITALS — BP 128/76 | HR 88 | Temp 97.3°F | Ht 61.0 in | Wt 222.0 lb

## 2020-11-03 DIAGNOSIS — E7800 Pure hypercholesterolemia, unspecified: Secondary | ICD-10-CM

## 2020-11-03 DIAGNOSIS — R0602 Shortness of breath: Secondary | ICD-10-CM

## 2020-11-03 DIAGNOSIS — U099 Post covid-19 condition, unspecified: Secondary | ICD-10-CM

## 2020-11-03 LAB — POCT I-STAT CREATININE: Creatinine, Ser: 0.8 mg/dL (ref 0.44–1.00)

## 2020-11-03 MED ORDER — ADVAIR HFA 230-21 MCG/ACT IN AERO: 2.0000 | INHALATION_SPRAY | Freq: Two times a day (BID) | RESPIRATORY_TRACT | 12 refills | Status: DC

## 2020-11-03 MED ORDER — IOHEXOL 350 MG/ML SOLN
75.0000 mL | Freq: Once | INTRAVENOUS | Status: AC | PRN
  Administered 2020-11-03: 75 mL via INTRAVENOUS

## 2020-11-03 NOTE — Telephone Encounter (Signed)
Requested Prescriptions  Pending Prescriptions Disp Refills  . simvastatin (ZOCOR) 40 MG tablet [Pharmacy Med Name: SIMVASTATIN 40 MG TABLET] 90 tablet 0    Sig: TAKE 1 TABLET BY MOUTH EVERYDAY AT BEDTIME     Cardiovascular:  Antilipid - Statins Failed - 11/03/2020  8:49 AM      Failed - Triglycerides in normal range and within 360 days    Triglycerides  Date Value Ref Range Status  03/03/2020 172 (H) 0 - 149 mg/dL Final         Passed - Total Cholesterol in normal range and within 360 days    Cholesterol, Total  Date Value Ref Range Status  03/03/2020 196 100 - 199 mg/dL Final         Passed - LDL in normal range and within 360 days    LDL Chol Calc (NIH)  Date Value Ref Range Status  03/03/2020 99 0 - 99 mg/dL Final         Passed - HDL in normal range and within 360 days    HDL  Date Value Ref Range Status  03/03/2020 68 >39 mg/dL Final         Passed - Patient is not pregnant      Passed - Valid encounter within last 12 months    Recent Outpatient Visits          2 months ago Essential hypertension   [place], Dionne Bucy, MD   8 months ago Primary hypertension   [place], [name], [name]   1 year ago Pneumonia due to COVID-19 virus   [facility], [name], [name]   1 year ago Encounter for support and coordination of transition of care   [place], [name], [name]   1 year ago Acute viral syndrome   [place], [name], [name]      Future Appointments            In 4 months Gwyneth Sprout, [place], [place]   In 4 months Ralene Bathe, MD [place]

## 2020-11-03 NOTE — Patient Instructions (Signed)
Obtain CT chest to assess for PE and Lung tissue Obtain PFT's to assess Lung function Start ADVAIR 230 use as directed

## 2020-11-03 NOTE — Progress Notes (Signed)
[facility] Consultation      Date: 11/03/2020,   MRN# [id number] Leslie Koch 12-May-1955    CHIEF COMPLAINT:   SOB and DOE   HISTORY OF PRESENT ILLNESS   65 year old pleasant white female seen today for ongoing shortness of breath and dyspnea exertion  January 2021 COVID-19 infection admitted to [facility] for 1 week of admission for severe COVID-pneumonia Ever since discharge she has been having problems with breathing mostly had brain fog and had to retire at that time  Patient developed another COVID-19 infection in May 2022 Patient has progressive shortness of breath and dyspnea on exertion She has increased work of breathing intermittent cough with wheezing  She has not tried any inhalers in the past  She is a non-smoker Nonalcoholic Previous smoker quit 1997 1/2 pack/day for 30 years  No exacerbation at this time No evidence of heart failure at this time No evidence or signs of infection at this time No respiratory distress No fevers, chills, nausea, vomiting, diarrhea No evidence of lower extremity edema No evidence hemoptysis    PAST MEDICAL HISTORY   Past Medical History:  Diagnosis Date  . Arthritis   . GERD (gastroesophageal reflux disease)   . Hypertension   . PONV (postoperative nausea and vomiting)      SURGICAL HISTORY   Past Surgical History:  Procedure Laterality Date  . ABDOMINAL HYSTERECTOMY  01/1999  . CYST EXCISION  1974  . KNEE SURGERY Right 07/2010  . 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 TOTAL KNEE ARTHROPLASTY WITH REVISION COMPONENTS Right 02/11/2016   Procedure: RIGHT TOTAL KNEE ARTHROPLASTY REVISION;  Surgeon: Gaynelle Arabian, MD;  Location: [facility];  Service: Orthopedics;  Laterality: Right;  . TUBAL LIGATION  1984     FAMILY HISTORY   Family History  Problem Relation Age of Onset  . Hypertension Mother   . Diabetes Mother   . Coronary artery disease Mother   . Lung cancer Father   .  Stroke Maternal Grandmother   . Coronary artery disease Maternal Grandfather   . Breast cancer Paternal Grandmother      SOCIAL HISTORY   Social History   Tobacco Use  . Smoking status: Former    Packs/day: 2.00    Years: 30.00    Pack years: 60.00    Types: Cigarettes    Quit date: 04/14/1995    Years since quitting: 25.5  . Smokeless tobacco: Never  . Tobacco comments:    quit 04/14/1995  Vaping Use  . Vaping Use: Never used  Substance Use Topics  . Alcohol use: Yes    Alcohol/week: 0.0 standard drinks    Comment: occasionally 2-3 drinks once or twice a month  . Drug use: No     MEDICATIONS    Home Medication:  Current Outpatient Rx  . Order #: [id number] Class: Historical Med  . Order #: [id number] Class: Historical Med  . Order #: [id number] Class: Normal  . Order #: [id number] Class: Historical Med  . Order #: [id number] Class: Historical Med  . Order #: [id number] Class: Normal  . Order #: [id number] Class: Historical Med  . Order #: [id number] Class: Normal  . Order #: [id number] Class: Historical Med  . Order #: [id number] Class: Normal  . Order #: [id number] Class: Historical Med  . Order #: [id number] Class: Historical Med  . Order #: [id number] Class: Historical Med  . Order #: [id number] Class: Normal  . Order #: [id number] Class: Historical Med  . Order #: [id number] Class: Normal  . Order #: [id number] Class: Normal  Current Medication:  Current Outpatient Medications:  .  aspirin EC 81 MG tablet, Take 81 mg by mouth daily. , Disp: , Rfl:  .  Calcium Carbonate-Vitamin D (CALCIUM 500/D PO), Take 1 tablet by mouth daily., Disp: , Rfl:  .  celecoxib (CELEBREX) 200 MG capsule, TAKE 1 CAPSULE BY MOUTH EVERY DAY, Disp: 90 capsule, Rfl: 1 .  cetirizine (ZYRTEC) 10 MG tablet, Take 10 mg by mouth daily. , Disp: , Rfl:  .  Cholecalciferol (VITAMIN D3) 1000 units CAPS, Take 1,000 Units by mouth in the morning and at bedtime., Disp: , Rfl:  .  estradiol (ESTRACE) 1 MG tablet, TAKE 1  TABLET (1 MG TOTAL) BY MOUTH DAILY. PLEASE SCHEDULE AN OFFICE VISIT BEFORE ANYMORE REFILLS., Disp: 90 tablet, Rfl: 1 .  fluticasone (FLONASE) 50 MCG/ACT nasal spray, Place 2 sprays into both nostrils daily., Disp: , Rfl:  .  furosemide (LASIX) 40 MG tablet, Take 1 tablet (40 mg total) by mouth 2 (two) times daily as needed. (Patient taking differently: Take 40 mg by mouth daily.), Disp: 180 tablet, Rfl: 1 .  Glucosamine-Chondroitin 750-600 MG TABS, Take 1 tablet by mouth in the morning and at bedtime., Disp: , Rfl:  .  lisinopril-hydrochlorothiazide (ZESTORETIC) 20-12.5 MG tablet, TAKE 1 TABLET BY MOUTH EVERY DAY, Disp: 90 tablet, Rfl: 0 .  Multiple Vitamin (MULTIVITAMIN) tablet, Take 1 tablet by mouth daily., Disp: , Rfl:  .  Omega-3 1000 MG CAPS, Take 2,000 mg by mouth daily., Disp: , Rfl:  .  oxymetazoline (AFRIN) 0.05 % nasal spray, Place 1-2 sprays into both nostrils 2 (two) times daily as needed for congestion., Disp: , Rfl:  .  potassium chloride (K-DUR) 10 MEQ tablet, Take 1 tablet (10 mEq total) by mouth 2 (two) times daily as needed., Disp: 60 tablet, Rfl: 4 .  senna-docusate (SENOKOT-S) 8.6-50 MG tablet, Take 1 tablet by mouth at bedtime., Disp: , Rfl:  .  simvastatin (ZOCOR) 40 MG tablet, TAKE 1 TABLET BY MOUTH EVERYDAY AT BEDTIME, Disp: 90 tablet, Rfl: 0 .  valACYclovir (VALTREX) 1000 MG tablet, TAKE 1-2 TABLETS (1,000-2,000 MG TOTAL) BY MOUTH EVERY 12 (TWELVE) HOURS AS NEEDED., Disp: 28 tablet, Rfl: 0    ALLERGIES   Augmentin [amoxicillin-pot clavulanate] and Montelukast sodium     REVIEW OF SYSTEMS     Review of Systems:  Gen:  Denies  fever, sweats, chills weight loss  HEENT: Denies blurred vision, double vision, ear pain, eye pain, hearing loss, nose bleeds, sore throat Cardiac:  No dizziness, chest pain or heaviness, chest tightness,edema, No JVD Resp:   No cough, -sputum production, -shortness of breath,-wheezing, -hemoptysis,  Gi: Denies swallowing difficulty,  stomach pain, nausea or vomiting, diarrhea, constipation, bowel incontinence Gu:  Denies bladder incontinence, burning urine Ext:   Denies Joint pain, stiffness or swelling Skin: Denies  skin rash, easy bruising or bleeding or hives Endoc:  Denies polyuria, polydipsia , polyphagia or weight change Psych:   Denies depression, insomnia or hallucinations  Other:  All other systems negative   Other:  All other systems negative   VS: BP 128/76 (BP Location: Left Arm, Cuff Size: Large)   Pulse 88   Temp (!) 97.3 F (36.3 C) (Temporal)   Ht 5\' 1"  (1.549 m)   Wt 222 lb (100.7 kg)   SpO2 98%   BMI 41.95 kg/m       Physical Examination:   General Appearance: No distress  EYES PERRLA, EOM intact.   NECK Supple, No JVD  Pulmonary: normal breath sounds, No wheezing.  CardiovascularNormal S1,S2.  No m/r/g.   Abdomen: Benign, Soft, non-tender. Skin:   warm, no rashes, no ecchymosis  Extremities: normal, no cyanosis, clubbing. Neuro:without focal findings,  speech normal  PSYCHIATRIC: Mood, affect within normal limits.   ALL OTHER ROS ARE NEGATIVE    ASSESSMENT/PLAN   65 year old pleasant white female with morbid obesity with 2 bouts of COVID-19 infection over the last several years with increased work of breathing shortness of breath and dyspnea on exertion patient will need CT of the chest to assess for PE and also to assess lung function  #1 obtain CT of the chest to assess for PE #2 obtain pulmonary function testing to assess lung function #3 start Advair HFA   MEDICATION ADJUSTMENTS/LABS AND TESTS ORDERED: Obtain CT chest to assess for PE and Lung tissue Obtain PFT's to assess Lung function Start ADVAIR 230 use as directed   CURRENT MEDICATIONS REVIEWED AT LENGTH WITH PATIENT TODAY   Patient  satisfied with Plan of action and management. All questions answered  Follow up 6 months  Total Time Spent  35 mins   Maretta Bees Patricia Pesa, M.D.  [facility]  Medical Director [place] Director [facility] Cardio-Pulmonary Department

## 2020-11-07 ENCOUNTER — Telehealth: Payer: Self-pay

## 2020-11-07 NOTE — Telephone Encounter (Signed)
Called and spoke to patient about upcoming covid test, pt had a clear understanding nothing further needed.

## 2020-11-10 ENCOUNTER — Other Ambulatory Visit: Payer: Self-pay

## 2020-11-10 ENCOUNTER — Other Ambulatory Visit
Admission: RE | Admit: 2020-11-10 | Discharge: 2020-11-10 | Disposition: A | Discharge status: Home / Self Care | Payer: 59 | Source: Ambulatory Visit | Attending: Internal Medicine | Admitting: Internal Medicine

## 2020-11-10 DIAGNOSIS — Z01812 Encounter for preprocedural laboratory examination: Secondary | ICD-10-CM | POA: Diagnosis present

## 2020-11-10 DIAGNOSIS — Z20822 Contact with and (suspected) exposure to covid-19: Secondary | ICD-10-CM | POA: Diagnosis not present

## 2020-11-11 ENCOUNTER — Ambulatory Visit: Payer: 59 | Attending: Internal Medicine

## 2020-11-11 DIAGNOSIS — U099 Post covid-19 condition, unspecified: Secondary | ICD-10-CM | POA: Insufficient documentation

## 2020-11-11 DIAGNOSIS — R0602 Shortness of breath: Secondary | ICD-10-CM | POA: Insufficient documentation

## 2020-11-11 LAB — SARS CORONAVIRUS 2 (TAT 6-24 HRS): SARS Coronavirus 2: NEGATIVE

## 2020-11-11 MED ORDER — ALBUTEROL SULFATE (2.5 MG/3ML) 0.083% IN NEBU
2.5000 mg | INHALATION_SOLUTION | Freq: Once | RESPIRATORY_TRACT | Status: AC
  Administered 2020-11-11: 2.5 mg via RESPIRATORY_TRACT
  Filled 2020-11-11: qty 3

## 2020-12-16 ENCOUNTER — Other Ambulatory Visit: Payer: Self-pay | Admitting: Family Medicine

## 2020-12-16 DIAGNOSIS — M2550 Pain in unspecified joint: Secondary | ICD-10-CM

## 2020-12-16 DIAGNOSIS — R232 Flushing: Secondary | ICD-10-CM

## 2020-12-16 DIAGNOSIS — I10 Essential (primary) hypertension: Secondary | ICD-10-CM

## 2020-12-16 NOTE — Telephone Encounter (Signed)
Requested Prescriptions  Pending Prescriptions Disp Refills  . celecoxib (CELEBREX) 200 MG capsule [Pharmacy Med Name: CELECOXIB 200 MG CAPSULE] 90 capsule 1    Sig: TAKE 1 CAPSULE BY MOUTH EVERY DAY     Analgesics:  COX2 Inhibitors Passed - 12/16/2020  8:05 PM      Passed - HGB in normal range and within 360 days    Hemoglobin  Date Value Ref Range Status  03/03/2020 14.1 11.1 - 15.9 g/dL Final         Passed - Cr in normal range and within 360 days    Creatinine  Date Value Ref Range Status  11/28/2013 0.73 0.60 - 1.30 mg/dL Final   Creatinine, Ser  Date Value Ref Range Status  11/03/2020 0.80 0.44 - 1.00 mg/dL Final         Passed - Patient is not pregnant      Passed - Valid encounter within last 12 months    Recent Outpatient Visits          3 months ago Essential hypertension   [place], Dionne Bucy, [place]   9 months ago Primary hypertension   [place], [name], [name]   1 year ago Pneumonia due to COVID-19 virus   [facility], [name], [name]   1 year ago Encounter for support and coordination of transition of care   [place], [name], [name]   1 year ago Acute viral syndrome   [place], [name], [name]      Future Appointments            In 2 months Gwyneth Sprout, [place], [place]   In 2 months Ralene Bathe, MD [place]           . estradiol (ESTRACE) 1 MG tablet [Pharmacy Med Name: ESTRADIOL 1 MG TABLET] 90 tablet 1    Sig: TAKE 1 TABLET (1 MG TOTAL) BY MOUTH DAILY. PLEASE SCHEDULE AN OFFICE VISIT BEFORE ANYMORE REFILLS.     OB/GYN:  Estrogens Passed - 12/16/2020  8:05 PM      Passed - Mammogram is up-to-date per Health Maintenance      Passed - Last BP in normal range    BP Readings from Last 1 Encounters:  11/03/20 128/76         Passed - Valid encounter within last 12 months     Recent Outpatient Visits          3 months ago Essential hypertension   [facility] [place], Dionne Bucy, MD   9 months ago Primary hypertension   [place], [name], [name]   1 year ago Pneumonia due to COVID-19 virus   [facility], [name], [name]   1 year ago Encounter for support and coordination of transition of care   [place], [name], [name]   1 year ago Acute viral syndrome   [facility], [name], [name]      Future Appointments            In 2 months Gwyneth Sprout, [place], [place]   In 2 months Ralene Bathe, MD [place]

## 2021-03-04 ENCOUNTER — Encounter: Payer: 59 | Admitting: Family Medicine

## 2021-03-11 ENCOUNTER — Ambulatory Visit: Payer: 59 | Admitting: Dermatology

## 2021-03-21 ENCOUNTER — Other Ambulatory Visit: Payer: Self-pay | Admitting: Physician Assistant

## 2021-03-21 DIAGNOSIS — R600 Localized edema: Secondary | ICD-10-CM

## 2021-04-03 ENCOUNTER — Other Ambulatory Visit: Payer: Self-pay | Admitting: Family Medicine

## 2021-04-03 DIAGNOSIS — E7800 Pure hypercholesterolemia, unspecified: Secondary | ICD-10-CM

## 2021-04-27 ENCOUNTER — Ambulatory Visit (INDEPENDENT_AMBULATORY_CARE_PROVIDER_SITE_OTHER): Payer: Medicare HMO | Admitting: Dermatology

## 2021-04-27 DIAGNOSIS — I8393 Asymptomatic varicose veins of bilateral lower extremities: Secondary | ICD-10-CM | POA: Diagnosis not present

## 2021-04-27 DIAGNOSIS — D1800 Hemangioma unspecified site: Secondary | ICD-10-CM

## 2021-04-27 DIAGNOSIS — D229 Melanocytic nevi, unspecified: Secondary | ICD-10-CM | POA: Diagnosis not present

## 2021-04-27 DIAGNOSIS — L821 Other seborrheic keratosis: Secondary | ICD-10-CM

## 2021-04-27 DIAGNOSIS — Z1283 Encounter for screening for malignant neoplasm of skin: Secondary | ICD-10-CM | POA: Diagnosis not present

## 2021-04-27 DIAGNOSIS — L814 Other melanin hyperpigmentation: Secondary | ICD-10-CM | POA: Diagnosis not present

## 2021-04-27 DIAGNOSIS — L578 Other skin changes due to chronic exposure to nonionizing radiation: Secondary | ICD-10-CM | POA: Diagnosis not present

## 2021-04-27 DIAGNOSIS — L820 Inflamed seborrheic keratosis: Secondary | ICD-10-CM

## 2021-04-27 DIAGNOSIS — L918 Other hypertrophic disorders of the skin: Secondary | ICD-10-CM

## 2021-04-27 NOTE — Patient Instructions (Addendum)

## 2021-04-27 NOTE — Progress Notes (Addendum)
? ?New Patient Visit ? ?Subjective  ?Leslie Koch is a 66 y.o. female who presents for the following: Annual Exam (New patient - No history of skin cancer or abnormal moles - TBSE today). ?The patient presents for Total-Body Skin Exam (TBSE) for skin cancer screening and mole check.  The patient has spots, moles and lesions to be evaluated, some may be new or changing and the patient has concerns that these could be cancer. ?Pt complains of irritating lesions of neck. ? ?The following portions of the chart were reviewed this encounter and updated as appropriate:  ? Tobacco  Allergies  Meds  Problems  Med Hx  Surg Hx  Fam Hx   ?  ?Review of Systems:  No other skin or systemic complaints except as noted in HPI or Assessment and Plan. ? ?Objective  ?Well appearing patient in no apparent distress; mood and affect are within normal limits. ? ?A full examination was performed including scalp, head, eyes, ears, nose, lips, neck, chest, axillae, abdomen, back, buttocks, bilateral upper extremities, bilateral lower extremities, hands, feet, fingers, toes, fingernails, and toenails. All findings within normal limits unless otherwise noted below. ? ?Right post shoulder x 1, right hip x 2, right cheek x 1, right side x 1, left neck x 2, right calf x 1 (8) ?Erythematous stuck-on, waxy papule or plaque ? ?Legs ?Spider veins ? ?Right post neck x 1 ?Fleshy, skin-colored pedunculated papules.   ? ? ?Assessment & Plan  ? ?Lentigines ?- Scattered tan macules ?- Due to sun exposure ?- Benign-appearing, observe ?- Recommend daily broad spectrum sunscreen SPF 30+ to sun-exposed areas, reapply every 2 hours as needed. ?- Call for any changes ? ?Seborrheic Keratoses ?- Stuck-on, waxy, tan-brown papules and/or plaques  ?- Benign-appearing ?- Discussed benign etiology and prognosis. ?- Observe ?- Call for any changes ? ?Melanocytic Nevi ?- Tan-brown and/or pink-flesh-colored symmetric macules and papules ?- Benign appearing on exam  today ?- Observation ?- Call clinic for new or changing moles ?- Recommend daily use of broad spectrum spf 30+ sunscreen to sun-exposed areas.  ? ?Hemangiomas ?- Red papules ?- Discussed benign nature ?- Observe ?- Call for any changes ? ?Actinic Damage ?- Chronic condition, secondary to cumulative UV/sun exposure ?- diffuse scaly erythematous macules with underlying dyspigmentation ?- Recommend daily broad spectrum sunscreen SPF 30+ to sun-exposed areas, reapply every 2 hours as needed.  ?- Staying in the shade or wearing long sleeves, sun glasses (UVA+UVB protection) and wide brim hats (4-inch brim around the entire circumference of the hat) are also recommended for sun protection.  ?- Call for new or changing lesions. ? ?Skin cancer screening performed today. ? ?Inflamed seborrheic keratosis (8) ?irritated ?Right post shoulder x 1, right hip x 2, right cheek x 1, right side x 1, left neck x 2, right calf x 1 ?Destruction of lesion - Right post shoulder x 1, right hip x 2, right cheek x 1, right side x 1, left neck x 2, right calf x 1 ?Complexity: simple   ?Destruction method: cryotherapy   ?Informed consent: discussed and consent obtained   ?Timeout:  patient name, date of birth, surgical site, and procedure verified ?Lesion destroyed using liquid nitrogen: Yes   ?Region frozen until ice ball extended beyond lesion: Yes   ?Outcome: patient tolerated procedure well with no complications   ?Post-procedure details: wound care instructions given   ? ?Spider veins of both lower extremities ?Legs ?Benign ? ?Skin tag ?Right post neck x 1 ?  Epidermal / dermal shaving - Right post neck x 1 ?irritating ?Informed consent: discussed and consent obtained   ?Timeout: patient name, date of birth, surgical site, and procedure verified   ?Procedure prep:  Patient was prepped and draped in usual sterile fashion ?Prep type:  Isopropyl alcohol ?Anesthesia: the lesion was anesthetized in a standard fashion   ?Anesthetic:  1% lidocaine  w/ epinephrine 1-100,000 buffered w/ 8.4% NaHCO3 ?Instrument used: flexible razor blade   ?Hemostasis achieved with: pressure, aluminum chloride and electrodesiccation   ?Outcome: patient tolerated procedure well   ?Post-procedure details: sterile dressing applied and wound care instructions given   ?Dressing type: bandage and petrolatum   ? ?Return if symptoms worsen or fail to improve. ? ?I, Ashok Cordia, CMA, am acting as scribe for Sarina Ser, MD . ?Documentation: I have reviewed the above documentation for accuracy and completeness, and I agree with the above. ? ?Sarina Ser, MD ? ? ?

## 2021-04-30 ENCOUNTER — Other Ambulatory Visit: Payer: Self-pay | Admitting: Family Medicine

## 2021-04-30 DIAGNOSIS — E7800 Pure hypercholesterolemia, unspecified: Secondary | ICD-10-CM

## 2021-04-30 DIAGNOSIS — R600 Localized edema: Secondary | ICD-10-CM

## 2021-04-30 NOTE — Telephone Encounter (Signed)
Requested medication (s) are due for refill today: na ? ?Requested medication (s) are on the active medication list: yes ? ?Last refill:  zocor- 04/03/21 #30 0 refills, lasix- 03/30/21 #120 0 refills ? ?Future visit scheduled: yes in 5 days  ? ?Notes to clinic:  do you want to give courtesy refill for 5 days supply or can patient get #30 day supply of Rx x 2? ? ? ?  ?Requested Prescriptions  ?Pending Prescriptions Disp Refills  ? simvastatin (ZOCOR) 40 MG tablet [Pharmacy Med Name: SIMVASTATIN 40 MG TABLET] 30 tablet 0  ?  Sig: TAKE 1 TABLET BY MOUTH EVERYDAY AT BEDTIME  ?  ? Cardiovascular:  Antilipid - Statins Failed - 04/30/2021  9:35 AM  ?  ?  Failed - Lipid Panel in normal range within the last 12 months  ?  Cholesterol, Total  ?Date Value Ref Range Status  ?03/03/2020 196 100 - 199 mg/dL Final  ? ?LDL Chol Calc (NIH)  ?Date Value Ref Range Status  ?03/03/2020 99 0 - 99 mg/dL Final  ? ?HDL  ?Date Value Ref Range Status  ?03/03/2020 68 >39 mg/dL Final  ? ?Triglycerides  ?Date Value Ref Range Status  ?03/03/2020 172 (H) 0 - 149 mg/dL Final  ? ?  ?  ?  Passed - Patient is not pregnant  ?  ?  Passed - Valid encounter within last 12 months  ?  Recent Outpatient Visits   ? ?      ? 8 months ago Essential hypertension  ? [facility] [place], Dionne Bucy, [place]  ? 1 year ago Primary hypertension  ? [place], [name]  ? 2 years ago Pneumonia due to COVID-19 virus  ? [place], [name]  ? 2 years ago Encounter for support and coordination of transition of care  ? [place], [name]  ? 2 years ago Acute viral syndrome  ? [place], [name]  ? ?  ?  ?Future Appointments   ? ?        ? In 5 days Gwyneth Sprout, [employer], [facility]  ? ?  ? ? ?  ?  ?  ? furosemide (LASIX) 40 MG tablet [Pharmacy Med Name: FUROSEMIDE 40 MG TABLET] 60 tablet 1  ?  Sig: TAKE 1 TABLET  BY MOUTH TWICE A DAY AS NEEDED  ?  ? Cardiovascular:  Diuretics - Loop Failed - 04/30/2021  9:35 AM  ?  ?  Failed - K in normal range and within 180 days  ?  Potassium  ?Date Value Ref Range Status  ?09/01/2020 4.8 3.5 - 5.2 mmol/L Final  ?11/28/2013 3.9 3.5 - 5.1 mmol/L Final  ?  ?  ?  ?  Failed - Ca in normal range and within 180 days  ?  Calcium  ?Date Value Ref Range Status  ?09/01/2020 8.9 8.7 - 10.3 mg/dL Final  ? ?Calcium, Total  ?Date Value Ref Range Status  ?11/28/2013 8.0 (L) 8.5 - 10.1 mg/dL Final  ?  ?  ?  ?  Failed - Na in normal range and within 180 days  ?  Sodium  ?Date Value Ref Range Status  ?09/01/2020 141 134 - 144 mmol/L Final  ?11/28/2013 137 136 - 145 mmol/L Final  ?  ?  ?  ?  Failed - Cl in normal range and within 180 days  ?  Chloride  ?  Date Value Ref Range Status  ?09/01/2020 103 96 - 106 mmol/L Final  ?11/28/2013 104 98 - 107 mmol/L Final  ?  ?  ?  ?  Failed - Mg Level in normal range and within 180 days  ?  Magnesium  ?Date Value Ref Range Status  ?02/06/2019 1.9 1.7 - 2.4 mg/dL Final  ?  Comment:  ?  Performed at [facility], [address] [place], [place] [address]  ?  ?  ?  ?  Failed - Valid encounter within last 6 months  ?  Recent Outpatient Visits   ? ?      ? 8 months ago Essential hypertension  ? [facility] [place], Dionne Bucy, [place]  ? 1 year ago Primary hypertension  ? [place], [name]  ? 2 years ago Pneumonia due to COVID-19 virus  ? [place], [name]  ? 2 years ago Encounter for support and coordination of transition of care  ? [place], [name]  ? 2 years ago Acute viral syndrome  ? [place], [name]  ? ?  ?  ?Future Appointments   ? ?        ? In 5 days Gwyneth Sprout, [place], [facility]  ? ?  ? ? ?  ?  ?  Passed - Cr in normal range and within 180 days  ?  Creatinine  ?Date Value Ref Range  Status  ?11/28/2013 0.73 0.60 - 1.30 mg/dL Final  ? ?Creatinine, Ser  ?Date Value Ref Range Status  ?11/03/2020 0.80 0.44 - 1.00 mg/dL Final  ?  ?  ?  ?  Passed - Last BP in normal range  ?  BP Readings from Last 1 Encounters:  ?11/03/20 128/76  ?  ?  ?  ?  ? ?

## 2021-05-04 NOTE — Progress Notes (Signed)
- ? ?[employer] as a Education administrator for Gwyneth Sprout, FNP.,have documented all relevant documentation on the behalf of Gwyneth Sprout, FNP,as directed by  Gwyneth Sprout, FNP while in the presence of Gwyneth Sprout, FNP.  ? ?Annual Wellness Visit ? ?  ? ?Patient: Leslie Koch, Female    DOB: 1955/10/26, 66 y.o.   MRN: [id number] ?Visit Date: 05/05/2021 ? ?Today's Provider: Gwyneth Sprout, FNP  ? ?Patient presents for wellness visit to establish care.  Introduced to Designer, jewellery role and practice setting.  All questions answered.  Discussed provider/patient relationship and expectations. ? ? ?Chief Complaint  ?Patient presents with  ? Medicare Wellness  ? ?Subjective  ?  ?Leslie Koch is a 66 y.o. female who presents today for her Annual Wellness Visit. ?She reports consuming a general diet. The patient does not participate in regular exercise at present. She generally feels well. She reports sleeping poorly, patient reports difficulty falling asleep do to racing thoughts . She does have additional problems to discuss today.  ? ?HPI ? ? ? ?Medications: ?Outpatient Medications Prior to Visit  ?Medication Sig  ? aspirin EC 81 MG tablet Take 81 mg by mouth daily.   ? Calcium Carbonate-Vitamin D (CALCIUM 500/D PO) Take 1 tablet by mouth daily.  ? celecoxib (CELEBREX) 200 MG capsule TAKE 1 CAPSULE BY MOUTH EVERY DAY  ? cetirizine (ZYRTEC) 10 MG tablet Take 10 mg by mouth daily.   ? Cholecalciferol (VITAMIN D3) 1000 units CAPS Take 1,000 Units by mouth in the morning and at bedtime.  ? estradiol (ESTRACE) 1 MG tablet TAKE 1 TABLET (1 MG TOTAL) BY MOUTH DAILY. PLEASE SCHEDULE AN OFFICE VISIT BEFORE ANYMORE REFILLS.  ? fluticasone (FLONASE) 50 MCG/ACT nasal spray Place 2 sprays into both nostrils daily.  ? furosemide (LASIX) 40 MG tablet TAKE 1 TABLET BY MOUTH TWICE A DAY AS NEEDED  ? Glucosamine-Chondroitin 750-600 MG TABS Take 1 tablet by mouth in the morning and at bedtime.  ? Multiple Vitamin (MULTIVITAMIN)  tablet Take 1 tablet by mouth daily.  ? Omega-3 1000 MG CAPS Take 2,000 mg by mouth daily.  ? oxymetazoline (AFRIN) 0.05 % nasal spray Place 1-2 sprays into both nostrils 2 (two) times daily as needed for congestion.  ? potassium chloride (K-DUR) 10 MEQ tablet Take 1 tablet (10 mEq total) by mouth 2 (two) times daily as needed.  ? senna-docusate (SENOKOT-S) 8.6-50 MG tablet Take 1 tablet by mouth at bedtime.  ? simvastatin (ZOCOR) 40 MG tablet TAKE 1 TABLET BY MOUTH EVERYDAY AT BEDTIME  ? [DISCONTINUED] fluticasone-salmeterol (ADVAIR HFA) 230-21 MCG/ACT inhaler Inhale 2 puffs into the lungs 2 (two) times daily.  ? [DISCONTINUED] lisinopril-hydrochlorothiazide (ZESTORETIC) 20-12.5 MG tablet TAKE 1 TABLET BY MOUTH EVERY DAY  ? [DISCONTINUED] valACYclovir (VALTREX) 1000 MG tablet TAKE 1-2 TABLETS (1,000-2,000 MG TOTAL) BY MOUTH EVERY 12 (TWELVE) HOURS AS NEEDED.  ? ?No facility-administered medications prior to visit.  ?  ?Allergies  ?Allergen Reactions  ? Augmentin [Amoxicillin-Pot Clavulanate]   ?  diarrhea  ? Montelukast Sodium Rash  ? ? ?Patient Care Team: ?Gwyneth Sprout, FNP as PCP - General (Family Medicine) ?Minna Merritts, MD as Consulting Physician (Cardiology) ? ?Review of Systems ? ? ?  ? Objective  ?  ?Vitals: BP 124/73   Pulse 80   Temp 97.9 ?F (36.6 ?C) (Oral)   Resp 16   Ht '5\' 1"'$  (1.549 m)   Wt 230 lb 8 oz (104.6 kg)  SpO2 99%   BMI 43.55 kg/m?  ? ? ? ?Physical Exam ?Vitals and nursing note reviewed.  ?Constitutional:   ?   General: She is awake. She is not in acute distress. ?   Appearance: Normal appearance. She is well-developed and well-groomed. She is obese. She is not ill-appearing, toxic-appearing or diaphoretic.  ?HENT:  ?   Head: Normocephalic and atraumatic.  ?   Jaw: There is normal jaw occlusion. No trismus, tenderness, swelling or pain on movement.  ?   Right Ear: Hearing, tympanic membrane, ear canal and external ear normal. There is no impacted cerumen.  ?   Left Ear: Hearing,  tympanic membrane, ear canal and external ear normal. There is no impacted cerumen.  ?   Nose: Nose normal. No congestion or rhinorrhea.  ?   Right Turbinates: Not enlarged, swollen or pale.  ?   Left Turbinates: Not enlarged, swollen or pale.  ?   Right Sinus: No maxillary sinus tenderness or frontal sinus tenderness.  ?   Left Sinus: No maxillary sinus tenderness or frontal sinus tenderness.  ?   Mouth/Throat:  ?   Lips: Pink.  ?   Mouth: Mucous membranes are moist. No injury.  ?   Tongue: No lesions.  ?   Pharynx: Oropharynx is clear. Uvula midline. No pharyngeal swelling, oropharyngeal exudate, posterior oropharyngeal erythema or uvula swelling.  ?   Tonsils: No tonsillar exudate or tonsillar abscesses.  ?Eyes:  ?   General: Lids are normal. Lids are everted, no foreign bodies appreciated. Vision grossly intact. Gaze aligned appropriately. No allergic shiner or visual field deficit.    ?   Right eye: No discharge.     ?   Left eye: No discharge.  ?   Extraocular Movements: Extraocular movements intact.  ?   Conjunctiva/sclera: Conjunctivae normal.  ?   Right eye: Right conjunctiva is not injected. No exudate. ?   Left eye: Left conjunctiva is not injected. No exudate. ?   Pupils: Pupils are equal, round, and reactive to light.  ?Neck:  ?   Thyroid: No thyroid mass, thyromegaly or thyroid tenderness.  ?   Vascular: No carotid bruit.  ?   Trachea: Trachea normal.  ?Cardiovascular:  ?   Rate and Rhythm: Normal rate and regular rhythm.  ?   Pulses: Normal pulses.     ?     Carotid pulses are 2+ on the right side and 2+ on the left side. ?     Radial pulses are 2+ on the right side and 2+ on the left side.  ?     Dorsalis pedis pulses are 2+ on the right side and 2+ on the left side.  ?     Posterior tibial pulses are 2+ on the right side and 2+ on the left side.  ?   Heart sounds: Normal heart sounds, S1 normal and S2 normal. No murmur heard. ?  No friction rub. No gallop.  ?Pulmonary:  ?   Effort: Pulmonary effort  is normal. No respiratory distress.  ?   Breath sounds: Normal breath sounds and air entry. No stridor. No wheezing, rhonchi or rales.  ?Chest:  ?   Chest wall: No tenderness.  ?Abdominal:  ?   General: Abdomen is flat. Bowel sounds are normal. There is no distension.  ?   Palpations: Abdomen is soft. There is no mass.  ?   Tenderness: There is no abdominal tenderness. There is no right CVA tenderness, left  CVA tenderness, guarding or rebound.  ?   Hernia: No hernia is present.  ?Genitourinary: ?   Comments: Complaints of vaginal itching; normal Urine dip in office. ?Musculoskeletal:     ?   General: No swelling, tenderness, deformity or signs of injury. Normal range of motion.  ?   Cervical back: Full passive range of motion without pain, normal range of motion and neck supple. No edema, rigidity or tenderness. No muscular tenderness.  ?   Right lower leg: No edema.  ?   Left lower leg: No edema.  ?Lymphadenopathy:  ?   Cervical: No cervical adenopathy.  ?   Right cervical: No superficial, deep or posterior cervical adenopathy. ?   Left cervical: No superficial, deep or posterior cervical adenopathy.  ?Skin: ?   General: Skin is warm and dry.  ?   Capillary Refill: Capillary refill takes less than 2 seconds.  ?   Coloration: Skin is not jaundiced or pale.  ?   Findings: No bruising, erythema, lesion or rash.  ?Neurological:  ?   General: No focal deficit present.  ?   Mental Status: She is alert and oriented to person, place, and time. Mental status is at baseline.  ?   GCS: GCS eye subscore is 4. GCS verbal subscore is 5. GCS motor subscore is 6.  ?   Sensory: Sensation is intact. No sensory deficit.  ?   Motor: Motor function is intact. No weakness.  ?   Coordination: Coordination is intact. Coordination normal.  ?   Gait: Gait is intact. Gait normal.  ?Psychiatric:     ?   Attention and Perception: Attention and perception normal.     ?   Mood and Affect: Mood and affect normal.     ?   Speech: Speech normal.      ?   Behavior: Behavior normal. Behavior is cooperative.     ?   Thought Content: Thought content normal.     ?   Cognition and Memory: Cognition and memory normal.     ?   Judgment: Judgment normal.  ? ?M

## 2021-05-05 ENCOUNTER — Ambulatory Visit (INDEPENDENT_AMBULATORY_CARE_PROVIDER_SITE_OTHER): Payer: Medicare HMO | Admitting: Family Medicine

## 2021-05-05 ENCOUNTER — Encounter: Payer: Self-pay | Admitting: Family Medicine

## 2021-05-05 ENCOUNTER — Encounter: Payer: Self-pay | Admitting: Dermatology

## 2021-05-05 VITALS — BP 124/73 | HR 80 | Temp 97.9°F | Resp 16 | Ht 61.0 in | Wt 230.5 lb

## 2021-05-05 DIAGNOSIS — Z1231 Encounter for screening mammogram for malignant neoplasm of breast: Secondary | ICD-10-CM | POA: Diagnosis not present

## 2021-05-05 DIAGNOSIS — I10 Essential (primary) hypertension: Secondary | ICD-10-CM

## 2021-05-05 DIAGNOSIS — E7800 Pure hypercholesterolemia, unspecified: Secondary | ICD-10-CM | POA: Diagnosis not present

## 2021-05-05 DIAGNOSIS — Z780 Asymptomatic menopausal state: Secondary | ICD-10-CM

## 2021-05-05 DIAGNOSIS — B001 Herpesviral vesicular dermatitis: Secondary | ICD-10-CM

## 2021-05-05 DIAGNOSIS — Z13820 Encounter for screening for osteoporosis: Secondary | ICD-10-CM | POA: Diagnosis not present

## 2021-05-05 DIAGNOSIS — Z1211 Encounter for screening for malignant neoplasm of colon: Secondary | ICD-10-CM | POA: Diagnosis not present

## 2021-05-05 DIAGNOSIS — Z23 Encounter for immunization: Secondary | ICD-10-CM | POA: Diagnosis not present

## 2021-05-05 DIAGNOSIS — M25562 Pain in left knee: Secondary | ICD-10-CM | POA: Diagnosis not present

## 2021-05-05 DIAGNOSIS — U099 Post covid-19 condition, unspecified: Secondary | ICD-10-CM

## 2021-05-05 DIAGNOSIS — N898 Other specified noninflammatory disorders of vagina: Secondary | ICD-10-CM

## 2021-05-05 DIAGNOSIS — G8929 Other chronic pain: Secondary | ICD-10-CM

## 2021-05-05 DIAGNOSIS — Z0000 Encounter for general adult medical examination without abnormal findings: Secondary | ICD-10-CM

## 2021-05-05 DIAGNOSIS — Z7409 Other reduced mobility: Secondary | ICD-10-CM | POA: Diagnosis not present

## 2021-05-05 LAB — POCT URINALYSIS DIPSTICK
Bilirubin, UA: NEGATIVE
Blood, UA: NEGATIVE
Glucose, UA: NEGATIVE
Leukocytes, UA: NEGATIVE
Nitrite, UA: NEGATIVE
Protein, UA: NEGATIVE
Spec Grav, UA: <=1.005 — AB (ref 1.010–1.025)
Urobilinogen, UA: 0.2 E.U./dL
pH, UA: 6.5 (ref 5.0–8.0)

## 2021-05-05 MED ORDER — LISINOPRIL-HYDROCHLOROTHIAZIDE 20-12.5 MG PO TABS: 1.0000 | ORAL_TABLET | Freq: Every day | ORAL | 1 refills | Status: DC

## 2021-05-05 MED ORDER — ESTRADIOL 0.1 MG/GM VA CREA: 1.0000 | TOPICAL_CREAM | VAGINAL | 12 refills | Status: DC

## 2021-05-05 MED ORDER — VALACYCLOVIR HCL 1 G PO TABS: 1000.0000 mg | ORAL_TABLET | Freq: Two times a day (BID) | ORAL | 0 refills | Status: DC | PRN

## 2021-05-05 MED ORDER — ALBUTEROL SULFATE HFA 108 (90 BASE) MCG/ACT IN AERS: 2.0000 | INHALATION_SPRAY | Freq: Four times a day (QID) | RESPIRATORY_TRACT | 2 refills | Status: DC | PRN

## 2021-05-05 NOTE — Assessment & Plan Note (Signed)
Chronic, stable ?Request for PRN inhaler ?Received PNA vaccine #1 today ?Continue to work on [employer] management ?Declines referral to pulmonary at this time  ?

## 2021-05-05 NOTE — Assessment & Plan Note (Signed)
Chronic, unstable ?Reports L knee is "bone on bone" ?Slight edema to lower leg ?Declines referral at this time ? ?

## 2021-05-05 NOTE — Assessment & Plan Note (Signed)
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  ?- Eat a low-fat diet with lots of fruits and vegetables, up to 7-9 servings per day.  ?- Seatbelts can save your life. Wear them always.  ?- Smoke detectors on every level of your home, check batteries every year.  ?- Eye Doctor - have an eye exam every 1-2 years  ?- Safe sex - if you may be exposed to STDs, use a condom.  ?- Alcohol -  If you drink, do it moderately, less than 2 drinks per day.  ?- [place]. Choose someone to speak for you if you are not able.  ?- Depression is common in our stressful world.If you're feeling down or losing interest in things you normally enjoy, please come in for a visit.  ?- Violence - If anyone is threatening or hurting you, please call immediately. ? ? ?

## 2021-05-05 NOTE — Assessment & Plan Note (Signed)
DEXA for bone density screening ?Hx of R knee replacement ?Hx of HTN, HLD, Obesity  ?

## 2021-05-05 NOTE — Assessment & Plan Note (Signed)
Chronic, stable ?Denies CP ?Denies SOB/ DOE ?Denies low blood pressure/hypotension ?Denies vision changes ?No LE Edema noted on exam ?Continue medication, Lisinopril 20-12.5 mg ?Denies side effects ?RTC 6 months ?Seek emergent care if you develop chest pain or chest pressure ? ?

## 2021-05-05 NOTE — Assessment & Plan Note (Signed)
Chronic, stable ?Request for refill of PRN antiviral ?Oral lesions only; none present at this time ?

## 2021-05-05 NOTE — Assessment & Plan Note (Signed)
Chronic, previously borderline with use of Zocor 40 mg ?We recommend diet low in saturated fat and regular exercise - 30 min at least 5 times per week ? ?

## 2021-05-05 NOTE — Assessment & Plan Note (Signed)
>  66 years old ?Hx of COVID PNA ?1st dose provided today; consent received ?Will repeat with 13/20 in 1 year  ?

## 2021-05-05 NOTE — Assessment & Plan Note (Signed)
Denies current concerns with stool changes of color, consistency or character ?Will refer for colon cancer screening through colonoscopy ?Reports previous failed cologuard screenings d/t internal hemorrhoids  ?

## 2021-05-05 NOTE — Assessment & Plan Note (Signed)
Chronic, exacerbated ?Will try trial of topical estrogen 3x/week ?No UTI at this time ?Denies visual examination  ?Refer to GYN if continues to be a concern ?

## 2021-05-05 NOTE — Assessment & Plan Note (Signed)
Due for screening for mammogram, denies breast concerns, provided with phone number to call and schedule appointment for mammogram. Encouraged to repeat breast cancer screening every 1-2 years.  

## 2021-05-05 NOTE — Addendum Note (Signed)
Addended by: Ralene Bathe on: 05/05/2021 06:46 PM ? ? Modules accepted: Level of Service ? ?

## 2021-05-05 NOTE — Assessment & Plan Note (Signed)
Chronic, stable, BMI has increased ?Body mass index is 43.55 kg/m?. ?Discussed importance of healthy weight management ?Discussed diet and exercise ? ?

## 2021-05-06 ENCOUNTER — Telehealth: Payer: Self-pay

## 2021-05-06 DIAGNOSIS — Z1211 Encounter for screening for malignant neoplasm of colon: Secondary | ICD-10-CM

## 2021-05-06 NOTE — Telephone Encounter (Signed)
LVM for pt to return my call.

## 2021-05-08 ENCOUNTER — Other Ambulatory Visit: Payer: Self-pay

## 2021-05-08 DIAGNOSIS — Z1211 Encounter for screening for malignant neoplasm of colon: Secondary | ICD-10-CM

## 2021-05-08 MED ORDER — NA SULFATE-K SULFATE-MG SULF 17.5-3.13-1.6 GM/177ML PO SOLN: 1.0000 | Freq: Once | ORAL | 0 refills | Status: AC

## 2021-05-08 NOTE — Progress Notes (Signed)
Gastroenterology Pre-Procedure Review ? ?Request Date: 06/22/2021 ?Requesting Physician: Dr. Marius Ditch ? ?PATIENT REVIEW QUESTIONS: The patient responded to the following health history questions as indicated:   ? ?1. Are you having any GI issues? no ?2. Do you have a personal history of Polyps? no ?3. Do you have a family history of Colon Cancer or Polyps? no ?4. Diabetes Mellitus? no ?5. Joint replacements in the past 12 months?no ?6. Major health problems in the past 3 months?no ?7. Any artificial heart valves, MVP, or defibrillator?no ?   ?MEDICATIONS & ALLERGIES:    ?Patient reports the following regarding taking any anticoagulation/antiplatelet therapy:   ?Plavix, Coumadin, Eliquis, Xarelto, Lovenox, Pradaxa, Brilinta, or Effient? no ?Aspirin? yes (81 mg) ? ?Patient confirms/reports the following medications:  ?Current Outpatient Medications  ?Medication Sig Dispense Refill  ? albuterol (VENTOLIN HFA) 108 (90 Base) MCG/ACT inhaler Inhale 2 puffs into the lungs every 6 (six) hours as needed for wheezing or shortness of breath. 8 g 2  ? aspirin EC 81 MG tablet Take 81 mg by mouth daily.     ? Calcium Carbonate-Vitamin D (CALCIUM 500/D PO) Take 1 tablet by mouth daily.    ? celecoxib (CELEBREX) 200 MG capsule TAKE 1 CAPSULE BY MOUTH EVERY DAY 90 capsule 1  ? cetirizine (ZYRTEC) 10 MG tablet Take 10 mg by mouth daily.     ? Cholecalciferol (VITAMIN D3) 1000 units CAPS Take 1,000 Units by mouth in the morning and at bedtime.    ? estradiol (ESTRACE) 0.1 MG/GM vaginal cream Place 1 Applicatorful vaginally 3 (three) times a week. 42.5 g 12  ? estradiol (ESTRACE) 1 MG tablet TAKE 1 TABLET (1 MG TOTAL) BY MOUTH DAILY. PLEASE SCHEDULE AN OFFICE VISIT BEFORE ANYMORE REFILLS. 90 tablet 1  ? fluticasone (FLONASE) 50 MCG/ACT nasal spray Place 2 sprays into both nostrils daily.    ? furosemide (LASIX) 40 MG tablet TAKE 1 TABLET BY MOUTH TWICE A DAY AS NEEDED 60 tablet 0  ? Glucosamine-Chondroitin 750-600 MG TABS Take 1 tablet by  mouth in the morning and at bedtime.    ? lisinopril-hydrochlorothiazide (ZESTORETIC) 20-12.5 MG tablet Take 1 tablet by mouth daily. 90 tablet 1  ? Multiple Vitamin (MULTIVITAMIN) tablet Take 1 tablet by mouth daily.    ? Omega-3 1000 MG CAPS Take 2,000 mg by mouth daily.    ? oxymetazoline (AFRIN) 0.05 % nasal spray Place 1-2 sprays into both nostrils 2 (two) times daily as needed for congestion.    ? potassium chloride (K-DUR) 10 MEQ tablet Take 1 tablet (10 mEq total) by mouth 2 (two) times daily as needed. 60 tablet 4  ? senna-docusate (SENOKOT-S) 8.6-50 MG tablet Take 1 tablet by mouth at bedtime.    ? simvastatin (ZOCOR) 40 MG tablet TAKE 1 TABLET BY MOUTH EVERYDAY AT BEDTIME 30 tablet 0  ? valACYclovir (VALTREX) 1000 MG tablet Take 1-2 tablets (1,000-2,000 mg total) by mouth every 12 (twelve) hours as needed. 28 tablet 0  ? ?No current facility-administered medications for this visit.  ? ? ?Patient confirms/reports the following allergies:  ?Allergies  ?Allergen Reactions  ? Augmentin [Amoxicillin-Pot Clavulanate]   ?  diarrhea  ? Montelukast Sodium Rash  ? ? ?No orders of the defined types were placed in this encounter. ? ? ?AUTHORIZATION INFORMATION ?Primary Insurance: ?1D#: ?Group #: ? ?Secondary Insurance: ?1D#: ?Group #: ? ?SCHEDULE INFORMATION: ?Date: 06/22/2021 ?Time: ?Location: ?[facility] ?

## 2021-05-13 ENCOUNTER — Other Ambulatory Visit: Payer: Self-pay | Admitting: Family Medicine

## 2021-05-13 ENCOUNTER — Other Ambulatory Visit: Payer: Self-pay

## 2021-05-13 ENCOUNTER — Telehealth: Payer: Self-pay | Admitting: Gastroenterology

## 2021-05-13 ENCOUNTER — Telehealth: Payer: Self-pay

## 2021-05-13 DIAGNOSIS — R232 Flushing: Secondary | ICD-10-CM

## 2021-05-13 MED ORDER — PEG 3350-KCL-NA BICARB-NACL 420 G PO SOLR: 4000.0000 mL | Freq: Once | ORAL | 0 refills | Status: AC

## 2021-05-13 MED ORDER — GOLYTELY 236 G PO SOLR: 4000.0000 mL | Freq: Once | ORAL | 0 refills | Status: DC

## 2021-05-13 NOTE — Telephone Encounter (Signed)
Sent in golytly for patient and let her know  ?

## 2021-05-13 NOTE — Telephone Encounter (Signed)
PT would like a prep cheaper than 94.00 called in to [facility] ?

## 2021-05-14 ENCOUNTER — Other Ambulatory Visit: Payer: Self-pay | Admitting: Family Medicine

## 2021-05-14 DIAGNOSIS — R232 Flushing: Secondary | ICD-10-CM

## 2021-05-14 MED ORDER — ESTRADIOL 1 MG PO TABS: 1.0000 mg | ORAL_TABLET | Freq: Every day | ORAL | 1 refills | Status: DC

## 2021-05-14 NOTE — Telephone Encounter (Signed)
Requested medication (s) are due for refill today:  Due 06/16/21 ? ?Requested medication (s) are on the active medication list: yes   ? ?Last refill: 12/16/20 #90  1 refill ? ?Future visit scheduled yes 11/03/21 ? ?Notes to clinic:DX Code needed. Pease review. Thank you. ? ?Requested Prescriptions  ?Pending Prescriptions Disp Refills  ? estradiol (ESTRACE) 1 MG tablet [Pharmacy Med Name: ESTRADIOL 1 MG TABLET] 90 tablet 2  ?  Sig: TAKE 1 TABLET (1 MG TOTAL) BY MOUTH DAILY. PLEASE SCHEDULE AN OFFICE VISIT BEFORE ANYMORE REFILLS.  ?  ? OB/GYN:  Estrogens Passed - 05/13/2021  1:35 PM  ?  ?  Passed - Mammogram is up-to-date per Health Maintenance  ?  ?  Passed - Last BP in normal range  ?  BP Readings from Last 1 Encounters:  ?05/05/21 124/73  ?  ?  ?  ?  Passed - Valid encounter within last 12 months  ?  Recent Outpatient Visits   ? ?      ? 1 week ago Welcome to [employer] preventive visit  ? [facility] Gwyneth Sprout, FNP  ? 8 months ago Essential hypertension  ? [facility], Dionne Bucy, MD  ? 1 year ago Primary hypertension  ? [place], [name]  ? 2 years ago Pneumonia due to COVID-19 virus  ? [place], [name]  ? 2 years ago Encounter for support and coordination of transition of care  ? [place], [name]  ? ?  ?  ?Future Appointments   ? ?        ? In 5 months Gwyneth Sprout, [place], [facility]  ? ?  ? ? ?  ?  ?  ? ? ? ? ?

## 2021-05-21 ENCOUNTER — Telehealth: Payer: Self-pay

## 2021-05-21 NOTE — Telephone Encounter (Signed)
Copied from [place] [id number]. Topic: General - Inquiry ?>> May 21, 2021 11:18 AM Alanda Slim E wrote: ?Reason for CRM: Pt missed a call from the office on Tuesday / pt returned call / please advise ?

## 2021-05-21 NOTE — Telephone Encounter (Signed)
Not sure who called the patient. No telephone documentation on file. No upcoming appointments. Not sure who called. ?

## 2021-05-28 ENCOUNTER — Other Ambulatory Visit: Payer: Self-pay | Admitting: Family Medicine

## 2021-05-28 DIAGNOSIS — E7800 Pure hypercholesterolemia, unspecified: Secondary | ICD-10-CM

## 2021-05-29 NOTE — Telephone Encounter (Signed)
Requested medication (s) are due for refill today: yes  Requested medication (s) are on the active medication list: yes  Last refill:  05/01/21 #30  Future visit scheduled: yes   Notes to clinic:  overdue lipid panel- appt made for a week from now- can pt have a lab only visit versus and OV. Pt was seen in office end of April. Order for lipid profile would need to be ordered- please notify pt as she would like the lab only visit-   Requested Prescriptions  Pending Prescriptions Disp Refills   simvastatin (ZOCOR) 40 MG tablet [Pharmacy Med Name: SIMVASTATIN 40 MG TABLET] 30 tablet 0    Sig: TAKE 1 TABLET BY MOUTH EVERYDAY AT BEDTIME     Cardiovascular:  Antilipid - Statins Failed - 05/28/2021  6:09 PM      Failed - Lipid Panel in normal range within the last 12 months    Cholesterol, Total  Date Value Ref Range Status  03/03/2020 196 100 - 199 mg/dL Final   LDL Chol Calc (NIH)  Date Value Ref Range Status  03/03/2020 99 0 - 99 mg/dL Final   HDL  Date Value Ref Range Status  03/03/2020 68 >39 mg/dL Final   Triglycerides  Date Value Ref Range Status  03/03/2020 172 (H) 0 - 149 mg/dL Final         Passed - Patient is not pregnant      Passed - Valid encounter within last 12 months    Recent Outpatient Visits           3 weeks ago Welcome [facility] [employer] preventive visit   [facility] Gwyneth Sprout, FNP   9 months ago Essential hypertension   [facility], Dionne Bucy, MD   1 year ago Primary hypertension   [place], [name], [name]   2 years ago Pneumonia due to COVID-19 virus   [facility], [name], [name]   2 years ago Encounter for support and coordination of transition of care   [facility] [facility], [name], [name]       Future Appointments             In 1 week Gwyneth Sprout, [place], [place]   In 5 months Rollene Rotunda, Jaci Standard, [place], [place]

## 2021-05-29 NOTE — Telephone Encounter (Signed)
Please advise 

## 2021-06-02 ENCOUNTER — Other Ambulatory Visit: Payer: Self-pay | Admitting: Family Medicine

## 2021-06-02 DIAGNOSIS — R600 Localized edema: Secondary | ICD-10-CM

## 2021-06-08 ENCOUNTER — Other Ambulatory Visit: Payer: Self-pay | Admitting: Family Medicine

## 2021-06-08 DIAGNOSIS — R600 Localized edema: Secondary | ICD-10-CM

## 2021-06-09 ENCOUNTER — Other Ambulatory Visit: Payer: Self-pay | Admitting: Family Medicine

## 2021-06-09 ENCOUNTER — Ambulatory Visit: Payer: Medicare HMO | Admitting: Family Medicine

## 2021-06-09 DIAGNOSIS — E7800 Pure hypercholesterolemia, unspecified: Secondary | ICD-10-CM | POA: Diagnosis not present

## 2021-06-10 LAB — LIPID PANEL
Chol/HDL Ratio: 3 ratio (ref 0.0–4.4)
Cholesterol, Total: 169 mg/dL (ref 100–199)
HDL: 57 mg/dL (ref >39)
LDL Chol Calc (NIH): 86 mg/dL (ref 0–99)
Triglycerides: 149 mg/dL (ref 0–149)
VLDL Cholesterol Cal: 26 mg/dL (ref 5–40)

## 2021-06-19 ENCOUNTER — Encounter: Payer: Self-pay | Admitting: Gastroenterology

## 2021-06-22 ENCOUNTER — Ambulatory Visit: Payer: Medicare HMO | Admitting: Certified Registered"

## 2021-06-22 ENCOUNTER — Encounter
Admission: RE | Disposition: A | Discharge status: Home / Self Care | Payer: Self-pay | Source: Home / Self Care | Attending: Gastroenterology

## 2021-06-22 ENCOUNTER — Encounter: Payer: Self-pay | Admitting: Gastroenterology

## 2021-06-22 ENCOUNTER — Ambulatory Visit
Admission: RE | Admit: 2021-06-22 | Discharge: 2021-06-22 | Disposition: A | Discharge status: Home / Self Care | Payer: Medicare HMO | Attending: Gastroenterology | Admitting: Gastroenterology

## 2021-06-22 DIAGNOSIS — Z87891 Personal history of nicotine dependence: Secondary | ICD-10-CM | POA: Insufficient documentation

## 2021-06-22 DIAGNOSIS — K621 Rectal polyp: Secondary | ICD-10-CM | POA: Insufficient documentation

## 2021-06-22 DIAGNOSIS — K635 Polyp of colon: Secondary | ICD-10-CM | POA: Diagnosis not present

## 2021-06-22 DIAGNOSIS — Z1211 Encounter for screening for malignant neoplasm of colon: Secondary | ICD-10-CM

## 2021-06-22 DIAGNOSIS — I10 Essential (primary) hypertension: Secondary | ICD-10-CM | POA: Diagnosis not present

## 2021-06-22 DIAGNOSIS — D122 Benign neoplasm of ascending colon: Secondary | ICD-10-CM | POA: Diagnosis not present

## 2021-06-22 DIAGNOSIS — K644 Residual hemorrhoidal skin tags: Secondary | ICD-10-CM | POA: Insufficient documentation

## 2021-06-22 DIAGNOSIS — K219 Gastro-esophageal reflux disease without esophagitis: Secondary | ICD-10-CM | POA: Diagnosis not present

## 2021-06-22 DIAGNOSIS — K5730 Diverticulosis of large intestine without perforation or abscess without bleeding: Secondary | ICD-10-CM | POA: Insufficient documentation

## 2021-06-22 HISTORY — PX: COLONOSCOPY WITH PROPOFOL: SHX5780

## 2021-06-22 SURGERY — COLONOSCOPY WITH PROPOFOL
Anesthesia: General

## 2021-06-22 MED ORDER — PROPOFOL 1000 MG/100ML IV EMUL
INTRAVENOUS | Status: AC
  Filled 2021-06-22: qty 100

## 2021-06-22 MED ORDER — LIDOCAINE HCL (CARDIAC) PF 100 MG/5ML IV SOSY
PREFILLED_SYRINGE | INTRAVENOUS | Status: DC | PRN
  Administered 2021-06-22: 25 mg via INTRAVENOUS

## 2021-06-22 MED ORDER — SODIUM CHLORIDE 0.9 % IV SOLN: INTRAVENOUS | Status: DC | PRN

## 2021-06-22 MED ORDER — KETAMINE HCL 50 MG/5ML IJ SOSY
PREFILLED_SYRINGE | INTRAMUSCULAR | Status: AC
  Filled 2021-06-22: qty 5

## 2021-06-22 MED ORDER — FENTANYL CITRATE (PF) 100 MCG/2ML IJ SOLN
INTRAMUSCULAR | Status: DC | PRN
Start: 2021-06-22 — End: 2021-06-22
  Administered 2021-06-22 (×2): 25 ug via INTRAVENOUS

## 2021-06-22 MED ORDER — PROPOFOL 10 MG/ML IV BOLUS
INTRAVENOUS | Status: DC | PRN
  Administered 2021-06-22: 100 mg via INTRAVENOUS

## 2021-06-22 MED ORDER — FENTANYL CITRATE (PF) 100 MCG/2ML IJ SOLN
INTRAMUSCULAR | Status: AC
  Filled 2021-06-22: qty 2

## 2021-06-22 MED ORDER — PROPOFOL 500 MG/50ML IV EMUL
INTRAVENOUS | Status: DC | PRN
  Administered 2021-06-22: 150 ug/kg/min via INTRAVENOUS

## 2021-06-22 MED ORDER — SODIUM CHLORIDE 0.9 % IV SOLN: INTRAVENOUS | Status: DC

## 2021-06-22 NOTE — H&P (Signed)
Cephas Darby, MD [address]  [place]  [place], [place] [address]  Main: [phone]  Fax: [phone] Pager: [phone]  Primary Care Physician:  Gwyneth Sprout, FNP Primary Gastroenterologist:  Dr. Cephas Darby  Pre-Procedure History & Physical: HPI:  Leslie Koch is a 66 y.o. female is here for an colonoscopy.   Past Medical History:  Diagnosis Date   Arthritis    GERD (gastroesophageal reflux disease)    Hypertension    PONV (postoperative nausea and vomiting)     Past Surgical History:  Procedure Laterality Date   ABDOMINAL HYSTERECTOMY  01/1999   CYST EXCISION  1974   KNEE SURGERY Right 07/2010   TOTAL KNEE ARTHROPLASTY Right 10/12/2012   TOTAL KNEE ARTHROPLASTY WITH REVISION COMPONENTS Right 02/11/2016   Procedure: RIGHT TOTAL KNEE ARTHROPLASTY REVISION;  Surgeon: Gaynelle Arabian, MD;  Location: [facility];  Service: Orthopedics;  Laterality: Right;   TUBAL LIGATION  1984    Prior to Admission medications   Medication Sig Start Date End Date Taking? Authorizing Provider  albuterol (VENTOLIN HFA) 108 (90 Base) MCG/ACT inhaler Inhale 2 puffs into the lungs every 6 (six) hours as needed for wheezing or shortness of breath. 05/05/21  Yes Gwyneth Sprout, FNP  lisinopril-hydrochlorothiazide (ZESTORETIC) 20-12.5 MG tablet Take 1 tablet by mouth daily. 05/05/21  Yes Gwyneth Sprout, FNP  aspirin EC 81 MG tablet Take 81 mg by mouth daily.     [provider]  Calcium Carbonate-Vitamin D (CALCIUM 500/D PO) Take 1 tablet by mouth daily.    [provider]  celecoxib (CELEBREX) 200 MG capsule TAKE 1 CAPSULE BY MOUTH EVERY DAY 12/16/20   Gwyneth Sprout, FNP  cetirizine (ZYRTEC) 10 MG tablet Take 10 mg by mouth daily.     [provider]  Cholecalciferol (VITAMIN D3) 1000 units CAPS Take 1,000 Units by mouth in the morning and at bedtime.    [provider]  estradiol (ESTRACE) 0.1 MG/GM vaginal cream Place 1 Applicatorful vaginally 3  (three) times a week. 05/06/21   Gwyneth Sprout, FNP  estradiol (ESTRACE) 1 MG tablet Take 1 tablet (1 mg total) by mouth daily. 05/14/21   Gwyneth Sprout, FNP  fluticasone (FLONASE) 50 MCG/ACT nasal spray Place 2 sprays into both nostrils daily.    [provider]  furosemide (LASIX) 40 MG tablet TAKE 1 TABLET BY MOUTH TWICE A DAY AS NEEDED 06/09/21   Tally Joe T, FNP  Glucosamine-Chondroitin 750-600 MG TABS Take 1 tablet by mouth in the morning and at bedtime.    [provider]  Multiple Vitamin (MULTIVITAMIN) tablet Take 1 tablet by mouth daily.    [provider]  Omega-3 1000 MG CAPS Take 2,000 mg by mouth daily.    [provider]  oxymetazoline (AFRIN) 0.05 % nasal spray Place 1-2 sprays into both nostrils 2 (two) times daily as needed for congestion.    [provider]  potassium chloride (K-DUR) 10 MEQ tablet Take 1 tablet (10 mEq total) by mouth 2 (two) times daily as needed. 08/25/16   Minna Merritts, MD  senna-docusate (SENOKOT-S) 8.6-50 MG tablet Take 1 tablet by mouth at bedtime.    [provider]  simvastatin (ZOCOR) 40 MG tablet TAKE 1 TABLET BY MOUTH EVERYDAY AT BEDTIME 05/29/21   Gwyneth Sprout, FNP  valACYclovir (VALTREX) 1000 MG tablet Take 1-2 tablets (1,000-2,000 mg total) by mouth every 12 (twelve) hours as needed. 05/05/21   Rollene Rotunda,  Jaci Standard, FNP    Allergies as of 05/08/2021 - Review Complete 05/08/2021  Allergen Reaction Noted   Augmentin [amoxicillin-pot clavulanate]  02/21/2017   Montelukast sodium Rash 08/06/2014    Family History  Problem Relation Age of Onset   Hypertension Mother    Diabetes Mother    Coronary artery disease Mother    Lung cancer Father    Stroke Maternal Grandmother    Coronary artery disease Maternal Grandfather    Breast cancer Paternal Grandmother     Social History   Socioeconomic History   Marital status: Married    Spouse name: Not on file   Number of children: Not on file    Years of education: Not on file   Highest education level: Not on file  Occupational History   Not on file  Tobacco Use   Smoking status: Former    Packs/day: 2.00    Years: 30.00    Total pack years: 60.00    Types: Cigarettes    Quit date: 04/14/1995    Years since quitting: 26.2   Smokeless tobacco: Never   Tobacco comments:    quit 04/14/1995  Vaping Use   Vaping Use: Never used  Substance and Sexual Activity   Alcohol use: Yes    Alcohol/week: 0.0 standard drinks of alcohol    Comment: occasionally 2-3 drinks once or twice a month   Drug use: No   Sexual activity: Yes  Other Topics Concern   Not on file  Social History Narrative   Not on file   Social Determinants of Health   Financial Resource Strain: Not on file  Food Insecurity: Not on file  Transportation Needs: Not on file  Physical Activity: Not on file  Stress: Not on file  Social Connections: Not on file  Intimate Partner Violence: Not on file    Review of Systems: See HPI, otherwise negative ROS  Physical Exam: BP (!) 153/93   Pulse 83   Temp (!) 96.5 F (35.8 C) (Temporal)   Resp 16   Ht '5\' 1"'$  (1.549 m)   Wt 99.8 kg   SpO2 97%   BMI 41.57 kg/m  General:   Alert,  pleasant and cooperative in NAD Head:  Normocephalic and atraumatic. Neck:  Supple; no masses or thyromegaly. Lungs:  Clear throughout to auscultation.    Heart:  Regular rate and rhythm. Abdomen:  Soft, nontender and nondistended. Normal bowel sounds, without guarding, and without rebound.   Neurologic:  Alert and  oriented x4;  grossly normal neurologically.  Impression/Plan: Leslie Koch is here for an colonoscopy to be performed for colon cancer screening  Risks, benefits, limitations, and alternatives regarding  colonoscopy have been reviewed with the patient.  Questions have been answered.  All parties agreeable.   Sherri Sear, MD  06/22/2021, 7:46 AM

## 2021-06-22 NOTE — Anesthesia Postprocedure Evaluation (Signed)
Anesthesia Post Note  Patient: Leslie Koch  Procedure(s) Performed: COLONOSCOPY WITH PROPOFOL  Patient location during evaluation: Endoscopy Anesthesia Type: General Level of consciousness: awake and alert Pain management: pain level controlled Vital Signs Assessment: post-procedure vital signs reviewed and stable Respiratory status: spontaneous breathing, nonlabored ventilation, respiratory function stable and patient connected to nasal cannula oxygen Cardiovascular status: blood pressure returned to baseline and stable Postop Assessment: no apparent nausea or vomiting Anesthetic complications: no   No notable events documented.   Last Vitals:  Vitals:   06/22/21 0845 06/22/21 0855  BP: 113/66 118/78  Pulse: 74   Resp: 16   Temp:    SpO2: 99%     Last Pain:  Vitals:   06/22/21 0855  TempSrc:   PainSc: 0-No pain                 Precious Haws Donne Baley

## 2021-06-22 NOTE — Anesthesia Preprocedure Evaluation (Signed)
Anesthesia Evaluation  Patient identified by MRN, date of birth, ID band Patient awake    Reviewed: Allergy & Precautions, NPO status , Patient's Chart, lab work & pertinent test results  History of Anesthesia Complications (+) PONV and history of anesthetic complications  Airway Mallampati: III  TM Distance: <3 FB Neck ROM: full    Dental  (+) Chipped, Poor Dentition, Missing, Partial Lower   Pulmonary neg shortness of breath, former smoker,    Pulmonary exam normal        Cardiovascular Exercise Tolerance: Good hypertension, (-) anginaNormal cardiovascular exam     Neuro/Psych PSYCHIATRIC DISORDERS negative neurological ROS     GI/Hepatic Neg liver ROS, GERD  Controlled,  Endo/Other  negative endocrine ROS  Renal/GU negative Renal ROS  negative genitourinary   Musculoskeletal   Abdominal   Peds  Hematology negative hematology ROS (+)   Anesthesia Other Findings Past Medical History: No date: Arthritis No date: GERD (gastroesophageal reflux disease) No date: Hypertension No date: PONV (postoperative nausea and vomiting)  Past Surgical History: 01/1999: ABDOMINAL HYSTERECTOMY 1974: CYST EXCISION 07/2010: KNEE SURGERY; Right 10/12/2012: TOTAL KNEE ARTHROPLASTY; Right 02/11/2016: TOTAL KNEE ARTHROPLASTY WITH REVISION COMPONENTS; Right     Comment:  Procedure: RIGHT TOTAL KNEE ARTHROPLASTY REVISION;                Surgeon: Gaynelle Arabian, MD;  Location: [facility];  Service:               Orthopedics;  Laterality: Right; 1984: TUBAL LIGATION  BMI    Body Mass Index: 41.57 kg/m      Reproductive/Obstetrics negative OB ROS                             Anesthesia Physical Anesthesia Plan  ASA: 3  Anesthesia Plan: General   Post-op Pain Management:    Induction: Intravenous  PONV Risk Score and Plan: Propofol infusion and TIVA  Airway Management Planned: Natural Airway and Nasal  Cannula  Additional Equipment:   Intra-op Plan:   Post-operative Plan:   Informed Consent: I have reviewed the patients History and Physical, chart, labs and discussed the procedure including the risks, benefits and alternatives for the proposed anesthesia with the patient or authorized representative who has indicated his/her understanding and acceptance.     Dental Advisory Given  Plan Discussed with: Anesthesiologist, CRNA and Surgeon  Anesthesia Plan Comments: (Patient consented for risks of anesthesia including but not limited to:  - adverse reactions to medications - risk of airway placement if required - damage to eyes, teeth, lips or other oral mucosa - nerve damage due to positioning  - sore throat or hoarseness - Damage to heart, brain, nerves, lungs, other parts of body or loss of life  Patient voiced understanding.)        Anesthesia Quick Evaluation

## 2021-06-22 NOTE — Op Note (Signed)
[facility] Gastroenterology Patient Name: Leslie Koch Procedure Date: 06/22/2021 7:13 AM MRN: [id number] Account #: [id number] Date of Birth: 07-19-1955 Admit Type: Outpatient Age: 66 Room: [facility] ENDO ROOM 4 Gender: Female Note Status: Finalized Instrument Name: Jasper Riling [id number] Procedure:             Colonoscopy Indications:           Screening for colorectal malignant neoplasm, Last                         colonoscopy 10 years ago Providers:             Lin Landsman MD, MD Referring MD:          Jaci Standard. Rollene Rotunda (Referring MD) Medicines:             General Anesthesia Complications:         No immediate complications. Estimated blood loss: None. Procedure:             Pre-Anesthesia Assessment:                        - Prior to the procedure, a History and Physical was                         performed, and patient medications and allergies were                         reviewed. The patient is competent. The risks and                         benefits of the procedure and the sedation options and                         risks were discussed with the patient. All questions                         were answered and informed consent was obtained.                         Patient identification and proposed procedure were                         verified by the physician, the nurse, the                         anesthesiologist, the anesthetist and the technician                         in the pre-procedure area in the procedure room in the                         endoscopy suite. Mental Status Examination: alert and                         oriented. Airway Examination: normal oropharyngeal                         airway and neck mobility. Respiratory Examination:  clear to auscultation. CV Examination: normal.                         Prophylactic Antibiotics: The patient does not require                         prophylactic antibiotics.  Prior Anticoagulants: The                         patient has taken no previous anticoagulant or                         antiplatelet agents. ASA Grade Assessment: III - A                         patient with severe systemic disease. After reviewing                         the risks and benefits, the patient was deemed in                         satisfactory condition to undergo the procedure. The                         anesthesia plan was to use general anesthesia.                         Immediately prior to administration of medications,                         the patient was re-assessed for adequacy to receive                         sedatives. The heart rate, respiratory rate, oxygen                         saturations, blood pressure, adequacy of pulmonary                         ventilation, and response to care were monitored                         throughout the procedure. The physical status of the                         patient was re-assessed after the procedure.                        After obtaining informed consent, the colonoscope was                         passed under direct vision. Throughout the procedure,                         the patient's blood pressure, pulse, and oxygen                         saturations were monitored continuously. The  Colonoscope was introduced through the anus and                         advanced to the the cecum, identified by appendiceal                         orifice and ileocecal valve. The colonoscopy was                         technically difficult and complex due to multiple                         diverticula in the colon. Successful completion of the                         procedure was aided by applying abdominal pressure.                         The patient tolerated the procedure well. The quality                         of the bowel preparation was evaluated using the BBPS                          [facility] Bowel Preparation Scale) with scores of: Right                         Colon = 3, Transverse Colon = 3 and Left Colon = 3                         (entire mucosa seen well with no residual staining,                         small fragments of stool or opaque liquid). The total                         BBPS score equals 9. Findings:      The perianal and digital rectal examinations were normal. Pertinent       negatives include normal sphincter tone and no palpable rectal lesions.      Two sessile polyps were found in the distal rectum and ascending colon.       The polyps were 3 to 4 mm in size. These polyps were removed with a cold       snare. Resection and retrieval were complete. Estimated blood loss: none.      Multiple diverticula were found in the recto-sigmoid colon and sigmoid       colon.      Non-bleeding external hemorrhoids were found during retroflexion. The       hemorrhoids were medium-sized.      The terminal ileum appeared normal. Impression:            - Two 3 to 4 mm polyps in the distal rectum and in the                         ascending colon, removed with a cold snare. Resected  and retrieved.                        - Diverticulosis in the recto-sigmoid colon and in the                         sigmoid colon.                        - Non-bleeding external hemorrhoids.                        - The examined portion of the ileum was normal. Recommendation:        - Discharge patient to home (with escort).                        - Resume previous diet today.                        - Continue present medications.                        - Await pathology results.                        - Repeat colonoscopy in 7 years for surveillance based                         on pathology results. Procedure Code(s):     --- Professional ---                        [phone], Colonoscopy, flexible; with removal of                         tumor(s), polyp(s), or  other lesion(s) by snare                         technique Diagnosis Code(s):     --- Professional ---                        Z12.11, Encounter for screening for malignant neoplasm                         of colon                        K62.1, Rectal polyp                        K63.5, Polyp of colon                        K64.4, Residual hemorrhoidal skin tags                        K57.30, Diverticulosis of large intestine without                         perforation or abscess without bleeding CPT copyright 2019 American Medical Association. All rights reserved. The codes documented in this report are preliminary and upon coder review may  be revised to  meet current compliance requirements. Dr. Ulyess Mort Lin Landsman MD, MD 06/22/2021 8:25:51 AM This report has been signed electronically. Number of Addenda: 0 Note Initiated On: 06/22/2021 7:13 AM Scope Withdrawal Time: 0 hours 13 minutes 2 seconds  Total Procedure Duration: 0 hours 23 minutes 15 seconds  Estimated Blood Loss:  Estimated blood loss: none. Estimated blood loss: none.      [facility]

## 2021-06-22 NOTE — Transfer of Care (Signed)
Immediate Anesthesia Transfer of Care Note  Patient: Leslie Koch  Procedure(s) Performed: COLONOSCOPY WITH PROPOFOL  Patient Location: PACU  Anesthesia Type:MAC  Level of Consciousness: drowsy  Airway & Oxygen Therapy: Patient Spontanous Breathing and Patient connected to nasal cannula oxygen  Post-op Assessment: Report given to RN and Post -op Vital signs reviewed and stable  Post vital signs: Reviewed and stable  Last Vitals:  Vitals Value Taken Time  BP    Temp    Pulse    Resp    SpO2      Last Pain:  Vitals:   06/22/21 0703  TempSrc: Temporal  PainSc: 0-No pain         Complications: No notable events documented.

## 2021-06-23 ENCOUNTER — Encounter: Payer: Self-pay | Admitting: Gastroenterology

## 2021-06-23 LAB — SURGICAL PATHOLOGY

## 2021-06-24 ENCOUNTER — Telehealth: Payer: Self-pay

## 2021-06-24 ENCOUNTER — Encounter: Payer: Self-pay | Admitting: Gastroenterology

## 2021-06-24 LAB — HM COLONOSCOPY

## 2021-06-24 NOTE — Telephone Encounter (Signed)
Left vm for pt to return my call to schedule a new pt banding appt.

## 2021-06-24 NOTE — Telephone Encounter (Signed)
-----   Message from Lin Landsman, MD sent at 06/22/2021  8:39 AM EDT ----- Regarding: banding Please schedule new pt appt for hemorrhoid banding, ok to over book  Thanks RV

## 2021-06-30 NOTE — Telephone Encounter (Signed)
Patient does not want to have the procedure at this. Explained the procedure to patient and patient understood the procedure and she will give [place] a call when she decides to do the procedure.

## 2021-07-30 DIAGNOSIS — M722 Plantar fascial fibromatosis: Secondary | ICD-10-CM | POA: Diagnosis not present

## 2021-08-10 ENCOUNTER — Other Ambulatory Visit: Payer: Self-pay | Admitting: Family Medicine

## 2021-08-10 DIAGNOSIS — M2550 Pain in unspecified joint: Secondary | ICD-10-CM

## 2021-08-10 NOTE — Telephone Encounter (Signed)
Medication Refill - Medication: celecoxib (CELEBREX) 200 MG capsule [[id number]]  95 day supply   Has the patient contacted their pharmacy? Yes.   (Agent: If no, request that the patient contact the pharmacy for the refill. If patient does not wish to contact the pharmacy document the reason why and proceed with request.) (Agent: If yes, when and what did the pharmacy advise?) they will send a request   Preferred Pharmacy (with phone number or street name): [id number] [place] [address] [id number] Phone:  [phone] Fax:  [phone] DEA #:  [id number] the patient been seen for an appointment in the last year OR does the patient have an upcoming appointment? Yes.    Agent: Please be advised that RX refills may take up to 3 business days. We ask that you follow-up with your pharmacy.

## 2021-08-11 ENCOUNTER — Other Ambulatory Visit: Payer: Self-pay | Admitting: Family Medicine

## 2021-08-11 DIAGNOSIS — M2550 Pain in unspecified joint: Secondary | ICD-10-CM

## 2021-08-11 MED ORDER — CELECOXIB 200 MG PO CAPS: 200.0000 mg | ORAL_CAPSULE | Freq: Every day | ORAL | 0 refills | Status: DC

## 2021-08-11 NOTE — Telephone Encounter (Signed)
Requested Prescriptions  Pending Prescriptions Disp Refills  . celecoxib (CELEBREX) 200 MG capsule 90 capsule 0    Sig: Take 1 capsule (200 mg total) by mouth daily.     Analgesics:  COX2 Inhibitors Failed - 08/10/2021 11:44 AM      Failed - Manual Review: Labs are only required if the patient has taken medication for more than 8 weeks.      Failed - HGB in normal range and within 360 days    Hemoglobin  Date Value Ref Range Status  03/03/2020 14.1 11.1 - 15.9 g/dL Final         Failed - HCT in normal range and within 360 days    Hematocrit  Date Value Ref Range Status  03/03/2020 41.9 34.0 - 46.6 % Final         Failed - AST in normal range and within 360 days    AST  Date Value Ref Range Status  03/03/2020 15 0 - 40 IU/L Final         Failed - ALT in normal range and within 360 days    ALT  Date Value Ref Range Status  03/03/2020 10 0 - 32 IU/L Final         Passed - Cr in normal range and within 360 days    Creatinine  Date Value Ref Range Status  11/28/2013 0.73 0.60 - 1.30 mg/dL Final   Creatinine, Ser  Date Value Ref Range Status  11/03/2020 0.80 0.44 - 1.00 mg/dL Final         Passed - eGFR is 30 or above and within 360 days    EGFR (African American)  Date Value Ref Range Status  11/28/2013 >60 >57mL/min Final  10/13/2012 >60  Final   GFR calc Af Amer  Date Value Ref Range Status  03/03/2020 107 >59 mL/min/1.73 Final    Comment:    **In accordance with recommendations from the NKF-ASN Task force,**   [facility] is in the process of updating its eGFR calculation to the   2021 CKD-EPI creatinine equation that estimates kidney function   without a race variable.    EGFR (Non-African Amer.)  Date Value Ref Range Status  11/28/2013 >60 >37mL/min Final    Comment:    eGFR values <2mL/min/1.73 m2 may be an indication of chronic kidney disease (CKD). Calculated eGFR, using the MRDR Study equation, is useful in  patients with stable renal function. The  eGFR calculation will not be reliable in acutely ill patients when serum creatinine is changing rapidly. It is not useful in patients on dialysis. The eGFR calculation may not be applicable to patients at the low and high extremes of body sizes, pregnant women, and vegetarians.   10/13/2012 >60  Final    Comment:    eGFR values <79mL/min/1.73 m2 may be an indication of chronic kidney disease (CKD). Calculated eGFR is useful in patients with stable renal function. The eGFR calculation will not be reliable in acutely ill patients when serum creatinine is changing rapidly. It is not useful in  patients on dialysis. The eGFR calculation may not be applicable to patients at the low and high extremes of body sizes, pregnant women, and vegetarians.    GFR calc non Af Amer  Date Value Ref Range Status  03/03/2020 93 >59 mL/min/1.73 Final   eGFR  Date Value Ref Range Status  09/01/2020 97 >59 mL/min/1.73 Final         Passed - Patient is not  pregnant      Passed - Valid encounter within last 12 months    Recent Outpatient Visits          3 months ago Welcome to [employer] preventive visit   [facility] Gwyneth Sprout, FNP   11 months ago Essential hypertension   [facility] [place], Dionne Bucy, MD   1 year ago Primary hypertension   [facility] [place], [name]   2 years ago Pneumonia due to COVID-19 virus   [facility], [name], [name]   2 years ago Encounter for support and coordination of transition of care   [place], [name], [name]      Future Appointments            In 2 months Gwyneth Sprout, [place], [place]

## 2021-08-12 NOTE — Telephone Encounter (Signed)
Requested medication (s) are due for refill today:no  Requested medication (s) are on the active medication list: yes  Last refill:  08/11/21  Future visit scheduled: yes  Notes to clinic:  Unable to refill per protocol, last refilled 08/11/21 for 90 days, possible duplicate.     Requested Prescriptions  Pending Prescriptions Disp Refills   celecoxib (CELEBREX) 200 MG capsule [Pharmacy Med Name: CELECOXIB 200 MG CAPSULE] 90 capsule 1    Sig: TAKE 1 CAPSULE BY MOUTH EVERY DAY     Analgesics:  COX2 Inhibitors Failed - 08/11/2021  2:36 AM      Failed - Manual Review: Labs are only required if the patient has taken medication for more than 8 weeks.      Failed - HGB in normal range and within 360 days    Hemoglobin  Date Value Ref Range Status  03/03/2020 14.1 11.1 - 15.9 g/dL Final         Failed - HCT in normal range and within 360 days    Hematocrit  Date Value Ref Range Status  03/03/2020 41.9 34.0 - 46.6 % Final         Failed - AST in normal range and within 360 days    AST  Date Value Ref Range Status  03/03/2020 15 0 - 40 IU/L Final         Failed - ALT in normal range and within 360 days    ALT  Date Value Ref Range Status  03/03/2020 10 0 - 32 IU/L Final         Passed - Cr in normal range and within 360 days    Creatinine  Date Value Ref Range Status  11/28/2013 0.73 0.60 - 1.30 mg/dL Final   Creatinine, Ser  Date Value Ref Range Status  11/03/2020 0.80 0.44 - 1.00 mg/dL Final         Passed - eGFR is 30 or above and within 360 days    EGFR (African American)  Date Value Ref Range Status  11/28/2013 >60 >56m/min Final  10/13/2012 >60  Final   GFR calc Af Amer  Date Value Ref Range Status  03/03/2020 107 >59 mL/min/1.73 Final    Comment:    **In accordance with recommendations from the NKF-ASN Task force,**   [facility] is in the process of updating its eGFR calculation to the   2021 CKD-EPI creatinine equation that estimates kidney function   without  a race variable.    EGFR (Non-African Amer.)  Date Value Ref Range Status  11/28/2013 >60 >648mmin Final    Comment:    eGFR values <6031min/1.73 m2 may be an indication of chronic kidney disease (CKD). Calculated eGFR, using the MRDR Study equation, is useful in  patients with stable renal function. The eGFR calculation will not be reliable in acutely ill patients when serum creatinine is changing rapidly. It is not useful in patients on dialysis. The eGFR calculation may not be applicable to patients at the low and high extremes of body sizes, pregnant women, and vegetarians.   10/13/2012 >60  Final    Comment:    eGFR values <78m58mn/1.73 m2 may be an indication of chronic kidney disease (CKD). Calculated eGFR is useful in patients with stable renal function. The eGFR calculation will not be reliable in acutely ill patients when serum creatinine is changing rapidly. It is not useful in  patients on dialysis. The eGFR calculation may not be applicable to patients at the low and  high extremes of body sizes, pregnant women, and vegetarians.    GFR calc non Af Amer  Date Value Ref Range Status  03/03/2020 93 >59 mL/min/1.73 Final   eGFR  Date Value Ref Range Status  09/01/2020 97 >59 mL/min/1.73 Final         Passed - Patient is not pregnant      Passed - Valid encounter within last 12 months    Recent Outpatient Visits           3 months ago Welcome to [employer] preventive visit   [facility] Gwyneth Sprout, FNP   11 months ago Essential hypertension   [facility] [place], Dionne Bucy, MD   1 year ago Primary hypertension   [facility] [place], [name]   2 years ago Pneumonia due to COVID-19 virus   [facility], [name], [name]   2 years ago Encounter for support and coordination of transition of care   [place], [name], [name]       Future  Appointments             In 2 months Gwyneth Sprout, [place], [place]

## 2021-09-07 ENCOUNTER — Ambulatory Visit
Admission: RE | Admit: 2021-09-07 | Discharge: 2021-09-07 | Disposition: A | Discharge status: Home / Self Care | Payer: Medicare HMO | Source: Ambulatory Visit | Attending: Family Medicine | Admitting: Family Medicine

## 2021-09-07 DIAGNOSIS — Z780 Asymptomatic menopausal state: Secondary | ICD-10-CM | POA: Diagnosis not present

## 2021-09-07 DIAGNOSIS — Z1231 Encounter for screening mammogram for malignant neoplasm of breast: Secondary | ICD-10-CM | POA: Diagnosis not present

## 2021-09-07 DIAGNOSIS — Z13820 Encounter for screening for osteoporosis: Secondary | ICD-10-CM | POA: Diagnosis not present

## 2021-09-09 NOTE — Progress Notes (Signed)
Hi Leslie Koch  Normal mammogram; repeat in 1 year.  Please let us know if you have any questions.  Thank you,  Tally Joe, FNP

## 2021-09-09 NOTE — Progress Notes (Signed)
Normal bone strength noted. Can use Vit D 800 IU and Calcium 1200 mg to assist regular exercise for bone health. Can repeat DEXA in 3 years if desired.  Gwyneth Sprout, [address] [place], [place] [address] [phone] (phone) [phone] (fax) [place]

## 2021-10-30 ENCOUNTER — Other Ambulatory Visit: Payer: Self-pay

## 2021-10-30 DIAGNOSIS — R232 Flushing: Secondary | ICD-10-CM

## 2021-10-30 MED ORDER — ESTRADIOL 1 MG PO TABS: 2.0000 mg | ORAL_TABLET | Freq: Every day | ORAL | 0 refills | Status: DC

## 2021-10-30 NOTE — Telephone Encounter (Signed)
Patient states you told her to take 2 daily and she did that for a while so she will run out on Monday, needs new refill.  She had an appointment on Tuesday but could not reschedule until 11/23/21.  [facility] [place] if ok

## 2021-11-03 ENCOUNTER — Ambulatory Visit: Payer: Medicare HMO | Admitting: Family Medicine

## 2021-11-06 ENCOUNTER — Other Ambulatory Visit: Payer: Self-pay | Admitting: Family Medicine

## 2021-11-06 DIAGNOSIS — M2550 Pain in unspecified joint: Secondary | ICD-10-CM

## 2021-11-06 DIAGNOSIS — I10 Essential (primary) hypertension: Secondary | ICD-10-CM

## 2021-11-23 ENCOUNTER — Ambulatory Visit: Payer: Medicare HMO | Admitting: Family Medicine

## 2021-11-24 ENCOUNTER — Emergency Department
Admission: EM | Admit: 2021-11-24 | Discharge: 2021-11-24 | Disposition: A | Discharge status: Home / Self Care | Payer: Medicare HMO | Attending: Emergency Medicine | Admitting: Emergency Medicine

## 2021-11-24 ENCOUNTER — Other Ambulatory Visit: Payer: Self-pay

## 2021-11-24 ENCOUNTER — Encounter: Payer: Self-pay | Admitting: Emergency Medicine

## 2021-11-24 ENCOUNTER — Emergency Department: Payer: Medicare HMO

## 2021-11-24 DIAGNOSIS — M79604 Pain in right leg: Secondary | ICD-10-CM | POA: Diagnosis not present

## 2021-11-24 DIAGNOSIS — I10 Essential (primary) hypertension: Secondary | ICD-10-CM | POA: Diagnosis not present

## 2021-11-24 DIAGNOSIS — M7989 Other specified soft tissue disorders: Secondary | ICD-10-CM | POA: Diagnosis not present

## 2021-11-24 DIAGNOSIS — M79661 Pain in right lower leg: Secondary | ICD-10-CM | POA: Diagnosis not present

## 2021-11-24 LAB — BASIC METABOLIC PANEL
Anion gap: 9 (ref 5–15)
BUN: 15 mg/dL (ref 8–23)
CO2: 25 mmol/L (ref 22–32)
Calcium: 9.1 mg/dL (ref 8.9–10.3)
Chloride: 105 mmol/L (ref 98–111)
Creatinine, Ser: 0.71 mg/dL (ref 0.44–1.00)
GFR, Estimated: >60 mL/min (ref >60)
Glucose, Bld: 99 mg/dL (ref 70–99)
Potassium: 3.8 mmol/L (ref 3.5–5.1)
Sodium: 139 mmol/L (ref 135–145)

## 2021-11-24 LAB — CBC
HCT: 41.9 % (ref 36.0–46.0)
Hemoglobin: 14.3 g/dL (ref 12.0–15.0)
MCH: 30.6 pg (ref 26.0–34.0)
MCHC: 34.1 g/dL (ref 30.0–36.0)
MCV: 89.7 fL (ref 80.0–100.0)
Platelets: 304 10*3/uL (ref 150–400)
RBC: 4.67 MIL/uL (ref 3.87–5.11)
RDW: 13.3 % (ref 11.5–15.5)
WBC: 7.3 10*3/uL (ref 4.0–10.5)
nRBC: 0 % (ref 0.0–0.2)

## 2021-11-24 NOTE — ED Provider Notes (Signed)
   [facility] Provider Note    Event Date/Time   First MD Initiated Contact with Patient 11/24/21 1047     (approximate)   History   Leg pain   HPI  Leslie Koch is a 66 y.o. female with a history of hypertension who presents with complaints of right calf pain.  She reports is been bothering her for about 4 days now.  No injury to the area.  No history of blood clots.  No skin break or signs of infection     Physical Exam   Triage Vital Signs: ED Triage Vitals  Enc Vitals Group     BP 11/24/21 1050 (!) 156/109     Pulse Rate 11/24/21 1050 78     Resp 11/24/21 1050 18     Temp 11/24/21 1050 98 F (36.7 C)     Temp Source 11/24/21 1050 Oral     SpO2 11/24/21 1050 98 %     Weight 11/24/21 1047 99 kg (218 lb 4.1 oz)     Height 11/24/21 1047 1.549 m ('5\' 1"'$ )     Head Circumference --      Peak Flow --      Pain Score 11/24/21 1059 0     Pain Loc --      Pain Edu? --      Excl. in [place]? --     Most recent vital signs: Vitals:   11/24/21 1050  BP: (!) 156/109  Pulse: 78  Resp: 18  Temp: 98 F (36.7 C)  SpO2: 98%     General: Awake, no distress.  CV:  Good peripheral perfusion.  Resp:  Normal effort.  Abd:  No distention.  Other:  Right calf: Tenderness to palpation that is mild, no erythema, mild swelling   ED Results / Procedures / Treatments   Labs (all labs ordered are listed, but only abnormal results are displayed) Labs Reviewed  CBC  BASIC METABOLIC PANEL     EKG     RADIOLOGY Ultrasound pending    PROCEDURES:  Critical Care performed:   Procedures   MEDICATIONS ORDERED IN ED: Medications - No data to display   IMPRESSION / MDM / [place] / ED COURSE  I reviewed the triage vital signs and the nursing notes. Patient's presentation is most consistent with acute presentation with potential threat to life or bodily function.  Patient presents with right calf pain and swelling.  Differential  includes DVT versus musculoskeletal pain  We will check labs, send for ultrasound of the right lower extremity.  Lab work is unremarkable.  Ultrasound is negative for DVT, suspect musculoskeletal injury, recommend supportive care, appropriate for discharge at this time, no indication for admission       FINAL CLINICAL IMPRESSION(S) / ED DIAGNOSES   Final diagnoses:  Right leg pain     Rx / DC Orders   ED Discharge Orders     None        Note:  This document was prepared using Dragon voice recognition software and may include unintentional dictation errors.   Lavonia Drafts, MD 11/24/21 1230

## 2021-11-24 NOTE — Discharge Instructions (Signed)
Your ultrasound was negative for blood clot

## 2021-11-24 NOTE — ED Triage Notes (Signed)
Presents with right lower leg pain for 4 days  denies any injury

## 2021-11-26 ENCOUNTER — Telehealth: Payer: Self-pay

## 2021-11-26 NOTE — Telephone Encounter (Signed)
Transition Care Management Follow-up Telephone Call Date of discharge and from where: [facility] 11-25-21 Dx: Right leg pain How have you been since you were released from the hospital? [facility] but leg is still is still sore  Any questions or concerns? No  Items Reviewed: Did the pt receive and understand the discharge instructions provided? Yes  Medications obtained and verified? Yes  Other? No  Any new allergies since your discharge? No  Dietary orders reviewed? Yes Do you have support at home? Yes   Follow up appointments reviewed:  PCP Hospital f/u appt confirmed? Yes  Scheduled to see Tally Joe FNP on 12-07-21 @ [facility] f/u appt confirmed? No  . Are transportation arrangements needed? No  If their condition worsens, is the pt aware to call PCP or go to the Emergency Dept.? Yes Was the patient provided with contact information for the PCP's office or ED? Yes Was to pt encouraged to call back with questions or concerns? Yes   Juanda Crumble LPN [place] Direct Dial [phone]

## 2021-12-01 ENCOUNTER — Other Ambulatory Visit: Payer: Self-pay | Admitting: Family Medicine

## 2021-12-01 DIAGNOSIS — M2550 Pain in unspecified joint: Secondary | ICD-10-CM

## 2021-12-01 NOTE — Telephone Encounter (Signed)
Requested Prescriptions  Pending Prescriptions Disp Refills   celecoxib (CELEBREX) 200 MG capsule [Pharmacy Med Name: CELECOXIB 200 MG CAPSULE] 90 capsule 0    Sig: TAKE 1 CAPSULE BY MOUTH EVERY DAY     Analgesics:  COX2 Inhibitors Failed - 12/01/2021  9:49 AM      Failed - Manual Review: Labs are only required if the patient has taken medication for more than 8 weeks.      Failed - AST in normal range and within 360 days    AST  Date Value Ref Range Status  03/03/2020 15 0 - 40 IU/L Final         Failed - ALT in normal range and within 360 days    ALT  Date Value Ref Range Status  03/03/2020 10 0 - 32 IU/L Final         Failed - Valid encounter within last 12 months    Recent Outpatient Visits           7 months ago Welcome to [employer] preventive visit   [facility] Tally Joe T, FNP   1 year ago Essential hypertension   [place], Dionne Bucy, [place]   1 year ago Primary hypertension   [place], [name], [name]   2 years ago Pneumonia due to COVID-19 virus   [facility], [name], [name]   2 years ago Encounter for support and coordination of transition of care   [place], [name], [name]       Future Appointments             In 6 days Gwyneth Sprout, [place], [facility]            Passed - HGB in normal range and within 360 days    Hemoglobin  Date Value Ref Range Status  11/24/2021 14.3 12.0 - 15.0 g/dL Final  03/03/2020 14.1 11.1 - 15.9 g/dL Final         Passed - Cr in normal range and within 360 days    Creatinine  Date Value Ref Range Status  11/28/2013 0.73 0.60 - 1.30 mg/dL Final   Creatinine, Ser  Date Value Ref Range Status  11/24/2021 0.71 0.44 - 1.00 mg/dL Final         Passed - HCT in normal range and within 360 days    HCT  Date Value Ref Range Status  11/24/2021 41.9 36.0 - 46.0 % Final   Hematocrit   Date Value Ref Range Status  03/03/2020 41.9 34.0 - 46.6 % Final         Passed - eGFR is 30 or above and within 360 days    EGFR (African American)  Date Value Ref Range Status  11/28/2013 >60 >71m/min Final  10/13/2012 >60  Final   GFR calc Af Amer  Date Value Ref Range Status  03/03/2020 107 >59 mL/min/1.73 Final    Comment:    **In accordance with recommendations from the NKF-ASN Task force,**   [facility] is in the process of updating its eGFR calculation to the   2021 CKD-EPI creatinine equation that estimates kidney function   without a race variable.    EGFR (Non-African Amer.)  Date Value Ref Range Status  11/28/2013 >60 >621mmin Final    Comment:    eGFR values <6044min/1.73 m2 may be an indication of chronic kidney disease (CKD). Calculated eGFR, using the MRDR Study equation,  is useful in  patients with stable renal function. The eGFR calculation will not be reliable in acutely ill patients when serum creatinine is changing rapidly. It is not useful in patients on dialysis. The eGFR calculation may not be applicable to patients at the low and high extremes of body sizes, pregnant women, and vegetarians.   10/13/2012 >60  Final    Comment:    eGFR values <16m/min/1.73 m2 may be an indication of chronic kidney disease (CKD). Calculated eGFR is useful in patients with stable renal function. The eGFR calculation will not be reliable in acutely ill patients when serum creatinine is changing rapidly. It is not useful in  patients on dialysis. The eGFR calculation may not be applicable to patients at the low and high extremes of body sizes, pregnant women, and vegetarians.    GFR, Estimated  Date Value Ref Range Status  11/24/2021 >60 >60 mL/min Final    Comment:    (NOTE) Calculated using the CKD-EPI Creatinine Equation (2021)    eGFR  Date Value Ref Range Status  09/01/2020 97 >59 mL/min/1.73 Final         Passed - Patient is not pregnant

## 2021-12-07 ENCOUNTER — Ambulatory Visit: Payer: Medicare HMO | Admitting: Family Medicine

## 2021-12-11 NOTE — Progress Notes (Unsigned)
I,Sha'taria Blessings Inglett,acting as a Education administrator for Myles Gip, DO.,have documented all relevant documentation on the behalf of Myles Gip, DO,as directed by  Myles Gip, DO while in the presence of Myles Gip, DO.   Established patient visit   Patient: Leslie Koch   DOB: 12-28-1955   66 y.o. Female  MRN: [id number] Visit Date: 12/14/2021  Today's healthcare provider: Myles Gip, DO   No chief complaint on file.  Subjective    HPI  Hypertension, follow-up  BP Readings from Last 3 Encounters:  12/14/21 133/83  11/24/21 (!) 156/109  06/22/21 118/78   Wt Readings from Last 3 Encounters:  12/14/21 225 lb (102.1 kg)  11/24/21 218 lb (98.9 kg)  06/22/21 220 lb (99.8 kg)     She was last seen for hypertension 7 months ago.  BP at that visit was 124/73. Management since that visit includes continue medication, Lisinopril 20-12.5 mg .  She reports excellent compliance with treatment. She is not having side effects.  She is following a Low Sodium diet. She is not exercising. She does not smoke.  Use of agents associated with hypertension: lasix, lisinopril-HCTZ.   Outside blood pressures are not being checked. Symptoms: No chest pain No chest pressure  No palpitations No syncope  No dyspnea Yes orthopnea  No paroxysmal nocturnal dyspnea Yes lower extremity edema   Pertinent labs Lab Results  Component Value Date   CHOL 169 06/09/2021   HDL 57 06/09/2021   LDLCALC 86 06/09/2021   TRIG 149 06/09/2021   CHOLHDL 3.0 06/09/2021   Lab Results  Component Value Date   NA 139 11/24/2021   K 3.8 11/24/2021   CREATININE 0.71 11/24/2021   GFRNONAA >60 11/24/2021   GLUCOSE 99 11/24/2021   TSH 1.850 01/23/2016     The 10-year ASCVD risk score (Arnett DK, et al., 2019) is: 7.2%  ---------------------------------------------------------------------------------------------------   Calf pain - ED visit 11/14. Negative w/u at that time including DVT  US. Reports pain is better. Has Ortho appt 12/21.  Medications: Outpatient Medications Prior to Visit  Medication Sig   albuterol (VENTOLIN HFA) 108 (90 Base) MCG/ACT inhaler Inhale 2 puffs into the lungs every 6 (six) hours as needed for wheezing or shortness of breath.   aspirin EC 81 MG tablet Take 81 mg by mouth daily.    Calcium Carbonate-Vitamin D (CALCIUM 500/D PO) Take 1 tablet by mouth daily.   cetirizine (ZYRTEC) 10 MG tablet Take 10 mg by mouth daily.    Cholecalciferol (VITAMIN D3) 1000 units CAPS Take 1,000 Units by mouth in the morning and at bedtime.   estradiol (ESTRACE) 0.1 MG/GM vaginal cream Place 1 Applicatorful vaginally 3 (three) times a week.   fluticasone (FLONASE) 50 MCG/ACT nasal spray Place 2 sprays into both nostrils daily.   furosemide (LASIX) 40 MG tablet TAKE 1 TABLET BY MOUTH TWICE A DAY AS NEEDED (Patient taking differently: Once daily as needed)   Glucosamine-Chondroitin 750-600 MG TABS Take 1 tablet by mouth in the morning and at bedtime.   lisinopril-hydrochlorothiazide (ZESTORETIC) 20-12.5 MG tablet TAKE 1 TABLET BY MOUTH EVERY DAY   Multiple Vitamin (MULTIVITAMIN) tablet Take 1 tablet by mouth daily.   Omega-3 1000 MG CAPS Take 2,000 mg by mouth daily.   oxymetazoline (AFRIN) 0.05 % nasal spray Place 1-2 sprays into both nostrils 2 (two) times daily as needed for congestion.   potassium chloride (K-DUR) 10 MEQ tablet Take 1 tablet (10 mEq total)  by mouth 2 (two) times daily as needed.   senna-docusate (SENOKOT-S) 8.6-50 MG tablet Take 1 tablet by mouth at bedtime.   simvastatin (ZOCOR) 40 MG tablet TAKE 1 TABLET BY MOUTH EVERYDAY AT BEDTIME   valACYclovir (VALTREX) 1000 MG tablet Take 1-2 tablets (1,000-2,000 mg total) by mouth every 12 (twelve) hours as needed.   [DISCONTINUED] celecoxib (CELEBREX) 200 MG capsule TAKE 1 CAPSULE BY MOUTH EVERY DAY   [DISCONTINUED] estradiol (ESTRACE) 1 MG tablet Take 2 tablets (2 mg total) by mouth daily.   No  facility-administered medications prior to visit.    Review of Systems     Objective    BP 133/83 (BP Location: Left Arm, Patient Position: Sitting, Cuff Size: Large)   Pulse 83   Wt 225 lb (102.1 kg)   SpO2 95%   BMI 41.15 kg/m    Physical Exam  Gen: well appearing, in NAD Card: RRR Lungs: CTAB Ext: WWP, no edema   No results found for any visits on 12/14/21.  Assessment & Plan     Problem List Items Addressed This Visit       Cardiovascular and Mediastinum   Primary hypertension - Primary    At goal. Doing well on current regimen, no changes made today.        Other   Chronic pain of left knee   Relevant Medications   celecoxib (CELEBREX) 200 MG capsule   Morbid obesity ([address])   Other Visit Diagnoses     Arthralgia, unspecified joint       Relevant Medications   celecoxib (CELEBREX) 200 MG capsule   Hot flash not due to menopause       Relevant Medications   estradiol (ESTRACE) 1 MG tablet        Return in about 6 months (around 06/15/2022) for htn.        Myles Gip, [place] [phone] (phone) [phone] (fax)  [place]

## 2021-12-14 ENCOUNTER — Encounter: Payer: Self-pay | Admitting: Family Medicine

## 2021-12-14 ENCOUNTER — Ambulatory Visit (INDEPENDENT_AMBULATORY_CARE_PROVIDER_SITE_OTHER): Payer: Medicare HMO | Admitting: Family Medicine

## 2021-12-14 VITALS — BP 133/83 | HR 83 | Wt 225.0 lb

## 2021-12-14 DIAGNOSIS — G8929 Other chronic pain: Secondary | ICD-10-CM | POA: Diagnosis not present

## 2021-12-14 DIAGNOSIS — M25562 Pain in left knee: Secondary | ICD-10-CM | POA: Diagnosis not present

## 2021-12-14 DIAGNOSIS — I10 Essential (primary) hypertension: Secondary | ICD-10-CM

## 2021-12-14 DIAGNOSIS — M2550 Pain in unspecified joint: Secondary | ICD-10-CM

## 2021-12-14 DIAGNOSIS — R232 Flushing: Secondary | ICD-10-CM

## 2021-12-14 MED ORDER — ESTRADIOL 1 MG PO TABS: 2.0000 mg | ORAL_TABLET | Freq: Every day | ORAL | 0 refills | Status: DC

## 2021-12-14 MED ORDER — CELECOXIB 200 MG PO CAPS: 200.0000 mg | ORAL_CAPSULE | Freq: Every day | ORAL | 0 refills | Status: DC

## 2021-12-14 NOTE — Assessment & Plan Note (Signed)
At goal. Doing well on current regimen, no changes made today.

## 2021-12-31 DIAGNOSIS — M1712 Unilateral primary osteoarthritis, left knee: Secondary | ICD-10-CM | POA: Diagnosis not present

## 2021-12-31 DIAGNOSIS — M170 Bilateral primary osteoarthritis of knee: Secondary | ICD-10-CM | POA: Diagnosis not present

## 2022-02-16 ENCOUNTER — Ambulatory Visit: Payer: Medicare HMO | Admitting: Physical Therapy

## 2022-02-23 ENCOUNTER — Ambulatory Visit: Payer: Medicare HMO | Attending: Student | Admitting: Physical Therapy

## 2022-02-23 ENCOUNTER — Encounter: Payer: Self-pay | Admitting: Physical Therapy

## 2022-02-23 DIAGNOSIS — M25661 Stiffness of right knee, not elsewhere classified: Secondary | ICD-10-CM

## 2022-02-23 DIAGNOSIS — M25561 Pain in right knee: Secondary | ICD-10-CM | POA: Insufficient documentation

## 2022-02-23 DIAGNOSIS — G8929 Other chronic pain: Secondary | ICD-10-CM | POA: Insufficient documentation

## 2022-02-23 DIAGNOSIS — M25662 Stiffness of left knee, not elsewhere classified: Secondary | ICD-10-CM | POA: Insufficient documentation

## 2022-02-23 DIAGNOSIS — M6281 Muscle weakness (generalized): Secondary | ICD-10-CM | POA: Diagnosis not present

## 2022-02-23 DIAGNOSIS — M25562 Pain in left knee: Secondary | ICD-10-CM | POA: Insufficient documentation

## 2022-02-23 DIAGNOSIS — R269 Unspecified abnormalities of gait and mobility: Secondary | ICD-10-CM | POA: Insufficient documentation

## 2022-02-23 NOTE — Therapy (Signed)
OUTPATIENT PHYSICAL THERAPY LOWER EXTREMITY EVALUATION   Patient Name: Leslie Koch MRN: [id number] DOB:Nov 06, 1955, 67 y.o., female Today's Date: 02/23/2022  END OF SESSION:  PT End of Session - 02/23/22 0946     Visit Number 1    Number of Visits 13    Date for PT Re-Evaluation 04/06/22    PT Start Time 0947    PT Stop Time 1034    PT Time Calculation (min) 47 min             Past Medical History:  Diagnosis Date   Arthritis    GERD (gastroesophageal reflux disease)    Hypertension    PONV (postoperative nausea and vomiting)    Past Surgical History:  Procedure Laterality Date   ABDOMINAL HYSTERECTOMY  01/1999   COLONOSCOPY WITH PROPOFOL N/A 06/22/2021   Procedure: COLONOSCOPY WITH PROPOFOL;  Surgeon: Lin Landsman, MD;  Location: [facility];  Service: Gastroenterology;  Laterality: N/A;   CYST EXCISION  1974   KNEE SURGERY Right 07/2010   TOTAL KNEE ARTHROPLASTY Right 10/12/2012   TOTAL KNEE ARTHROPLASTY WITH REVISION COMPONENTS Right 02/11/2016   Procedure: RIGHT TOTAL KNEE ARTHROPLASTY REVISION;  Surgeon: Gaynelle Arabian, MD;  Location: [facility];  Service: Orthopedics;  Laterality: Right;   TUBAL LIGATION  1984   Patient Active Problem List   Diagnosis Date Noted   Adenomatous polyp of ascending colon    Screening for colon cancer 05/05/2021   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 Wynelle Link)  L knee osteoarthritis  THERAPY DIAG:  Chronic pain of right knee  Chronic pain of left  knee  Joint stiffness of both knees  Muscle weakness (generalized)  Gait difficulty  Rationale for Evaluation and Treatment: Rehabilitation  ONSET DATE: 2014  SUBJECTIVE:   SUBJECTIVE STATEMENT: Pt. Reports chronic R knee issues and had TKA in 2014 and 2 revisions.  Pt. Had 1st revision by Dr. Rudene Christians and 2nd revision by Dr. Wynelle Link.  Increase B knee pain with walking/ yardwork.  Pt. Reports pain ranges from 6-10/10 B knee pain.  Pt. Reports no pain at rest.  Pt. Hoping to have L TKA in 2 years because she takes care of grandson at this time.  Pt. Has h/o L knee injections but not for past 1.5 years.  Pt. Retired from work and lives with husband.  Pt. Cares for mother (next door neighbor) and grandson.  L carpal tunnel syndrome and wearing brace.  R thumb pain in joint.     PERTINENT HISTORY: See MD notes.    PAIN:  Are you having pain? Yes: NPRS scale: 0/10 Pain location: R knee/ L knee Pain description: sharp/ shooting/ stabbing/ burning Aggravating factors: Increase activity Relieving factors: Ice/ Celebrex  PRECAUTIONS: None  WEIGHT BEARING RESTRICTIONS: No  FALLS:  Has patient fallen in last 6 months? No  LIVING ENVIRONMENT: Lives with: lives with their family Lives in: House/apartment Stairs: Yes: External: 3-4 steps; on right going up Has following equipment at home: Single point cane, Walker - 2 wheeled, and  Grab bars  OCCUPATION: Retired  PLOF: Independent  PATIENT GOALS: Improve B LE muscle strength/ decrease pain.    NEXT MD VISIT: 03/26/22  OBJECTIVE:   DIAGNOSTIC FINDINGS: See MD notes  PATIENT SURVEYS:  FOTO initial 43/ goal 50  COGNITION: Overall cognitive status: Within functional limits for tasks assessed     SENSATION: WFL  EDEMA:  Circumferential: L knee joint (49 cm.)/ R knee joint (49 cm.).  10 cm inf. To patella for L mid-gastroc (42 cm.), R mid-gastroc (44 cm.).   MUSCLE LENGTH: Hamstrings: Right 86 deg; Left 86 deg Thomas test:  TBD POSTURE: No Significant postural limitations, rounded shoulders, and forward head  PALPATION: No joint line tenderness.    LOWER EXTREMITY ROM:  Active ROM Right eval Left eval  Hip flexion 100 deg 102 deg.   Hip extension    Hip abduction [facility]  Hip adduction    Hip internal rotation    Hip external rotation    Knee flexion 95 deg. 94 deg.  Knee extension 0 deg.  -10 deg.  Ankle dorsiflexion    Ankle plantarflexion    Ankle inversion    Ankle eversion     (Blank rows = not tested)  LOWER EXTREMITY MMT:  MMT Right eval Left eval  Hip flexion 4-/5 4-/5  Hip extension    Hip abduction 4+/5 4/5  Hip adduction 4/5 4/5  Hip internal rotation 4/5 4/5  Hip external rotation 4/5 4/5  Knee flexion 5/5 (cramp) 5/5  Knee extension 4-/5 4/5  Ankle dorsiflexion    Ankle plantarflexion    Ankle inversion    Ankle eversion     (Blank rows = not tested)  FUNCTIONAL TESTS:  TBD  GAIT: Distance walked: 60 ft. Assistive device utilized: None Level of assistance: Complete Independence Comments: Pt. Walked approx. 60 ft. In hallway and was cued to demonstrate her typical gait. Pt. Showed L antalgic gait showing that she favors her R leg over her left which is currently leading to compensation issues in her R knee. Pt. Has decreased stance time/step-length on her L LE compared to her R LE.  Limited to no arm swing until cued by PT.     TODAY'S TREATMENT:                                                                                                                              DATE: 02/23/2022    Evaluation/ see HEP.   PATIENT EDUCATION:  Education details: Pt. Educated on issued HEP via demonstration, verbal cues, and handouts. Pt. Educated on her strength and mobility deficits found during PT intial EVAL. Pt. Educated on frequency and POC moving forward.    Person educated: Patient Education method: Explanation, Demonstration, and Handouts Education comprehension:  verbalized understanding and returned demonstration  HOME EXERCISE PROGRAM: Access Code: [email or web address] URL: [email or web address] Date: 02/23/2022 Prepared by: Dorcas Carrow  Exercises - Supine Single Knee to Chest Stretch  -  1 x daily - 7 x weekly - 3 sets - 10 reps - Supine March  - 1 x daily - 5 x weekly - 3 sets - 10 reps - Mini Squat with Counter Support  - 1 x daily - 5 x weekly - 3 sets - 10 reps - Standing Hip Abduction with Counter Support  - 1 x daily - 7 x weekly - 3 sets - 10 reps  ASSESSMENT:  CLINICAL IMPRESSION: Patient is a pleasant 67 y.o. female who was seen today for physical therapy evaluation and treatment for B knee pain/ muscle weakness.  Pt. Presents with stiffness and muscle weakness in B knees. Pt. Will benefit from skilled PT services to increase B knee ROM/ LE stability to improve pain-free mobility/ gait.    OBJECTIVE IMPAIRMENTS: Abnormal gait, decreased activity tolerance, decreased endurance, decreased mobility, difficulty walking, decreased ROM, decreased strength, hypomobility, and pain.   ACTIVITY LIMITATIONS: carrying, lifting, bending, squatting, sleeping, and stairs  PARTICIPATION LIMITATIONS: cleaning and yard work  PERSONAL FACTORS: Age, Past/current experiences, and 1-2 comorbidities: Previous TKA w/ revisions, COVID asthma  are also affecting patient's functional outcome.   REHAB POTENTIAL: Good  CLINICAL DECISION MAKING: Stable/uncomplicated  EVALUATION COMPLEXITY: Low   GOALS: Goals reviewed with patient? Yes  SHORT TERM GOALS: Target date: 03/16/2022 Pt. Will be independent with HEP to improve strength and decrease B knee pain to improve overall pain-free function.  Baseline: Just issued.  Goal status: INITIAL  LONG TERM GOALS: Target date: 04/06/2022  Pt. Will increase FOTO to at least 55 to demonstrate increased function/endurance of B knees and increase overall activity level.  Baseline: 43 Goal status:  INITIAL  2.  Pt. will be able to demonstrate a normalized gait pattern with low complaints of pain(<2/10 NPS) to avoid compensation of the R LE/Low back .  Baseline: See gait section.  Goal status: INITIAL  3.  Pt. Will increase LE strength a half muscle grade to improve B LE function and decrease load on joint to decrease B knee joint pain.  Baseline: 4-/5 Goal status: INITIAL   PLAN:  PT FREQUENCY: 2x/week  PT DURATION: 6 weeks  PLANNED INTERVENTIONS: Therapeutic exercises, Therapeutic activity, Neuromuscular re-education, Balance training, Gait training, Patient/Family education, Self Care, Joint mobilization, Joint manipulation, Stair training, Dry Needling, Cryotherapy, Moist heat, scar mobilization, Traction, and Manual therapy  PLAN FOR NEXT SESSION: Begin LE strengthening/endurance program. Assess/Update HEP.   Pura Spice, PT, DPT # [phone] 02/23/2022, 12:09 PM

## 2022-02-25 ENCOUNTER — Ambulatory Visit: Payer: Medicare HMO | Admitting: Physical Therapy

## 2022-02-25 DIAGNOSIS — M25661 Stiffness of right knee, not elsewhere classified: Secondary | ICD-10-CM | POA: Diagnosis not present

## 2022-02-25 DIAGNOSIS — R269 Unspecified abnormalities of gait and mobility: Secondary | ICD-10-CM

## 2022-02-25 DIAGNOSIS — M6281 Muscle weakness (generalized): Secondary | ICD-10-CM | POA: Diagnosis not present

## 2022-02-25 DIAGNOSIS — M25662 Stiffness of left knee, not elsewhere classified: Secondary | ICD-10-CM | POA: Diagnosis not present

## 2022-02-25 DIAGNOSIS — G8929 Other chronic pain: Secondary | ICD-10-CM

## 2022-02-25 DIAGNOSIS — M25561 Pain in right knee: Secondary | ICD-10-CM | POA: Diagnosis not present

## 2022-02-25 DIAGNOSIS — M25562 Pain in left knee: Secondary | ICD-10-CM | POA: Diagnosis not present

## 2022-02-25 NOTE — Therapy (Signed)
OUTPATIENT PHYSICAL THERAPY LOWER EXTREMITY TREATMENT   Patient Name: Leslie Koch MRN: [id number] DOB:1955-06-23, 67 y.o., female Today's Date: 02/25/2022  END OF SESSION:  PT End of Session - 02/25/22 0908     Visit Number 2    Number of Visits 13    Date for PT Re-Evaluation 04/06/22    PT Start Time 0903    PT Stop Time 0953    PT Time Calculation (min) 50 min    Activity Tolerance Patient tolerated treatment well    Behavior During Therapy [facility] for tasks assessed/performed             Past Medical History:  Diagnosis Date   Arthritis    GERD (gastroesophageal reflux disease)    Hypertension    PONV (postoperative nausea and vomiting)    Past Surgical History:  Procedure Laterality Date   ABDOMINAL HYSTERECTOMY  01/1999   COLONOSCOPY WITH PROPOFOL N/A 06/22/2021   Procedure: COLONOSCOPY WITH PROPOFOL;  Surgeon: Lin Landsman, MD;  Location: [facility];  Service: Gastroenterology;  Laterality: N/A;   CYST EXCISION  1974   KNEE SURGERY Right 07/2010   TOTAL KNEE ARTHROPLASTY Right 10/12/2012   TOTAL KNEE ARTHROPLASTY WITH REVISION COMPONENTS Right 02/11/2016   Procedure: RIGHT TOTAL KNEE ARTHROPLASTY REVISION;  Surgeon: Gaynelle Arabian, MD;  Location: [facility];  Service: Orthopedics;  Laterality: Right;   TUBAL LIGATION  1984   Patient Active Problem List   Diagnosis Date Noted   Adenomatous polyp of ascending colon    Screening for colon cancer 05/05/2021   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 Wynelle Link)  L knee osteoarthritis  THERAPY DIAG:  Chronic pain of right knee  Chronic pain of left knee  Joint stiffness of both knees  Muscle weakness (generalized)  Gait difficulty  Rationale for Evaluation and Treatment: Rehabilitation  ONSET DATE: 2014  SUBJECTIVE:   SUBJECTIVE STATEMENT: Pt. Reports chronic R knee issues and had TKA in 2014 and 2 revisions.  Pt. Had 1st revision by Dr. Rudene Christians and 2nd revision by Dr. Wynelle Link.  Increase B knee pain with walking/ yardwork.  Pt. Reports pain ranges from 6-10/10 B knee pain.  Pt. Reports no pain at rest.  Pt. Hoping to have L TKA in 2 years because she takes care of grandson at this time.  Pt. Has h/o L knee injections but not for past 1.5 years.  Pt. Retired from work and lives with husband.  Pt. Cares for mother (next door neighbor) and grandson.  L carpal tunnel syndrome and wearing brace.  R thumb pain in joint.     PERTINENT HISTORY: See MD notes.    PAIN:  Are you having pain? Yes: NPRS scale: 0/10 Pain location: R knee/ L knee Pain description: sharp/ shooting/ stabbing/ burning Aggravating factors: Increase activity Relieving factors: Ice/ Celebrex  PRECAUTIONS: None  WEIGHT BEARING RESTRICTIONS: No  FALLS:  Has patient fallen in last 6 months? No  LIVING ENVIRONMENT: Lives with: lives with their family Lives in: House/apartment Stairs: Yes: External:  3-4 steps; on right going up Has following equipment at home: Single point cane, Walker - 2 wheeled, and Grab bars  OCCUPATION: Retired  PLOF: Independent  PATIENT GOALS: Improve B LE muscle strength/ decrease pain.    NEXT MD VISIT: 03/26/22  OBJECTIVE:   DIAGNOSTIC FINDINGS: See MD notes  PATIENT SURVEYS:  FOTO initial 43/ goal 15  COGNITION: Overall cognitive status: Within functional limits for tasks assessed     SENSATION: WFL  EDEMA:  Circumferential: L knee joint (49 cm.)/ R knee joint (49 cm.).  10 cm inf. To patella for L mid-gastroc  (42 cm.), R mid-gastroc (44 cm.).   MUSCLE LENGTH: Hamstrings: Right 86 deg; Left 86 deg Thomas test: TBD POSTURE: No Significant postural limitations, rounded shoulders, and forward head  PALPATION: No joint line tenderness.    LOWER EXTREMITY ROM:  Active ROM Right eval Left eval  Hip flexion 100 deg 102 deg.   Hip extension    Hip abduction [facility]  Hip adduction    Hip internal rotation    Hip external rotation    Knee flexion 95 deg. 94 deg.  Knee extension 0 deg.  -10 deg.  Ankle dorsiflexion    Ankle plantarflexion    Ankle inversion    Ankle eversion     (Blank rows = not tested)  LOWER EXTREMITY MMT:  MMT Right eval Left eval  Hip flexion 4-/5 4-/5  Hip extension    Hip abduction 4+/5 4/5  Hip adduction 4/5 4/5  Hip internal rotation 4/5 4/5  Hip external rotation 4/5 4/5  Knee flexion 5/5 (cramp) 5/5  Knee extension 4-/5 4/5  Ankle dorsiflexion    Ankle plantarflexion    Ankle inversion    Ankle eversion     (Blank rows = not tested)  FUNCTIONAL TESTS:  TBD  GAIT: Distance walked: 60 ft. Assistive device utilized: None Level of assistance: Complete Independence Comments: Pt. Walked approx. 60 ft. In hallway and was cued to demonstrate her typical gait. Pt. Showed L antalgic gait showing that she favors her R leg over her left which is currently leading to compensation issues in her R knee. Pt. Has decreased stance time/step-length on her L LE compared to her R LE.  Limited to no arm swing until cued by PT.     TODAY'S TREATMENT:                                                                                                                              DATE: 02/25/2022    Subjective: Pt. States she had increased pain after going to the grocery store due to all the walking. Pt. Used ice to relieve pain and the sx. Took about an hour to subside. Pt. States trouble with walking distances, and getting up from the toilet due to both pain and weakness.    There Ex.: - 5x sit to stand Test(13.44 sec.) Followed by 2x10 reps from standard  clinic chair. Pt. Cued to avoid using hands to rise from chair to focus on LE strengthening.   -Seated LAQ with 4 lb. Ankle wt.'s. 2x10 reps each leg.  -Standing hip flex./abduction with 4 lb. ankle wt's. Pt. Cued to hold knee flexion for 3 sec. To focus on controlled hip contraction.   Manual therapy: -B supine stretching hamstrings/hip flexors. 2x30 sec. holds  -Distraction of L knee joint 2x30 sec. Bouts.  PATIENT EDUCATION:  Education details: Pt. Educated on issued HEP via demonstration, verbal cues, and handouts. Pt. Educated on her strength and mobility deficits found during PT intial EVAL. Pt. Educated on frequency and POC moving forward.    Person educated: Patient Education method: Explanation, Demonstration, and Handouts Education comprehension: verbalized understanding and returned demonstration  HOME EXERCISE PROGRAM: Access Code: [email or web address] URL: [email or web address] Date: 02/23/2022 Prepared by: Dorcas Carrow  Exercises - Supine Single Knee to Chest Stretch  - 1 x daily - 7 x weekly - 3 sets - 10 reps - Supine March  - 1 x daily - 5 x weekly - 3 sets - 10 reps - Mini Squat with Counter Support  - 1 x daily - 5 x weekly - 3 sets - 10 reps - Standing Hip Abduction with Counter Support  - 1 x daily - 7 x weekly - 3 sets - 10 reps  ASSESSMENT:  CLINICAL IMPRESSION: Patient arrived to PT with no complaints of knee pain. Pt. Had slight increase of 1/10 pain on NPS. During NuStep activity and sit to stand activities. PT performed 5x sit to stand test on pt. And recorded a time of 13.44 sec. Creating a good baseline for improvement. Pt. Was able to complete sit to stand test without any UE use. Pt. Required cuing to maintain a stable pelvis and avoid swaying of her trunk during hip abd./flex. exercises to effectively target hip muscles. Pt. Showed decreased endurance due to  deconditioning from recent illness and pain level. Pt. Did not have any instances of LOB during treatment. Pt. Demonstrated tightness in her hamstrings that improved with manual therapy. Pt. Demonstrated significant LE weakness during there ex. Pt. Will benefit from skilled PT services to increase B knee ROM/ LE stability to improve pain-free mobility/ gait.  Pt. Reports occasional night pain if she's not in the right position.   - 5x sit to stand (2/15) - 13.44 sec.   OBJECTIVE IMPAIRMENTS: Abnormal gait, decreased activity tolerance, decreased endurance, decreased mobility, difficulty walking, decreased ROM, decreased strength, hypomobility, and pain.   ACTIVITY LIMITATIONS: carrying, lifting, bending, squatting, sleeping, and stairs  PARTICIPATION LIMITATIONS: cleaning and yard work  PERSONAL FACTORS: Age, Past/current experiences, and 1-2 comorbidities: Previous TKA w/ revisions, COVID asthma  are also affecting patient's functional outcome.   REHAB POTENTIAL: Good  CLINICAL DECISION MAKING: Stable/uncomplicated  EVALUATION COMPLEXITY: Low  Tests: -5x sit to stand   GOALS: Goals reviewed with patient? Yes  SHORT TERM GOALS: Target date: 03/16/2022 Pt. Will be independent with HEP to improve strength and decrease B knee pain to improve overall pain-free function.  Baseline: Just issued.  Goal status: INITIAL  LONG TERM GOALS: Target date: 04/06/2022  Pt. Will increase FOTO to at least 55 to demonstrate increased function/endurance of B knees and increase overall activity level.  Baseline: 43 Goal status: INITIAL  2.  Pt. will be able to demonstrate a normalized gait pattern with low complaints of pain(<2/10 NPS) to avoid compensation of the R LE/Low back .  Baseline: See gait section.  Goal status: INITIAL  3.  Pt. Will increase LE strength a half muscle grade to improve B LE function and decrease load on joint to decrease B knee joint pain.  Baseline: 4-/5 Goal status:  INITIAL   PLAN:  PT FREQUENCY: 2x/week  PT DURATION: 6 weeks  PLANNED INTERVENTIONS: Therapeutic exercises, Therapeutic activity, Neuromuscular re-education, Balance training, Gait training, Patient/Family education, Self Care, Joint mobilization, Joint manipulation, Stair training, Dry Needling, Cryotherapy, Moist heat, scar mobilization, Traction, and Manual therapy  PLAN FOR NEXT SESSION: Begin LE strengthening/endurance program. Assess/Update HEP.   Lorine Iannaccone B. Rogers Blocker, SPT Pura Spice, PT, DPT # [phone] 02/25/2022, 11:52 AM

## 2022-03-02 ENCOUNTER — Ambulatory Visit: Payer: Medicare HMO

## 2022-03-02 DIAGNOSIS — G8929 Other chronic pain: Secondary | ICD-10-CM | POA: Diagnosis not present

## 2022-03-02 DIAGNOSIS — M25562 Pain in left knee: Secondary | ICD-10-CM | POA: Diagnosis not present

## 2022-03-02 DIAGNOSIS — M6281 Muscle weakness (generalized): Secondary | ICD-10-CM

## 2022-03-02 DIAGNOSIS — R269 Unspecified abnormalities of gait and mobility: Secondary | ICD-10-CM | POA: Diagnosis not present

## 2022-03-02 DIAGNOSIS — M25661 Stiffness of right knee, not elsewhere classified: Secondary | ICD-10-CM

## 2022-03-02 DIAGNOSIS — M25662 Stiffness of left knee, not elsewhere classified: Secondary | ICD-10-CM | POA: Diagnosis not present

## 2022-03-02 DIAGNOSIS — M25561 Pain in right knee: Secondary | ICD-10-CM | POA: Diagnosis not present

## 2022-03-02 NOTE — Therapy (Unsigned)
OUTPATIENT PHYSICAL THERAPY LOWER EXTREMITY TREATMENT   Patient Name: Leslie Koch MRN: [id number] DOB:[birth year], 67 y.o., female Today's Date: 03/02/2022  END OF SESSION:  PT End of Session - 03/02/22 0950     Visit Number 3    Number of Visits 13    Date for PT Re-Evaluation 04/06/22    PT Start Time 0950    PT Stop Time 1034    PT Time Calculation (min) 44 min    Activity Tolerance Patient tolerated treatment well    Behavior During Therapy [facility] for tasks assessed/performed             Past Medical History:  Diagnosis Date   Arthritis    GERD (gastroesophageal reflux disease)    Hypertension    PONV (postoperative nausea and vomiting)    Past Surgical History:  Procedure Laterality Date   ABDOMINAL HYSTERECTOMY  01/1999   COLONOSCOPY WITH PROPOFOL N/A 06/22/2021   Procedure: COLONOSCOPY WITH PROPOFOL;  Surgeon: Lin Landsman, MD;  Location: [facility] ENDOSCOPY;  Service: Gastroenterology;  Laterality: N/A;   CYST EXCISION  1974   KNEE SURGERY Right 07/2010   TOTAL KNEE ARTHROPLASTY Right 10/12/2012   TOTAL KNEE ARTHROPLASTY WITH REVISION COMPONENTS Right 02/11/2016   Procedure: RIGHT TOTAL KNEE ARTHROPLASTY REVISION;  Surgeon: Gaynelle Arabian, MD;  Location: [facility];  Service: Orthopedics;  Laterality: Right;   TUBAL LIGATION  1984   Patient Active Problem List   Diagnosis Date Noted   Adenomatous polyp of ascending colon    Screening for colon cancer 05/05/2021   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 Wynelle Link)  L knee osteoarthritis  THERAPY DIAG:  Chronic pain of right knee  Chronic pain of left knee  Joint stiffness of both knees  Muscle weakness (generalized)  Gait difficulty  Rationale for Evaluation and Treatment: Rehabilitation  ONSET DATE: 2014  SUBJECTIVE:   SUBJECTIVE STATEMENT: Pt. Reports chronic R knee issues and had TKA in 2014 and 2 revisions.  Pt. Had 1st revision by Dr. Rudene Christians and 2nd revision by Dr. Wynelle Link.  Increase B knee pain with walking/ yardwork.  Pt. Reports pain ranges from 6-10/10 B knee pain.  Pt. Reports no pain at rest.  Pt. Hoping to have L TKA in 2 years because she takes care of grandson at this time.  Pt. Has h/o L knee injections but not for past 1.5 years.  Pt. Retired from work and lives with husband.  Pt. Cares for mother (next door neighbor) and grandson.  L carpal tunnel syndrome and wearing brace.  R thumb pain in joint.     PERTINENT HISTORY: See MD notes.    PAIN:  Are you having pain? Yes: NPRS scale: 0/10 Pain location: R knee/ L knee Pain description: sharp/ shooting/ stabbing/ burning Aggravating factors: Increase activity Relieving factors: Ice/ Celebrex  PRECAUTIONS: None  WEIGHT BEARING RESTRICTIONS: No  FALLS:  Has patient fallen in last 6 months? No  LIVING ENVIRONMENT: Lives with: lives with their family Lives in: House/apartment Stairs: Yes: External:  3-4 steps; on right going up Has following equipment at home: Single point cane, Walker - 2 wheeled, and Grab bars  OCCUPATION: Retired  PLOF: Independent  PATIENT GOALS: Improve B LE muscle strength/ decrease pain.    NEXT MD VISIT: 03/26/22  OBJECTIVE:   DIAGNOSTIC FINDINGS: See MD notes  PATIENT SURVEYS:  FOTO initial 43/ goal 29  COGNITION: Overall cognitive status: Within functional limits for tasks assessed     SENSATION: WFL  EDEMA:  Circumferential: L knee joint (49 cm.)/ R knee joint (49 cm.).  10 cm inf. To patella for L mid-gastroc  (42 cm.), R mid-gastroc (44 cm.).   MUSCLE LENGTH: Hamstrings: Right 86 deg; Left 86 deg Thomas test: TBD POSTURE: No Significant postural limitations, rounded shoulders, and forward head  PALPATION: No joint line tenderness.    LOWER EXTREMITY ROM:  Active ROM Right eval Left eval  Hip flexion 100 deg 102 deg.   Hip extension    Hip abduction [facility]  Hip adduction    Hip internal rotation    Hip external rotation    Knee flexion 95 deg. 94 deg.  Knee extension 0 deg.  -10 deg.  Ankle dorsiflexion    Ankle plantarflexion    Ankle inversion    Ankle eversion     (Blank rows = not tested)  LOWER EXTREMITY MMT:  MMT Right eval Left eval  Hip flexion 4-/5 4-/5  Hip extension    Hip abduction 4+/5 4/5  Hip adduction 4/5 4/5  Hip internal rotation 4/5 4/5  Hip external rotation 4/5 4/5  Knee flexion 5/5 (cramp) 5/5  Knee extension 4-/5 4/5  Ankle dorsiflexion    Ankle plantarflexion    Ankle inversion    Ankle eversion     (Blank rows = not tested)  FUNCTIONAL TESTS:  - 5x sit to stand (2/15) - 13.44 sec.   GAIT: Distance walked: 60 ft. Assistive device utilized: None Level of assistance: Complete Independence Comments: Pt. Walked approx. 60 ft. In hallway and Koch cued to demonstrate her typical gait. Pt. Showed L antalgic gait showing that she favors her R leg over her left which is currently leading to compensation issues in her R knee. Pt. Has decreased stance time/step-length on her L LE compared to her R LE.  Limited to no arm swing until cued by PT.     TODAY'S TREATMENT:                                                                                                                              DATE: 03/02/2022    Subjective: Pt. Arrived to PT with 0/10 B knee pain on NPS. Pt. States she feels tightness and 2/10 R knee pain during her HEP. Pt. States a dull ache on the outside of the R knee with NuStep there ex.   There Ex.: -NuStep 10 min. L5. Seat 7.  UE/LE. O2 Sat: 98%   - Education/demonstration  of independent standing calf stretch. Pt. Demonstrated back to PT for 1x60 sec. Each side.   - Education/demonstration of independent seated hamstring  stretch. Pt. Demonstrated back to PT for 1x60 sec. Each side.   -Supine hip flexor stretch with one LE hanging off of treatment table. Pt. Cued to contract TrA. 1x30 sec. Each side.   Manual therapy: -R knee patella Mobes: sup. / inf. / lat. / med. Asessment. Pt. Educated on at home patellar mobes. Pt. Returned demonstration 1x30 sec. Each direction.  -Seated long axis distraction of R knee joint 1x30 sec. Bouts.  -Updated HEP.  PATIENT EDUCATION:  Education details: Pt. Educated on issued HEP via demonstration, verbal cues, and handouts. Pt. Educated on her strength and mobility deficits found during PT intial EVAL. Pt. Educated on frequency and POC moving forward.    Person educated: Patient Education method: Explanation, Demonstration, and Handouts Education comprehension: verbalized understanding and returned demonstration  HOME EXERCISE PROGRAM: Access Code: [email or web address] URL: [email or web address] Date: 02/23/2022 Prepared by: Dorcas Carrow  Exercises - Supine Single Knee to Chest Stretch  - 1 x daily - 7 x weekly - 3 sets - 10 reps - Supine March  - 1 x daily - 5 x weekly - 3 sets - 10 reps - Mini Squat with Counter Support  - 1 x daily - 5 x weekly - 3 sets - 10 reps - Standing Hip Abduction with Counter Support  - 1 x daily - 7 x weekly - 3 sets - 10 reps  ASSESSMENT:  CLINICAL IMPRESSION: Patient arrived to PT with no complaints of knee pain at rest. Pt. Had no increase in pain during today's treatment. Pt. Showed decreased endurance during NuStep there ex. due to deconditioning from recent illness and pain level. Pt. Monitored O2 Sat. Levels. Pt. Vitals maintained WNL. PT educated on the importance of stretching to maintain the optimal pull of the muscle on the knee  joint to decrease pain and get more optimal transmission of force throughout the LE. PT. Demonstrated stretching exercises to pt. Pt. then returned the demonstrated back with no concerns. PT provided an updated HEP to address B LE muscle tightness. PT assessed patellar movement in R knee as hypomobile that improved with continued mobilization. PT performed long axis distraction to the R knee to increase joint space and decrease pain. Pt. Will benefit from skilled PT services to increase B knee ROM/ LE stability to improve pain-free mobility/ gait.   OBJECTIVE IMPAIRMENTS: Abnormal gait, decreased activity tolerance, decreased endurance, decreased mobility, difficulty walking, decreased ROM, decreased strength, hypomobility, and pain.   ACTIVITY LIMITATIONS: carrying, lifting, bending, squatting, sleeping, and stairs  PARTICIPATION LIMITATIONS: cleaning and yard work  PERSONAL FACTORS: Age, Past/current experiences, and 1-2 comorbidities: Previous TKA w/ revisions, COVID asthma  are also affecting patient's functional outcome.   REHAB POTENTIAL: Good  CLINICAL DECISION MAKING: Stable/uncomplicated  EVALUATION COMPLEXITY: Low  Tests: -5x sit to stand   GOALS: Goals reviewed with patient? Yes  SHORT TERM GOALS: Target date: 03/16/2022 Pt. Will be independent with HEP to improve strength and decrease B knee pain to improve overall pain-free function.  Baseline: Just issued.  Goal status: INITIAL  LONG TERM GOALS: Target date: 04/06/2022  Pt. Will increase FOTO to at least 55 to demonstrate increased function/endurance of B knees and increase overall activity level.  Baseline: 43 Goal status: INITIAL  2.  Pt. will be able to demonstrate a normalized gait pattern with low complaints of pain(<2/10 NPS) to avoid compensation  of the R LE/Low back .  Baseline: See gait section.  Goal status: INITIAL  3.  Pt. Will increase LE strength a half muscle grade to improve B LE function and  decrease load on joint to decrease B knee joint pain.  Baseline: 4-/5 Goal status: INITIAL   PLAN:  PT FREQUENCY: 2x/week  PT DURATION: 6 weeks  PLANNED INTERVENTIONS: Therapeutic exercises, Therapeutic activity, Neuromuscular re-education, Balance training, Gait training, Patient/Family education, Self Care, Joint mobilization, Joint manipulation, Stair training, Dry Needling, Cryotherapy, Moist heat, scar mobilization, Traction, and Manual therapy  PLAN FOR NEXT SESSION: Begin LE strengthening/endurance program. Assess/Update HEP.   Leslie Koch, SPT Pura Spice, PT, DPT # [phone] 03/02/2022, 1:01 PM

## 2022-03-02 NOTE — Therapy (Signed)
OUTPATIENT PHYSICAL THERAPY LOWER EXTREMITY TREATMENT   Patient Name: Leslie Koch MRN: [id number] DOB:08/05/1955, 67 y.o., female Today's Date: 02/25/2022  END OF SESSION:  PT End of Session - 02/25/22 0908     Visit Number 2    Number of Visits 13    Date for PT Re-Evaluation 04/06/22    PT Start Time 0903    PT Stop Time 0953    PT Time Calculation (min) 50 min    Activity Tolerance Patient tolerated treatment well    Behavior During Therapy [facility] for tasks assessed/performed             Past Medical History:  Diagnosis Date   Arthritis    GERD (gastroesophageal reflux disease)    Hypertension    PONV (postoperative nausea and vomiting)    Past Surgical History:  Procedure Laterality Date   ABDOMINAL HYSTERECTOMY  01/1999   COLONOSCOPY WITH PROPOFOL N/A 06/22/2021   Procedure: COLONOSCOPY WITH PROPOFOL;  Surgeon: Toney Reil, MD;  Location: [facility] END[facility];  Service: Gastroenterology;  Laterality: N/A;   CYST EXCISION  1974   KNEE SURGERY Right 07/2010   TOTAL KNEE ARTHROPLASTY Right 10/12/2012   TOTAL KNEE ARTHROPLASTY WITH REVISION COMPONENTS Right 02/11/2016   Procedure: RIGHT TOTAL KNEE ARTHROPLASTY REVISION;  Surgeon: Ollen Gross, MD;  Location: [facility];  Service: Orthopedics;  Laterality: Right;   TUBAL LIGATION  1984   Patient Active Problem List   Diagnosis Date Noted   Adenomatous polyp of ascending colon    Screening for colon cancer 05/05/2021   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 Lequita Halt)  L knee osteoarthritis  THERAPY DIAG:  Chronic pain of right knee  Chronic pain of left knee  Joint stiffness of both knees  Muscle weakness (generalized)  Gait difficulty  Rationale for Evaluation and Treatment: Rehabilitation  ONSET DATE: 2014  SUBJECTIVE:   SUBJECTIVE STATEMENT: Pt. Reports chronic R knee issues and had TKA in 2014 and 2 revisions.  Pt. Had 1st revision by Dr. Rosita Kea and 2nd revision by Dr. Lequita Halt.  Increase B knee pain with walking/ yardwork.  Pt. Reports pain ranges from 6-10/10 B knee pain.  Pt. Reports no pain at rest.  Pt. Hoping to have L TKA in 2 years because she takes care of grandson at this time.  Pt. Has h/o L knee injections but not for past 1.5 years.  Pt. Retired from work and lives with husband.  Pt. Cares for mother (next door neighbor) and grandson.  L carpal tunnel syndrome and wearing brace.  R thumb pain in joint.     PERTINENT HISTORY:  See MD notes.    PAIN:  Are you having pain? Yes: NPRS scale: 0/10 Pain location: R knee/ L knee Pain description: sharp/ shooting/ stabbing/ burning Aggravating factors: Increase activity Relieving factors: Ice/ Celebrex  PRECAUTIONS: None  WEIGHT BEARING RESTRICTIONS: No  FALLS:  Has patient fallen in last 6 months? No  LIVING ENVIRONMENT: Lives with: lives with their family Lives in: House/apartment Stairs: Yes:  External: 3-4 steps; on right going up Has following equipment at home: Single point cane, Walker - 2 wheeled, and Grab bars  OCCUPATION: Retired  PLOF: Independent  PATIENT GOALS: Improve B LE muscle strength/ decrease pain.    NEXT MD VISIT: 03/26/22  OBJECTIVE:   DIAGNOSTIC FINDINGS: See MD notes  PATIENT SURVEYS:  FOTO initial 43/ goal 23  COGNITION: Overall cognitive status: Within functional limits for tasks assessed     SENSATION: WFL  EDEMA:  Circumferential: L knee joint (49 cm.)/ R knee joint (49 cm.).  10 cm inf. To patella for L  mid-gastroc (42 cm.), R mid-gastroc (44 cm.).   MUSCLE LENGTH: Hamstrings: Right 86 deg; Left 86 deg Thomas test: TBD POSTURE: No Significant postural limitations, rounded shoulders, and forward head  PALPATION: No joint line tenderness.    LOWER EXTREMITY ROM:  Active ROM Right eval Left eval  Hip flexion 100 deg 102 deg.   Hip extension    Hip abduction [facility]  Hip adduction    Hip internal rotation    Hip external rotation    Knee flexion 95 deg. 94 deg.  Knee extension 0 deg.  -10 deg.  Ankle dorsiflexion    Ankle plantarflexion    Ankle inversion    Ankle eversion     (Blank rows = not tested)  LOWER EXTREMITY MMT:  MMT Right eval Left eval  Hip flexion 4-/5 4-/5  Hip extension    Hip abduction 4+/5 4/5  Hip adduction 4/5 4/5  Hip internal rotation 4/5 4/5  Hip external rotation 4/5 4/5  Knee flexion 5/5 (cramp) 5/5  Knee extension 4-/5 4/5  Ankle dorsiflexion    Ankle plantarflexion    Ankle inversion    Ankle eversion     (Blank rows = not tested)  FUNCTIONAL TESTS:  TBD  GAIT: Distance walked: 60 ft. Assistive device utilized: None Level of assistance: Complete Independence Comments: Pt. Walked approx. 60 ft. In hallway and was cued to demonstrate her typical gait. Pt. Showed L antalgic gait showing that she favors her R leg over her left which is currently leading to compensation issues in her R knee. Pt. Has decreased stance time/step-length on her L LE compared to her R LE.  Limited to no arm swing until cued by PT.     TODAY'S TREATMENT:                                                                                                                              DATE: 02/25/2022    Subjective: Pt. States she had increased pain in the medial aspect of the R knee after going to the grocery store due to all the walking. Pt. Used ice to relieve pain and the sx. Took about an hour to subside. Pt. States trouble with walking distances, and getting up  from the toilet due to both pain and weakness.   There Ex.: - 5x sit to  stand Test(13.44 sec.) Followed by 2x10 reps from standard clinic chair. Pt. Cued to avoid using hands to rise from chair to focus on LE strengthening.   -Seated LAQ with 4 lb. Ankle wt.'s. 2x10 reps each leg.  -Standing hip flex./abduction with 4 lb. ankle wt's. Pt. Cued to hold knee flexion for 3 sec. To focus on controlled hip contraction.   Manual therapy: -B supine stretching hamstrings/hip flexors. 2x30 sec. holds  -Distraction of L knee joint 2x30 sec. Bouts.  PATIENT EDUCATION:  Education details: Pt. Educated on issued HEP via demonstration, verbal cues, and handouts. Pt. Educated on her strength and mobility deficits found during PT intial EVAL. Pt. Educated on frequency and POC moving forward.    Person educated: Patient Education method: Explanation, Demonstration, and Handouts Education comprehension: verbalized understanding and returned demonstration  HOME EXERCISE PROGRAM: Access Code: [email or web address] URL: [email or web address] Date: 02/23/2022 Prepared by: Dorene Grebe  Exercises - Supine Single Knee to Chest Stretch  - 1 x daily - 7 x weekly - 3 sets - 10 reps - Supine March  - 1 x daily - 5 x weekly - 3 sets - 10 reps - Mini Squat with Counter Support  - 1 x daily - 5 x weekly - 3 sets - 10 reps - Standing Hip Abduction with Counter Support  - 1 x daily - 7 x weekly - 3 sets - 10 reps  ASSESSMENT:  CLINICAL IMPRESSION: Patient arrived to PT with no complaints of knee pain. Pt. Had slight increase of 1/10 pain on NPS. During NuStep activity and sit to stand activities. PT performed 5x sit to stand test on pt. And recorded a time of 13.44 sec. Creating a good baseline for improvement. Pt. Was able to complete sit to stand test without any UE use. Pt. Required cuing to maintain a stable pelvis and avoid swaying of her trunk during hip abd./flex. exercises to effectively target hip  muscles. Pt. Showed decreased endurance due to deconditioning from recent illness and pain level. Pt. Did not have any instances of LOB during treatment. Pt. Demonstrated tightness in her hamstrings that improved with manual therapy. Pt. Demonstrated significant LE weakness during there ex. Pt. Will benefit from skilled PT services to increase B knee ROM/ LE stability to improve pain-free mobility/ gait.  Pt. Reports occasional night pain if she's not in the right position.   - 5x sit to stand (2/15) - 13.44 sec.   OBJECTIVE IMPAIRMENTS: Abnormal gait, decreased activity tolerance, decreased endurance, decreased mobility, difficulty walking, decreased ROM, decreased strength, hypomobility, and pain.   ACTIVITY LIMITATIONS: carrying, lifting, bending, squatting, sleeping, and stairs  PARTICIPATION LIMITATIONS: cleaning and yard work  PERSONAL FACTORS: Age, Past/current experiences, and 1-2 comorbidities: Previous TKA w/ revisions, COVID asthma  are also affecting patient's functional outcome.   REHAB POTENTIAL: Good  CLINICAL DECISION MAKING: Stable/uncomplicated  EVALUATION COMPLEXITY: Low  Tests: -5x sit to stand   GOALS: Goals reviewed with patient? Yes  SHORT TERM GOALS: Target date: 03/16/2022 Pt. Will be independent with HEP to improve strength and decrease B knee pain to improve overall pain-free function.  Baseline: Just issued.  Goal status: INITIAL  LONG TERM GOALS: Target date: 04/06/2022  Pt. Will increase FOTO to at least 55 to demonstrate increased function/endurance of B knees and increase overall activity level.  Baseline: 43 Goal status: INITIAL  2.  Pt. will be able to demonstrate a normalized gait pattern with low complaints of pain(<2/10 NPS) to avoid compensation of the R  LE/Low back .  Baseline: See gait section.  Goal status: INITIAL  3.  Pt. Will increase LE strength a half muscle grade to improve B LE function and decrease load on joint to decrease B  knee joint pain.  Baseline: 4-/5 Goal status: INITIAL   PLAN:  PT FREQUENCY: 2x/week  PT DURATION: 6 weeks  PLANNED INTERVENTIONS: Therapeutic exercises, Therapeutic activity, Neuromuscular re-education, Balance training, Gait training, Patient/Family education, Self Care, Joint mobilization, Joint manipulation, Stair training, Dry Needling, Cryotherapy, Moist heat, scar mobilization, Traction, and Manual therapy  PLAN FOR NEXT SESSION: Begin LE strengthening/endurance program. Assess/Update HEP.   Ashlyn B. Artis Flock, SPT Cammie Mcgee, PT, DPT # [phone] 02/25/2022, 11:52 AM

## 2022-03-04 ENCOUNTER — Ambulatory Visit: Payer: Medicare HMO

## 2022-03-04 DIAGNOSIS — R269 Unspecified abnormalities of gait and mobility: Secondary | ICD-10-CM

## 2022-03-04 DIAGNOSIS — G8929 Other chronic pain: Secondary | ICD-10-CM | POA: Diagnosis not present

## 2022-03-04 DIAGNOSIS — M6281 Muscle weakness (generalized): Secondary | ICD-10-CM | POA: Diagnosis not present

## 2022-03-04 DIAGNOSIS — M25662 Stiffness of left knee, not elsewhere classified: Secondary | ICD-10-CM | POA: Diagnosis not present

## 2022-03-04 DIAGNOSIS — M25661 Stiffness of right knee, not elsewhere classified: Secondary | ICD-10-CM | POA: Diagnosis not present

## 2022-03-04 DIAGNOSIS — M25562 Pain in left knee: Secondary | ICD-10-CM | POA: Diagnosis not present

## 2022-03-04 DIAGNOSIS — M25561 Pain in right knee: Secondary | ICD-10-CM | POA: Diagnosis not present

## 2022-03-04 NOTE — Therapy (Signed)
OUTPATIENT PHYSICAL THERAPY LOWER EXTREMITY TREATMENT   Patient Name: Leslie Koch MRN: [id number] DOB:[birth year], 67 y.o., female Today's Date: 03/04/2022  END OF SESSION:  PT End of Session - 03/04/22 1349     PT Start Time 0945    PT Stop Time 1029    PT Time Calculation (min) 44 min    Activity Tolerance Patient tolerated treatment well             Past Medical History:  Diagnosis Date   Arthritis    GERD (gastroesophageal reflux disease)    Hypertension    PONV (postoperative nausea and vomiting)    Past Surgical History:  Procedure Laterality Date   ABDOMINAL HYSTERECTOMY  01/1999   COLONOSCOPY WITH PROPOFOL N/A 06/22/2021   Procedure: COLONOSCOPY WITH PROPOFOL;  Surgeon: Lin Landsman, MD;  Location: [facility];  Service: Gastroenterology;  Laterality: N/A;   CYST EXCISION  1974   KNEE SURGERY Right 07/2010   TOTAL KNEE ARTHROPLASTY Right 10/12/2012   TOTAL KNEE ARTHROPLASTY WITH REVISION COMPONENTS Right 02/11/2016   Procedure: RIGHT TOTAL KNEE ARTHROPLASTY REVISION;  Surgeon: Gaynelle Arabian, MD;  Location: [facility];  Service: Orthopedics;  Laterality: Right;   TUBAL LIGATION  1984   Patient Active Problem List   Diagnosis Date Noted   Adenomatous polyp of ascending colon    Screening for colon cancer 05/05/2021   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 Wynelle Link)  L knee osteoarthritis  THERAPY DIAG:  Chronic pain of right knee  Chronic pain of left knee  Joint stiffness of both  knees  Muscle weakness (generalized)  Gait difficulty  Rationale for Evaluation and Treatment: Rehabilitation  ONSET DATE: 2014  SUBJECTIVE:   SUBJECTIVE STATEMENT: Pt. Reports chronic R knee issues and had TKA in 2014 and 2 revisions.  Pt. Had 1st revision by Dr. Rudene Christians and 2nd revision by Dr. Wynelle Link.  Increase B knee pain with walking/ yardwork.  Pt. Reports pain ranges from 6-10/10 B knee pain.  Pt. Reports no pain at rest.  Pt. Hoping to have L TKA in 2 years because she takes care of grandson at this time.  Pt. Has h/o L knee injections but not for past 1.5 years.  Pt. Retired from work and lives with husband.  Pt. Cares for mother (next door neighbor) and grandson.  L carpal tunnel syndrome and wearing brace.  R thumb pain in joint.     PERTINENT HISTORY: See MD notes.    PAIN:  Are you having pain? Yes: NPRS scale: 0/10 Pain location: R knee/ L knee Pain description: sharp/ shooting/ stabbing/ burning Aggravating factors: Increase activity Relieving factors: Ice/ Celebrex  PRECAUTIONS: None  WEIGHT BEARING RESTRICTIONS: No  FALLS:  Has patient fallen in last 6 months? No  LIVING ENVIRONMENT: Lives with: lives with their family Lives in: House/apartment Stairs: Yes: External: 3-4 steps; on right going up Has following equipment at home: Single point cane, Walker - 2 wheeled, and Grab bars  OCCUPATION: Retired  PLOF: Independent  PATIENT GOALS: Improve  B LE muscle strength/ decrease pain.    NEXT MD VISIT: 03/26/22  OBJECTIVE:   DIAGNOSTIC FINDINGS: See MD notes  PATIENT SURVEYS:  FOTO initial 43/ goal 42  COGNITION: Overall cognitive status: Within functional limits for tasks assessed     SENSATION: WFL  EDEMA:  Circumferential: L knee joint (49 cm.)/ R knee joint (49 cm.).  10 cm inf. To patella for L mid-gastroc (42 cm.), R mid-gastroc (44 cm.).   MUSCLE LENGTH: Hamstrings: Right 86 deg; Left 86 deg Thomas test: TBD POSTURE: No Significant  postural limitations, rounded shoulders, and forward head  PALPATION: No joint line tenderness.    LOWER EXTREMITY ROM:  Active ROM Right eval Left eval  Hip flexion 100 deg 102 deg.   Hip extension    Hip abduction [facility]  Hip adduction    Hip internal rotation    Hip external rotation    Knee flexion 95 deg. 94 deg.  Knee extension 0 deg.  -10 deg.  Ankle dorsiflexion    Ankle plantarflexion    Ankle inversion    Ankle eversion     (Blank rows = not tested)  LOWER EXTREMITY MMT:  MMT Right eval Left eval  Hip flexion 4-/5 4-/5  Hip extension    Hip abduction 4+/5 4/5  Hip adduction 4/5 4/5  Hip internal rotation 4/5 4/5  Hip external rotation 4/5 4/5  Knee flexion 5/5 (cramp) 5/5  Knee extension 4-/5 4/5  Ankle dorsiflexion    Ankle plantarflexion    Ankle inversion    Ankle eversion     (Blank rows = not tested)  FUNCTIONAL TESTS:  - 5x sit to stand (2/15) - 13.44 sec.   GAIT: Distance walked: 60 ft. Assistive device utilized: None Level of assistance: Complete Independence Comments: Pt. Walked approx. 60 ft. In hallway and was cued to demonstrate her typical gait. Pt. Showed L antalgic gait showing that she favors her R leg over her left which is currently leading to compensation issues in her R knee. Pt. Has decreased stance time/step-length on her L LE compared to her R LE.  Limited to no arm swing until cued by PT.     TODAY'S TREATMENT:                                                                                                                              DATE: 03/04/2022   Subjective: Pt. Arrived to PT with 0/10 B knee pain on NPS. Pt. States she feels tightness and 2/10 R knee pain during her HEP. Pt. States a dull ache on the outside of the R knee with NuStep there ex.   There Ex.: 44 min -NuStep 10 min. L5. Seat 7. UE/LE. O2 Sat: 98%  - Hamstring stretches 2 x 60 secs -Calf stretches 2 x 60 secs -Iliopsoas and Quad stretch in supine 1 x 60  secs -TKE #3 too fatigue -Bridges with ball squeeze 2 x  30 secs -STS without arms 2 x 30 secs -Standing Hip Abd and ext #3 1 x 10 reps  -Updated HEP .   Manual therapy: Not performed Today:. -R knee patella Mobes: sup. / inf. / lat. / med. Asessment. Pt. Educated on at home patellar mobes. Pt. Returned demonstration 1x30 sec. Each direction.  -Seated long axis distraction of R knee joint 1x30 sec. Bouts.    PATIENT EDUCATION:  Education details: Pt. Educated on issued HEP via demonstration, verbal cues, and handouts. Pt. Educated on her strength and mobility deficits found during PT intial EVAL. Pt. Educated on frequency and POC moving forward.    Person educated: Patient Education method: Explanation, Demonstration, and Handouts Education comprehension: verbalized understanding and returned demonstration  HOME EXERCISE PROGRAM: Access Code: [email or web address] URL: [email or web address] Date: 03/04/2022 Prepared by: Joaquin Music  Exercises - Gastroc Stretch on Wall  - 1 x daily - 7 x weekly - 2 sets - 60 hold - Seated Hamstring Stretch  - 1 x daily - 7 x weekly - 2 sets - 60 hold - Supine Quadriceps Stretch with Strap on Table  - 1 x daily - 7 x weekly - 2 sets - 60 hold - Standing Hip Abduction with Counter Support  - 1 x daily - 7 x weekly - 3 sets - 10 reps - Supine Hip Adduction Isometric with Ball  - 1 x daily - 7 x weekly - 3 sets - 10 reps  ASSESSMENT:  CLINICAL IMPRESSION: Patient arrived to PT with no complaints of knee pain and stated her medial aspect of the pain with standing and walking has be less to none since she began Patellar mobs. Pt introduced to BLE stretches, stability and strengthening exs to promote jt integrity and funciotnla mobility with decreased pain. Pt education through out the session regarding the significance of proper form, stretches and strengthening  to hep pt achieve her goals. HEP reviewed and updated and provided in H/O. Pt. Had no  increase in pain during today's treatment.  Pt. Will benefit from skilled PT services to increase B knee ROM/ LE stability to improve pain-free mobility/ gait.   OBJECTIVE IMPAIRMENTS: Abnormal gait, decreased activity tolerance, decreased endurance, decreased mobility, difficulty walking, decreased ROM, decreased strength, hypomobility, and pain.   ACTIVITY LIMITATIONS: carrying, lifting, bending, squatting, sleeping, and stairs  PARTICIPATION LIMITATIONS: cleaning and yard work  PERSONAL FACTORS: Age, Past/current experiences, and 1-2 comorbidities: Previous TKA w/ revisions, COVID asthma  are also affecting patient's functional outcome.   REHAB POTENTIAL: Good  CLINICAL DECISION MAKING: Stable/uncomplicated  EVALUATION COMPLEXITY: Low  Tests: -5x sit to stand   GOALS: Goals reviewed with patient? Yes  SHORT TERM GOALS: Target date: 03/16/2022 Pt. Will be independent with HEP to improve strength and decrease B knee pain to improve overall pain-free function.  Baseline: Just issued.  Goal status: INITIAL  LONG TERM GOALS: Target date: 04/06/2022  Pt. Will increase FOTO to at least 55 to demonstrate increased function/endurance of B knees and increase overall activity level.  Baseline: 43 Goal status: INITIAL  2.  Pt. will be able to demonstrate a normalized gait pattern with low complaints of pain(<2/10 NPS) to avoid compensation of the R LE/Low back .  Baseline: See gait section.  Goal status: INITIAL  3.  Pt. Will increase LE strength a half muscle grade to improve B LE function and decrease load on joint to decrease B knee joint pain.  Baseline: 4-/5 Goal status: INITIAL   PLAN:  PT  FREQUENCY: 2x/week  PT DURATION: 6 weeks  PLANNED INTERVENTIONS: Therapeutic exercises, Therapeutic activity, Neuromuscular re-education, Balance training, Gait training, Patient/Family education, Self Care, Joint mobilization, Joint manipulation, Stair training, Dry Needling,  Cryotherapy, Moist heat, scar mobilization, Traction, and Manual therapy  PLAN FOR NEXT SESSION: Begin LE strengthening/endurance program. Assess/Update HEP.    Joaquin Music PT DPT 1:59 PM,03/04/22

## 2022-03-09 ENCOUNTER — Ambulatory Visit: Payer: Medicare HMO | Admitting: Physical Therapy

## 2022-03-09 DIAGNOSIS — R269 Unspecified abnormalities of gait and mobility: Secondary | ICD-10-CM

## 2022-03-09 DIAGNOSIS — M25661 Stiffness of right knee, not elsewhere classified: Secondary | ICD-10-CM

## 2022-03-09 DIAGNOSIS — M6281 Muscle weakness (generalized): Secondary | ICD-10-CM

## 2022-03-09 DIAGNOSIS — G8929 Other chronic pain: Secondary | ICD-10-CM

## 2022-03-09 DIAGNOSIS — M25562 Pain in left knee: Secondary | ICD-10-CM | POA: Diagnosis not present

## 2022-03-09 DIAGNOSIS — M25662 Stiffness of left knee, not elsewhere classified: Secondary | ICD-10-CM | POA: Diagnosis not present

## 2022-03-09 DIAGNOSIS — M25561 Pain in right knee: Secondary | ICD-10-CM | POA: Diagnosis not present

## 2022-03-09 NOTE — Therapy (Signed)
OUTPATIENT PHYSICAL THERAPY LOWER EXTREMITY TREATMENT   Patient Name: Leslie Koch MRN: [id number] DOB:September 21, 1955, 67 y.o., female Today's Date: 03/09/2022  END OF SESSION:  PT End of Session - 03/09/22 0949     Visit Number 5    Number of Visits 13    Date for PT Re-Evaluation 04/06/22    PT Start Time 0950    PT Stop Time [id number]    PT Time Calculation (min) 44 min    Activity Tolerance Patient tolerated treatment well;No increased pain    Behavior During Therapy WFL for tasks assessed/performed             Past Medical History:  Diagnosis Date   Arthritis    GERD (gastroesophageal reflux disease)    Hypertension    PONV (postoperative nausea and vomiting)    Past Surgical History:  Procedure Laterality Date   ABDOMINAL HYSTERECTOMY  01/1999   COLONOSCOPY WITH PROPOFOL N/A 06/22/2021   Procedure: COLONOSCOPY WITH PROPOFOL;  Surgeon: Lin Landsman, MD;  Location: [facility];  Service: Gastroenterology;  Laterality: N/A;   CYST EXCISION  1974   KNEE SURGERY Right 07/2010   TOTAL KNEE ARTHROPLASTY Right 10/12/2012   TOTAL KNEE ARTHROPLASTY WITH REVISION COMPONENTS Right 02/11/2016   Procedure: RIGHT TOTAL KNEE ARTHROPLASTY REVISION;  Surgeon: Gaynelle Arabian, MD;  Location: [facility];  Service: Orthopedics;  Laterality: Right;   TUBAL LIGATION  1984   Patient Active Problem List   Diagnosis Date Noted   Adenomatous polyp of ascending colon    Screening for colon cancer 05/05/2021   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 Wynelle Link)  L knee osteoarthritis  THERAPY DIAG:  Chronic pain of right knee  Chronic pain of left knee  Joint stiffness of both knees  Muscle weakness (generalized)  Gait difficulty  Rationale for Evaluation and Treatment: Rehabilitation  ONSET DATE: 2014  SUBJECTIVE:   SUBJECTIVE STATEMENT: Pt. Reports chronic R knee issues and had TKA in 2014 and 2 revisions.  Pt. Had 1st revision by Dr. Rudene Christians and 2nd revision by Dr. Wynelle Link.  Increase B knee pain with walking/ yardwork.  Pt. Reports pain ranges from 6-10/10 B knee pain.  Pt. Reports no pain at rest.  Pt. Hoping to have L TKA in 2 years because she takes care of grandson at this time.  Pt. Has h/o L knee injections but not for past 1.5 years.  Pt. Retired from work and lives with husband.  Pt. Cares for mother (next door neighbor) and grandson.  L carpal tunnel syndrome and wearing brace.  R thumb pain in joint.     PERTINENT HISTORY: See MD notes.    PAIN:  Are you having pain? Yes: NPRS scale: 0/10 Pain location: R knee/ L knee Pain description: sharp/ shooting/ stabbing/ burning Aggravating factors: Increase activity Relieving factors: Ice/ Celebrex  PRECAUTIONS: None  WEIGHT BEARING RESTRICTIONS: No  FALLS:  Has patient fallen in last 6 months? No  LIVING ENVIRONMENT: Lives with: lives with their family Lives in: House/apartment Stairs:  Yes: External: 3-4 steps; on right going up Has following equipment at home: Single point cane, Walker - 2 wheeled, and Grab bars  OCCUPATION: Retired  PLOF: Independent  PATIENT GOALS: Improve B LE muscle strength/ decrease pain.    NEXT MD VISIT: 03/26/22  OBJECTIVE:   DIAGNOSTIC FINDINGS: See MD notes  PATIENT SURVEYS:  FOTO initial 43/ goal 14  COGNITION: Overall cognitive status: Within functional limits for tasks assessed     SENSATION: WFL  EDEMA:  Circumferential: L knee joint (49 cm.)/ R knee joint (49 cm.).  10 cm inf. To patella  for L mid-gastroc (42 cm.), R mid-gastroc (44 cm.).   MUSCLE LENGTH: Hamstrings: Right 86 deg; Left 86 deg Thomas test: TBD POSTURE: No Significant postural limitations, rounded shoulders, and forward head  PALPATION: No joint line tenderness.    LOWER EXTREMITY ROM:  Active ROM Right eval Left eval  Hip flexion 100 deg 102 deg.   Hip extension    Hip abduction [facility]  Hip adduction    Hip internal rotation    Hip external rotation    Knee flexion 95 deg. 94 deg.  Knee extension 0 deg.  -10 deg.  Ankle dorsiflexion    Ankle plantarflexion    Ankle inversion    Ankle eversion     (Blank rows = not tested)  LOWER EXTREMITY MMT:  MMT Right eval Left eval  Hip flexion 4-/5 4-/5  Hip extension    Hip abduction 4+/5 4/5  Hip adduction 4/5 4/5  Hip internal rotation 4/5 4/5  Hip external rotation 4/5 4/5  Knee flexion 5/5 (cramp) 5/5  Knee extension 4-/5 4/5  Ankle dorsiflexion    Ankle plantarflexion    Ankle inversion    Ankle eversion     (Blank rows = not tested)  FUNCTIONAL TESTS:  - 5x sit to stand (2/15) - 13.44 sec.   GAIT: Distance walked: 60 ft. Assistive device utilized: None Level of assistance: Complete Independence Comments: Pt. Walked approx. 60 ft. In hallway and was cued to demonstrate her typical gait. Pt. Showed L antalgic gait showing that she favors her R leg over her left which is currently leading to compensation issues in her R knee. Pt. Has decreased stance time/step-length on her L LE compared to her R LE.  Limited to no arm swing until cued by PT.     TODAY'S TREATMENT:                                                                                                                              DATE: 03/09/2022   Subjective: Pt. Arrived to PT with 0/10 B knee pain on NPS. Pt. States she had to use tylenol and ice over the weekend due to prolonged standing (5 hours) while cooking. Pt. Reported 8/10 pain on NPS during weekend activities.    There Ex.: 44 min -NuStep 10 min. L5. Seat 7. UE/LE. O2 Sat: 98%   -  Sit to stand on a slightly elevated surface. 2x10 reps. PT elevated surface slightly to get pt. In a pain-free range.   -Standing Hip abd./flex./ext. with 4 lb. Ankle wt.'s 3x10 reps each. UE support for balance.   Manual therapy:  -Supine hamstring stretches. 2x60 seconds each side. Calf OP applied.    PATIENT EDUCATION:  Education details: Pt. Educated on issued HEP via demonstration, verbal cues, and handouts. Pt. Educated on her strength and mobility deficits found during PT intial EVAL. Pt. Educated on frequency and POC moving forward.    Person educated: Patient Education method: Explanation, Demonstration, and Handouts Education comprehension: verbalized understanding and returned demonstration  HOME EXERCISE PROGRAM: Access Code: [email or web address] URL: [email or web address] Date: 03/04/2022 Prepared by: Joaquin Music  Exercises - Gastroc Stretch on Wall  - 1 x daily - 7 x weekly - 2 sets - 60 hold - Seated Hamstring Stretch  - 1 x daily - 7 x weekly - 2 sets - 60 hold - Supine Quadriceps Stretch with Strap on Table  - 1 x daily - 7 x weekly - 2 sets - 60 hold - Standing Hip Abduction with Counter Support  - 1 x daily - 7 x weekly - 3 sets - 10 reps - Supine Hip Adduction Isometric with Ball  - 1 x daily - 7 x weekly - 3 sets - 10 reps  ASSESSMENT:  CLINICAL IMPRESSION: Patient arrived to PT with no complaints of knee pain at rest. Pt. Had no increase in pain during there ex. Activity. PT modified sit to stands to an elevated level to keep pt. In a pain-free range to strengthen her LE's. Pt. required cuing during there ex. Activity to keep hips stable and isolate hip muscles. Pt. Requires minimal rest breaks and is demonstrating a steady increase in endurance. Pt. Educated on performing HEP in a pain-free range to progressively increase LE strength/ROM. Pt. Demonstrates tightness in her hamstrings that PT  addressed via supine stretching. PT educated pt. On benefits of elevation and ice after activity. Pt. Will benefit from skilled PT treatment to increase B knee strength/ROM to improve overall pain free function.   OBJECTIVE IMPAIRMENTS: Abnormal gait, decreased activity tolerance, decreased endurance, decreased mobility, difficulty walking, decreased ROM, decreased strength, hypomobility, and pain.   ACTIVITY LIMITATIONS: carrying, lifting, bending, squatting, sleeping, and stairs  PARTICIPATION LIMITATIONS: cleaning and yard work  PERSONAL FACTORS: Age, Past/current experiences, and 1-2 comorbidities: Previous TKA w/ revisions, COVID asthma  are also affecting patient's functional outcome.   REHAB POTENTIAL: Good  CLINICAL DECISION MAKING: Stable/uncomplicated  EVALUATION COMPLEXITY: Low  GOALS: Goals reviewed with patient? Yes  SHORT TERM GOALS: Target date: 03/16/2022 Pt. Will be independent with HEP to improve strength and decrease B knee pain to improve overall pain-free function.  Baseline: Just issued.  Goal status: INITIAL  LONG TERM GOALS: Target date: 04/06/2022  Pt. Will increase FOTO to at least 55 to demonstrate increased function/endurance of B knees and increase overall activity level.  Baseline: 43 Goal status: INITIAL  2.  Pt. will be able to demonstrate a normalized gait pattern with low complaints of pain(<2/10 NPS) to avoid compensation of the R LE/Low back .  Baseline: See gait section.  Goal status: INITIAL  3.  Pt. Will increase LE strength a half muscle grade to improve B LE function and decrease load on joint to decrease B knee joint pain.  Baseline: 4-/5 Goal status: INITIAL   PLAN:  PT FREQUENCY: 2x/week  PT DURATION: 6 weeks  PLANNED INTERVENTIONS: Therapeutic exercises, Therapeutic activity, Neuromuscular re-education, Balance training, Gait training, Patient/Family education, Self Care, Joint mobilization, Joint manipulation, Stair training,  Dry Needling, Cryotherapy, Moist heat, scar mobilization, Traction, and Manual therapy  PLAN FOR NEXT SESSION: Continue LE strengthening/endurance program. Assess/Update HEP.   Bettyanne Dittman B. Rogers Blocker, SPT Pura Spice, PT, DPT # [phone] 1:12 PM,03/09/22

## 2022-03-11 ENCOUNTER — Encounter: Payer: Self-pay | Admitting: Physical Therapy

## 2022-03-11 ENCOUNTER — Ambulatory Visit: Payer: Medicare HMO | Admitting: Physical Therapy

## 2022-03-11 DIAGNOSIS — G8929 Other chronic pain: Secondary | ICD-10-CM

## 2022-03-11 DIAGNOSIS — M6281 Muscle weakness (generalized): Secondary | ICD-10-CM | POA: Diagnosis not present

## 2022-03-11 DIAGNOSIS — M25561 Pain in right knee: Secondary | ICD-10-CM | POA: Diagnosis not present

## 2022-03-11 DIAGNOSIS — M25661 Stiffness of right knee, not elsewhere classified: Secondary | ICD-10-CM | POA: Diagnosis not present

## 2022-03-11 DIAGNOSIS — M25562 Pain in left knee: Secondary | ICD-10-CM | POA: Diagnosis not present

## 2022-03-11 DIAGNOSIS — M25662 Stiffness of left knee, not elsewhere classified: Secondary | ICD-10-CM | POA: Diagnosis not present

## 2022-03-11 DIAGNOSIS — R269 Unspecified abnormalities of gait and mobility: Secondary | ICD-10-CM

## 2022-03-11 NOTE — Therapy (Signed)
OUTPATIENT PHYSICAL THERAPY LOWER EXTREMITY TREATMENT   Patient Name: Leslie Koch MRN: [id number] DOB:Feb 01, 1955, 67 y.o., female Today's Date: 03/11/2022  END OF SESSION:  PT End of Session - 03/11/22 1034     Visit Number 6    Number of Visits 13    Date for PT Re-Evaluation 04/06/22    PT Start Time 1034    PT Stop Time 1122    PT Time Calculation (min) 48 min    Activity Tolerance Patient tolerated treatment well;No increased pain    Behavior During Therapy WFL for tasks assessed/performed             Past Medical History:  Diagnosis Date   Arthritis    GERD (gastroesophageal reflux disease)    Hypertension    PONV (postoperative nausea and vomiting)    Past Surgical History:  Procedure Laterality Date   ABDOMINAL HYSTERECTOMY  01/1999   COLONOSCOPY WITH PROPOFOL N/A 06/22/2021   Procedure: COLONOSCOPY WITH PROPOFOL;  Surgeon: Lin Landsman, MD;  Location: [facility];  Service: Gastroenterology;  Laterality: N/A;   CYST EXCISION  1974   KNEE SURGERY Right 07/2010   TOTAL KNEE ARTHROPLASTY Right 10/12/2012   TOTAL KNEE ARTHROPLASTY WITH REVISION COMPONENTS Right 02/11/2016   Procedure: RIGHT TOTAL KNEE ARTHROPLASTY REVISION;  Surgeon: Gaynelle Arabian, MD;  Location: [facility];  Service: Orthopedics;  Laterality: Right;   TUBAL LIGATION  1984   Patient Active Problem List   Diagnosis Date Noted   Adenomatous polyp of ascending colon    Screening for colon cancer 05/05/2021   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 Wynelle Link)  L knee osteoarthritis  THERAPY DIAG:  Chronic pain of right knee  Chronic pain of left knee  Joint stiffness of both knees  Muscle weakness (generalized)  Gait difficulty  Rationale for Evaluation and Treatment: Rehabilitation  ONSET DATE: 2014  SUBJECTIVE:   SUBJECTIVE STATEMENT: Pt. Reports chronic R knee issues and had TKA in 2014 and 2 revisions.  Pt. Had 1st revision by Dr. Rudene Christians and 2nd revision by Dr. Wynelle Link.  Increase B knee pain with walking/ yardwork.  Pt. Reports pain ranges from 6-10/10 B knee pain.  Pt. Reports no pain at rest.  Pt. Hoping to have L TKA in 2 years because she takes care of grandson at this time.  Pt. Has h/o L knee injections but not for past 1.5 years.  Pt. Retired from work and lives with husband.  Pt. Cares for mother (next door neighbor) and grandson.  L carpal tunnel syndrome and wearing brace.  R thumb pain in joint.     PERTINENT HISTORY: See MD notes.    PAIN:  Are you having pain? Yes: NPRS scale: 0/10 Pain location: R knee/ L knee Pain description: sharp/ shooting/ stabbing/ burning Aggravating factors: Increase activity Relieving factors: Ice/ Celebrex  PRECAUTIONS: None  WEIGHT BEARING RESTRICTIONS: No  FALLS:  Has patient fallen in last 6 months? No  LIVING ENVIRONMENT: Lives with: lives with their family Lives in: House/apartment Stairs:  Yes: External: 3-4 steps; on right going up Has following equipment at home: Single point cane, Walker - 2 wheeled, and Grab bars  OCCUPATION: Retired  PLOF: Independent  PATIENT GOALS: Improve B LE muscle strength/ decrease pain.    NEXT MD VISIT: 03/26/22  OBJECTIVE:   DIAGNOSTIC FINDINGS: See MD notes  PATIENT SURVEYS:  FOTO initial 43/ goal 22  COGNITION: Overall cognitive status: Within functional limits for tasks assessed     SENSATION: WFL  EDEMA:  Circumferential: L knee joint (49 cm.)/ R knee joint (49 cm.).  10 cm inf. To patella  for L mid-gastroc (42 cm.), R mid-gastroc (44 cm.).   MUSCLE LENGTH: Hamstrings: Right 86 deg; Left 86 deg Thomas test: TBD POSTURE: No Significant postural limitations, rounded shoulders, and forward head  PALPATION: No joint line tenderness.    LOWER EXTREMITY ROM:  Active ROM Right eval Left eval  Hip flexion 100 deg 102 deg.   Hip extension    Hip abduction [facility]  Hip adduction    Hip internal rotation    Hip external rotation    Knee flexion 95 deg. 94 deg.  Knee extension 0 deg.  -10 deg.  Ankle dorsiflexion    Ankle plantarflexion    Ankle inversion    Ankle eversion     (Blank rows = not tested)  LOWER EXTREMITY MMT:  MMT Right eval Left eval  Hip flexion 4-/5 4-/5  Hip extension    Hip abduction 4+/5 4/5  Hip adduction 4/5 4/5  Hip internal rotation 4/5 4/5  Hip external rotation 4/5 4/5  Knee flexion 5/5 (cramp) 5/5  Knee extension 4-/5 4/5  Ankle dorsiflexion    Ankle plantarflexion 4 4-  Ankle inversion    Ankle eversion     (Blank rows = not tested)  FUNCTIONAL TESTS:  - 5x sit to stand (2/15) - 13.44 sec.   GAIT: Distance walked: 60 ft. Assistive device utilized: None Level of assistance: Complete Independence Comments: Pt. Walked approx. 60 ft. In hallway and was cued to demonstrate her typical gait. Pt. Showed L antalgic gait showing that she favors her R leg over her left which is currently leading to compensation issues in her R knee. Pt. Has decreased stance time/step-length on her L LE compared to her R LE.  Limited to no arm swing until cued by PT.     TODAY'S TREATMENT:                                                                                                                              DATE: 03/11/2022   Subjective: Pt. Arrived to PT with 0/10 B knee pain on NPS. Pt. States she was able to go grocery shopping and stand (1 hour) with only a small increase (2/10 NPS) in pain. Pt. States improvement in sx. Overall especially  severity of pain with activity.    There Ex.:  -NuStep 10 min. L5. Seat 7.  UE/LE. O2 Sat: 96% HR: 102 bpm   -Sit to stand on a slightly elevated surface. 2x10 reps. PT elevated surface slightly to get pt. In a pain-free range.   -LAQ Ext. 3x10 reps for quad strength/endurance.    -Plantar flexion MMT. R-13 reps before fatigue. L-6 reps before fatigue.   -B Calf raises 2x10 reps. Single UE support required for balance.   - RTB lateral walking hip abduction. 2 laps(down/back) in agility ladder.   Manual therapy:  -Supine hamstring stretches/Piriformis/Hip Flex . 2x60 seconds each side. Calf OP applied.    PATIENT EDUCATION:  Education details: Pt. Educated on issued HEP via demonstration, verbal cues, and handouts. Pt. Educated on her strength and mobility deficits found during PT intial EVAL. Pt. Educated on frequency and POC moving forward.    Person educated: Patient Education method: Explanation, Demonstration, and Handouts Education comprehension: verbalized understanding and returned demonstration  HOME EXERCISE PROGRAM: Access Code: [email or web address] URL: [email or web address] Date: 03/04/2022 Prepared by: Joaquin Music  Exercises - Gastroc Stretch on Wall  - 1 x daily - 7 x weekly - 2 sets - 60 hold - Seated Hamstring Stretch  - 1 x daily - 7 x weekly - 2 sets - 60 hold - Supine Quadriceps Stretch with Strap on Table  - 1 x daily - 7 x weekly - 2 sets - 60 hold - Standing Hip Abduction with Counter Support  - 1 x daily - 7 x weekly - 3 sets - 10 reps - Supine Hip Adduction Isometric with Ball  - 1 x daily - 7 x weekly - 3 sets - 10 reps  ASSESSMENT:  CLINICAL IMPRESSION: Patient arrived to PT with no complaints of knee pain at rest. Pt. Had no increase in pain during there ex. Activity. Pt. Is having an overall decrease in sx. During both there ex. Activity and ADL such as grocery shopping. PT modified sit to stands to an elevated level to keep pt. In a pain-free  range to strengthen her LE's. Pt. required cuing during there ex. Activity to keep feet pointed forward to avoid compensation and isolate hip muscles. Pt. Requires minimal rest breaks and is demonstrating a steady increase in endurance. Pt. Educated on performing HEP in a pain-free range to progressively increase LE strength/ROM. Pt. Demonstrates tightness in her hamstrings/hip musculature that PT addressed via supine stretching. PT educated pt. On benefits of elevation and ice after activity. Pt. Will benefit from skilled PT treatment to increase B knee strength/ROM to improve overall pain free function.   OBJECTIVE IMPAIRMENTS: Abnormal gait, decreased activity tolerance, decreased endurance, decreased mobility, difficulty walking, decreased ROM, decreased strength, hypomobility, and pain.   ACTIVITY LIMITATIONS: carrying, lifting, bending, squatting, sleeping, and stairs  PARTICIPATION LIMITATIONS: cleaning and yard work  PERSONAL FACTORS: Age, Past/current experiences, and 1-2 comorbidities: Previous TKA w/ revisions, COVID asthma  are also affecting patient's functional outcome.   REHAB POTENTIAL: Good  CLINICAL DECISION MAKING: Stable/uncomplicated  EVALUATION COMPLEXITY: Low  GOALS: Goals reviewed with patient? Yes  SHORT TERM GOALS: Target date: 03/16/2022 Pt. Will be independent with HEP to improve strength and decrease B knee pain to improve overall pain-free function.  Baseline: Just issued.  Goal status: INITIAL  LONG TERM GOALS: Target date: 04/06/2022  Pt. Will increase FOTO to at least 55 to demonstrate increased function/endurance of B knees and increase overall activity level.  Baseline: 43 Goal status: INITIAL  2.  Pt. will be able to demonstrate a normalized gait pattern with low complaints  of pain(<2/10 NPS) to avoid compensation of the R LE/Low back .  Baseline: See gait section.  Goal status: INITIAL  3.  Pt. Will increase LE strength a half muscle grade to  improve B LE function and decrease load on joint to decrease B knee joint pain.  Baseline: 4-/5 Goal status: INITIAL   PLAN:  PT FREQUENCY: 2x/week  PT DURATION: 6 weeks  PLANNED INTERVENTIONS: Therapeutic exercises, Therapeutic activity, Neuromuscular re-education, Balance training, Gait training, Patient/Family education, Self Care, Joint mobilization, Joint manipulation, Stair training, Dry Needling, Cryotherapy, Moist heat, scar mobilization, Traction, and Manual therapy  PLAN FOR NEXT SESSION: Continue LE strengthening/endurance program. Assess/Update HEP.   Jordany Russett B. Rogers Blocker, SPT Pura Spice, PT, DPT # [phone] 11:38 AM,03/11/22

## 2022-03-16 ENCOUNTER — Ambulatory Visit: Payer: Medicare HMO | Attending: Student | Admitting: Physical Therapy

## 2022-03-16 ENCOUNTER — Encounter: Payer: Self-pay | Admitting: Physical Therapy

## 2022-03-16 DIAGNOSIS — G8929 Other chronic pain: Secondary | ICD-10-CM | POA: Diagnosis not present

## 2022-03-16 DIAGNOSIS — M25661 Stiffness of right knee, not elsewhere classified: Secondary | ICD-10-CM | POA: Diagnosis not present

## 2022-03-16 DIAGNOSIS — R269 Unspecified abnormalities of gait and mobility: Secondary | ICD-10-CM | POA: Insufficient documentation

## 2022-03-16 DIAGNOSIS — M6281 Muscle weakness (generalized): Secondary | ICD-10-CM | POA: Insufficient documentation

## 2022-03-16 DIAGNOSIS — M25561 Pain in right knee: Secondary | ICD-10-CM | POA: Insufficient documentation

## 2022-03-16 DIAGNOSIS — M25562 Pain in left knee: Secondary | ICD-10-CM | POA: Diagnosis not present

## 2022-03-16 DIAGNOSIS — M25662 Stiffness of left knee, not elsewhere classified: Secondary | ICD-10-CM | POA: Diagnosis not present

## 2022-03-16 NOTE — Therapy (Signed)
OUTPATIENT PHYSICAL THERAPY LOWER EXTREMITY TREATMENT   Patient Name: Leslie Koch MRN: [id number] DOB:Dec 31, 1955, 67 y.o., female Today's Date: 03/16/2022  END OF SESSION:  PT End of Session - 03/16/22 0945     Visit Number 7    Number of Visits 13    Date for PT Re-Evaluation 04/06/22    PT Start Time 0946    PT Stop Time 1033    PT Time Calculation (min) 47 min    Activity Tolerance Patient tolerated treatment well;No increased pain    Behavior During Therapy WFL for tasks assessed/performed             Past Medical History:  Diagnosis Date   Arthritis    GERD (gastroesophageal reflux disease)    Hypertension    PONV (postoperative nausea and vomiting)    Past Surgical History:  Procedure Laterality Date   ABDOMINAL HYSTERECTOMY  01/1999   COLONOSCOPY WITH PROPOFOL N/A 06/22/2021   Procedure: COLONOSCOPY WITH PROPOFOL;  Surgeon: Lin Landsman, MD;  Location: [facility];  Service: Gastroenterology;  Laterality: N/A;   CYST EXCISION  1974   KNEE SURGERY Right 07/2010   TOTAL KNEE ARTHROPLASTY Right 10/12/2012   TOTAL KNEE ARTHROPLASTY WITH REVISION COMPONENTS Right 02/11/2016   Procedure: RIGHT TOTAL KNEE ARTHROPLASTY REVISION;  Surgeon: Gaynelle Arabian, MD;  Location: [facility];  Service: Orthopedics;  Laterality: Right;   TUBAL LIGATION  1984   Patient Active Problem List   Diagnosis Date Noted   Adenomatous polyp of ascending colon    Screening for colon cancer 05/05/2021   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 Wynelle Link)  L knee osteoarthritis  THERAPY DIAG:  Chronic pain of right knee  Chronic pain of left knee  Joint stiffness of both knees  Muscle weakness (generalized)  Gait difficulty  Rationale for Evaluation and Treatment: Rehabilitation  ONSET DATE: 2014  SUBJECTIVE:   SUBJECTIVE STATEMENT: Pt. Reports chronic R knee issues and had TKA in 2014 and 2 revisions.  Pt. Had 1st revision by Dr. Rudene Christians and 2nd revision by Dr. Wynelle Link.  Increase B knee pain with walking/ yardwork.  Pt. Reports pain ranges from 6-10/10 B knee pain.  Pt. Reports no pain at rest.  Pt. Hoping to have L TKA in 2 years because she takes care of grandson at this time.  Pt. Has h/o L knee injections but not for past 1.5 years.  Pt. Retired from work and lives with husband.  Pt. Cares for mother (next door neighbor) and grandson.  L carpal tunnel syndrome and wearing brace.  R thumb pain in joint.     PERTINENT HISTORY: See MD notes.    PAIN:  Are you having pain? Yes: NPRS scale: 0/10 Pain location: R knee/ L knee Pain description: sharp/ shooting/ stabbing/ burning Aggravating factors: Increase activity Relieving factors: Ice/ Celebrex  PRECAUTIONS: None  WEIGHT BEARING RESTRICTIONS: No  FALLS:  Has patient fallen in last 6 months? No  LIVING ENVIRONMENT: Lives with: lives with their family Lives in: House/apartment Stairs:  Yes: External: 3-4 steps; on right going up Has following equipment at home: Single point cane, Walker - 2 wheeled, and Grab bars  OCCUPATION: Retired  PLOF: Independent  PATIENT GOALS: Improve B LE muscle strength/ decrease pain.    NEXT MD VISIT: 03/26/22  OBJECTIVE:   DIAGNOSTIC FINDINGS: See MD notes  PATIENT SURVEYS:  FOTO initial 43/ goal 30  COGNITION: Overall cognitive status: Within functional limits for tasks assessed     SENSATION: WFL  EDEMA:  Circumferential: L knee joint (49 cm.)/ R knee joint (49 cm.).  10 cm inf. To patella  for L mid-gastroc (42 cm.), R mid-gastroc (44 cm.).   MUSCLE LENGTH: Hamstrings: Right 86 deg; Left 86 deg Thomas test: TBD POSTURE: No Significant postural limitations, rounded shoulders, and forward head  PALPATION: No joint line tenderness.    LOWER EXTREMITY ROM:  Active ROM Right eval Left eval  Hip flexion 100 deg 102 deg.   Hip extension    Hip abduction [facility]  Hip adduction    Hip internal rotation    Hip external rotation    Knee flexion 95 deg. 94 deg.  Knee extension 0 deg.  -10 deg.  Ankle dorsiflexion    Ankle plantarflexion    Ankle inversion    Ankle eversion     (Blank rows = not tested)  LOWER EXTREMITY MMT:  MMT Right eval Left eval  Hip flexion 4-/5 4-/5  Hip extension    Hip abduction 4+/5 4/5  Hip adduction 4/5 4/5  Hip internal rotation 4/5 4/5  Hip external rotation 4/5 4/5  Knee flexion 5/5 (cramp) 5/5  Knee extension 4-/5 4/5  Ankle dorsiflexion    Ankle plantarflexion 4 4-  Ankle inversion    Ankle eversion     (Blank rows = not tested)  FUNCTIONAL TESTS:  - 5x sit to stand (2/15) - 13.44 sec.   GAIT: Distance walked: 60 ft. Assistive device utilized: None Level of assistance: Complete Independence Comments: Pt. Walked approx. 60 ft. In hallway and was cued to demonstrate her typical gait. Pt. Showed L antalgic gait showing that she favors her R leg over her left which is currently leading to compensation issues in her R knee. Pt. Has decreased stance time/step-length on her L LE compared to her R LE.  Limited to no arm swing until cued by PT.    2/29:  Plantar flexion MMT. R-13 reps before fatigue. L-6 reps before fatigue.   TODAY'S TREATMENT:                                                                                                                              DATE: 03/16/2022   Subjective: Pt. Reports increase B knee pain last night (8/10) after working in yard.  Pt. States she has been approved for Synvisc injection for  R knee.  Pt. Arrived to PT with 0/10 B knee pain on NPS but reports "a dull ache"  on outside of R knee.  Pt. Has f/u with Dr. Wynelle Link on 3/15 and may reschedule until after gel injections.     There Ex.:   -NuStep 10 min. L5. Seat 8. UE/LE.    -walking in //-bars with high marching with added alt. UE/LE touches 3 laps (mirror feedback)- O2 Sat: 97% HR: 111 bpm.  Lateral walking in //-bars with light UE assist as needed 3 laps.    -Sit to stand on a slightly elevated surface. 2x10 reps. PT elevated surface slightly to get pt. In a pain-free range.   -LAQ Ext./ marching 2x15 reps for quad strength/endurance.    -walking alt. UE/LE touches in //-bars with cuing to maintain proper BOS.  Good balance but challenged.    Manual therapy:  No charge  -Supine hamstring stretches/Piriformis/Hip Flex . 2x60 seconds each side. Calf OP applied.   No pain reported but good stretches, esp. With hamstring.   PATIENT EDUCATION:  Education details: Pt. Educated on issued HEP via demonstration, verbal cues, and handouts. Pt. Educated on her strength and mobility deficits found during PT intial EVAL. Pt. Educated on frequency and POC moving forward.    Person educated: Patient Education method: Explanation, Demonstration, and Handouts Education comprehension: verbalized understanding and returned demonstration  HOME EXERCISE PROGRAM: Access Code: [email or web address] URL: [email or web address] Date: 03/04/2022 Prepared by: Joaquin Music  Exercises - Gastroc Stretch on Wall  - 1 x daily - 7 x weekly - 2 sets - 60 hold - Seated Hamstring Stretch  - 1 x daily - 7 x weekly - 2 sets - 60 hold - Supine Quadriceps Stretch with Strap on Table  - 1 x daily - 7 x weekly - 2 sets - 60 hold - Standing Hip Abduction with Counter Support  - 1 x daily - 7 x weekly - 3 sets - 10 reps - Supine Hip Adduction Isometric with Ball  - 1 x daily - 7 x weekly - 3 sets - 10 reps  ASSESSMENT:  CLINICAL IMPRESSION: Pt. Had  a recent increase in LE muscle soreness/ pain after working in [employer] yesterday.  Pt. Reports no knee pain prior to tx. But was hurting last night.  PT modified sit to stands to an elevated level to keep pt. In a pain-free range to strengthen her LE's. Pt. required cuing during there ex. Activity to keep feet pointed forward to avoid compensation and isolate hip muscles. Pt. Requires minimal rest breaks and is demonstrating a steady increase in endurance. Pt. Educated on performing HEP in a pain-free range to progressively increase LE strength/ROM. Pt. Demonstrates tightness in her hamstrings/hip musculature that PT addressed via supine stretching. PT educated pt. On benefits of elevation and ice after activity. Pt. Will benefit from skilled PT treatment to increase B knee strength/ROM to improve overall pain free function.   OBJECTIVE IMPAIRMENTS: Abnormal gait, decreased activity tolerance, decreased endurance, decreased mobility, difficulty walking, decreased ROM, decreased strength, hypomobility, and pain.   ACTIVITY LIMITATIONS: carrying, lifting, bending, squatting, sleeping, and stairs  PARTICIPATION LIMITATIONS: cleaning and yard work  PERSONAL FACTORS: Age, Past/current experiences, and 1-2 comorbidities: Previous TKA w/ revisions, COVID asthma  are also affecting patient's functional outcome.   REHAB POTENTIAL: Good  CLINICAL DECISION MAKING: Stable/uncomplicated  EVALUATION COMPLEXITY: Low  GOALS: Goals reviewed with patient? Yes  SHORT TERM GOALS: Target date: 03/16/2022 Pt. Will be independent with HEP to improve strength and decrease B knee pain to improve overall pain-free function.  Baseline: Just issued.  Goal  status: Goal met  LONG TERM GOALS: Target date: 04/06/2022  Pt. Will increase FOTO to at least 55 to demonstrate increased function/endurance of B knees and increase overall activity level.  Baseline: 43 Goal status: INITIAL  2.  Pt. will be able to  demonstrate a normalized gait pattern with low complaints of pain(<2/10 NPS) to avoid compensation of the R LE/Low back .  Baseline: See gait section.  Goal status: INITIAL  3.  Pt. Will increase LE strength a half muscle grade to improve B LE function and decrease load on joint to decrease B knee joint pain.  Baseline: 4-/5 Goal status: INITIAL   PLAN:  PT FREQUENCY: 2x/week  PT DURATION: 6 weeks  PLANNED INTERVENTIONS: Therapeutic exercises, Therapeutic activity, Neuromuscular re-education, Balance training, Gait training, Patient/Family education, Self Care, Joint mobilization, Joint manipulation, Stair training, Dry Needling, Cryotherapy, Moist heat, scar mobilization, Traction, and Manual therapy  PLAN FOR NEXT SESSION: Continue LE strengthening/endurance program. Assess/Update HEP.    Pura Spice, PT, DPT # [phone] 12:55 PM,03/16/22

## 2022-03-18 ENCOUNTER — Telehealth: Payer: Self-pay | Admitting: Family Medicine

## 2022-03-18 ENCOUNTER — Ambulatory Visit: Payer: Medicare HMO | Admitting: Physical Therapy

## 2022-03-18 ENCOUNTER — Encounter: Payer: Self-pay | Admitting: Physical Therapy

## 2022-03-18 DIAGNOSIS — G8929 Other chronic pain: Secondary | ICD-10-CM

## 2022-03-18 DIAGNOSIS — R269 Unspecified abnormalities of gait and mobility: Secondary | ICD-10-CM | POA: Diagnosis not present

## 2022-03-18 DIAGNOSIS — M6281 Muscle weakness (generalized): Secondary | ICD-10-CM

## 2022-03-18 DIAGNOSIS — M25661 Stiffness of right knee, not elsewhere classified: Secondary | ICD-10-CM | POA: Diagnosis not present

## 2022-03-18 DIAGNOSIS — M25561 Pain in right knee: Secondary | ICD-10-CM | POA: Diagnosis not present

## 2022-03-18 DIAGNOSIS — M25662 Stiffness of left knee, not elsewhere classified: Secondary | ICD-10-CM | POA: Diagnosis not present

## 2022-03-18 DIAGNOSIS — M25562 Pain in left knee: Secondary | ICD-10-CM | POA: Diagnosis not present

## 2022-03-18 NOTE — Telephone Encounter (Signed)
Contacted Melven Sartorius to schedule their annual wellness visit. Appointment made for 05/24/2022.  [place] Direct Dial: [phone]

## 2022-03-18 NOTE — Therapy (Signed)
OUTPATIENT PHYSICAL THERAPY LOWER EXTREMITY TREATMENT   Patient Name: Leslie Koch MRN: [id number] DOB:1955/08/25, 67 y.o., female Today's Date: 03/18/2022  END OF SESSION:  PT End of Session - 03/18/22 0953     Visit Number 8    Number of Visits 13    Date for PT Re-Evaluation 04/06/22    PT Start Time 0950    PT Stop Time [id number]    PT Time Calculation (min) 45 min    Activity Tolerance Patient tolerated treatment well;No increased pain    Behavior During Therapy WFL for tasks assessed/performed             Past Medical History:  Diagnosis Date   Arthritis    GERD (gastroesophageal reflux disease)    Hypertension    PONV (postoperative nausea and vomiting)    Past Surgical History:  Procedure Laterality Date   ABDOMINAL HYSTERECTOMY  01/1999   COLONOSCOPY WITH PROPOFOL N/A 06/22/2021   Procedure: COLONOSCOPY WITH PROPOFOL;  Surgeon: Lin Landsman, MD;  Location: [facility];  Service: Gastroenterology;  Laterality: N/A;   CYST EXCISION  1974   KNEE SURGERY Right 07/2010   TOTAL KNEE ARTHROPLASTY Right 10/12/2012   TOTAL KNEE ARTHROPLASTY WITH REVISION COMPONENTS Right 02/11/2016   Procedure: RIGHT TOTAL KNEE ARTHROPLASTY REVISION;  Surgeon: Gaynelle Arabian, MD;  Location: [facility];  Service: Orthopedics;  Laterality: Right;   TUBAL LIGATION  1984   Patient Active Problem List   Diagnosis Date Noted   Adenomatous polyp of ascending colon    Screening for colon cancer 05/05/2021   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) 09/01/2020   Anxiety 08/06/2014   Cannot sleep 08/06/2014   Avitaminosis D 08/06/2014    PCP: Gwyneth Sprout, FNP  REFERRING PROVIDER: Derl Barrow, PA  REFERRING DIAG:  s/p R TKA revision (Dr. Wynelle Link)  L knee osteoarthritis  THERAPY DIAG:  Chronic pain of right knee  Chronic pain of left knee  Joint stiffness of both knees  Muscle weakness (generalized)  Gait difficulty  Rationale for Evaluation and Treatment: Rehabilitation  ONSET DATE: 2014  SUBJECTIVE:   SUBJECTIVE STATEMENT: Pt. Reports chronic R knee issues and had TKA in 2014 and 2 revisions.  Pt. Had 1st revision by Dr. Rudene Christians and 2nd revision by Dr. Wynelle Link.  Increase B knee pain with walking/ yardwork.  Pt. Reports pain ranges from 6-10/10 B knee pain.  Pt. Reports no pain at rest.  Pt. Hoping to have L TKA in 2 years because she takes care of grandson at this time.  Pt. Has h/o L knee injections but not for past 1.5 years.  Pt. Retired from work and lives with husband.  Pt. Cares for mother (next door neighbor) and grandson.  L carpal tunnel syndrome and wearing brace.  R thumb pain in joint.     PERTINENT HISTORY: See MD notes.    PAIN:  Are you having pain? Yes: NPRS scale: 0/10 Pain location: R knee/ L knee Pain description: sharp/ shooting/ stabbing/ burning Aggravating factors: Increase activity Relieving factors: Ice/ Celebrex  PRECAUTIONS: None  WEIGHT BEARING RESTRICTIONS: No  FALLS:  Has patient fallen in last 6 months? No  LIVING ENVIRONMENT: Lives with: lives with their family Lives in: House/apartment Stairs:  Yes: External: 3-4 steps; on right going up Has following equipment at home: Single point cane, Walker - 2 wheeled, and Grab bars  OCCUPATION: Retired  PLOF: Independent  PATIENT GOALS: Improve B LE muscle strength/ decrease pain.    NEXT MD VISIT: 03/26/22  OBJECTIVE:   DIAGNOSTIC FINDINGS: See MD notes  PATIENT SURVEYS:  FOTO initial 43/ goal 76  COGNITION: Overall cognitive status: Within functional limits for tasks assessed     SENSATION: WFL  EDEMA:  Circumferential: L knee joint (49 cm.)/ R knee joint (49 cm.).  10 cm inf. To patella  for L mid-gastroc (42 cm.), R mid-gastroc (44 cm.).   MUSCLE LENGTH: Hamstrings: Right 86 deg; Left 86 deg Thomas test: TBD POSTURE: No Significant postural limitations, rounded shoulders, and forward head  PALPATION: No joint line tenderness.    LOWER EXTREMITY ROM:  Active ROM Right eval Left eval  Hip flexion 100 deg 102 deg.   Hip extension    Hip abduction [facility]  Hip adduction    Hip internal rotation    Hip external rotation    Knee flexion 95 deg. 94 deg.  Knee extension 0 deg.  -10 deg.  Ankle dorsiflexion    Ankle plantarflexion    Ankle inversion    Ankle eversion     (Blank rows = not tested)  LOWER EXTREMITY MMT:  MMT Right eval Left eval  Hip flexion 4-/5 4-/5  Hip extension    Hip abduction 4+/5 4/5  Hip adduction 4/5 4/5  Hip internal rotation 4/5 4/5  Hip external rotation 4/5 4/5  Knee flexion 5/5 (cramp) 5/5  Knee extension 4-/5 4/5  Ankle dorsiflexion    Ankle plantarflexion 4 4-  Ankle inversion    Ankle eversion     (Blank rows = not tested)  FUNCTIONAL TESTS:  - 5x sit to stand (2/15) - 13.44 sec.   GAIT: Distance walked: 60 ft. Assistive device utilized: None Level of assistance: Complete Independence Comments: Pt. Walked approx. 60 ft. In hallway and was cued to demonstrate her typical gait. Pt. Showed L antalgic gait showing that she favors her R leg over her left which is currently leading to compensation issues in her R knee. Pt. Has decreased stance time/step-length on her L LE compared to her R LE.  Limited to no arm swing until cued by PT.    2/29:  Plantar flexion MMT. R-13 reps before fatigue. L-6 reps before fatigue.   TODAY'S TREATMENT:                                                                                                                              DATE: 03/18/2022   Subjective: Pt. cancelled f/u with Dr. Wynelle Link on 3/15 unitl after series of 3 Synvisc injections in April.  Pt. Was sore after last tx. Session  but doing well today.  Pt. Planning to work in yard after PT tx. Session today.  Pt.  Reports slight increase in low back pain after grocery shopping 2 days ago.       There Ex.:   -NuStep 10 min. L5. Seat 8. UE/LE.  Discussed yard work.  -2nd step lunges/ gastroc stretches 2x each side with static holds 30 sec. Each.    -Standing hip ex.: marching/ hip abduction/ hip extension/ heel raises 20x each.    -LAQ Ext./ marching 2x15 reps for quad strength/endurance.    -Supine marching with no rotn./ good back support on mat table 20x.    -Supine hamstring stretches/Piriformis/Hip Flex . 2x60 seconds each side. Calf OP applied.   No pain reported but good stretches, esp. With hamstring.   PATIENT EDUCATION:  Education details: Pt. Educated on issued HEP via demonstration, verbal cues, and handouts. Pt. Educated on her strength and mobility deficits found during PT intial EVAL. Pt. Educated on frequency and POC moving forward.    Person educated: Patient Education method: Explanation, Demonstration, and Handouts Education comprehension: verbalized understanding and returned demonstration  HOME EXERCISE PROGRAM: Access Code: [email or web address] URL: [email or web address] Date: 03/04/2022 Prepared by: Joaquin Music  Exercises - Gastroc Stretch on Wall  - 1 x daily - 7 x weekly - 2 sets - 60 hold - Seated Hamstring Stretch  - 1 x daily - 7 x weekly - 2 sets - 60 hold - Supine Quadriceps Stretch with Strap on Table  - 1 x daily - 7 x weekly - 2 sets - 60 hold - Standing Hip Abduction with Counter Support  - 1 x daily - 7 x weekly - 3 sets - 10 reps - Supine Hip Adduction Isometric with Ball  - 1 x daily - 7 x weekly - 3 sets - 10 reps  ASSESSMENT:  CLINICAL IMPRESSION: Pt. Requires minimal rest breaks and is demonstrating a steady increase in endurance. Pt. Educated on performing HEP in a pain-free range to progressively increase LE strength/ROM. Pt. Demonstrates tightness in her  hamstrings/hip musculature that PT addressed via supine stretching. PT educated pt. On benefits of elevation and ice after activity. Pt. Will benefit from skilled PT treatment to increase B knee strength/ROM to improve overall pain free function.   OBJECTIVE IMPAIRMENTS: Abnormal gait, decreased activity tolerance, decreased endurance, decreased mobility, difficulty walking, decreased ROM, decreased strength, hypomobility, and pain.   ACTIVITY LIMITATIONS: carrying, lifting, bending, squatting, sleeping, and stairs  PARTICIPATION LIMITATIONS: cleaning and yard work  PERSONAL FACTORS: Age, Past/current experiences, and 1-2 comorbidities: Previous TKA w/ revisions, COVID asthma  are also affecting patient's functional outcome.   REHAB POTENTIAL: Good  CLINICAL DECISION MAKING: Stable/uncomplicated  EVALUATION COMPLEXITY: Low  GOALS: Goals reviewed with patient? Yes  SHORT TERM GOALS: Target date: 03/16/2022 Pt. Will be independent with HEP to improve strength and decrease B knee pain to improve overall pain-free function.  Baseline: Just issued.  Goal status: Goal met  LONG TERM GOALS: Target date: 04/06/2022  Pt. Will increase FOTO to at least 55 to demonstrate increased function/endurance of B knees and increase overall activity level.  Baseline: 43 Goal status: INITIAL  2.  Pt. will be able to demonstrate a normalized gait pattern with low complaints of pain(<2/10 NPS) to avoid compensation of the R LE/Low back .  Baseline: See gait section.  Goal status: INITIAL  3.  Pt. Will increase LE strength a half muscle grade to improve B LE function and decrease load on joint to decrease B knee joint pain.  Baseline: 4-/5 Goal status: INITIAL  PLAN:  PT FREQUENCY:  2x/week  PT DURATION: 6 weeks  PLANNED INTERVENTIONS: Therapeutic exercises, Therapeutic activity, Neuromuscular re-education, Balance training, Gait training, Patient/Family education, Self Care, Joint mobilization,  Joint manipulation, Stair training, Dry Needling, Cryotherapy, Moist heat, scar mobilization, Traction, and Manual therapy  PLAN FOR NEXT SESSION: Continue LE strengthening/endurance program.    Pura Spice, PT, DPT # [phone] 12:42 PM,03/18/22

## 2022-03-23 ENCOUNTER — Ambulatory Visit: Payer: Medicare HMO | Admitting: Physical Therapy

## 2022-03-23 ENCOUNTER — Encounter: Payer: Self-pay | Admitting: Physical Therapy

## 2022-03-23 DIAGNOSIS — R269 Unspecified abnormalities of gait and mobility: Secondary | ICD-10-CM

## 2022-03-23 DIAGNOSIS — M6281 Muscle weakness (generalized): Secondary | ICD-10-CM | POA: Diagnosis not present

## 2022-03-23 DIAGNOSIS — M25661 Stiffness of right knee, not elsewhere classified: Secondary | ICD-10-CM

## 2022-03-23 DIAGNOSIS — G8929 Other chronic pain: Secondary | ICD-10-CM | POA: Diagnosis not present

## 2022-03-23 DIAGNOSIS — M25662 Stiffness of left knee, not elsewhere classified: Secondary | ICD-10-CM | POA: Diagnosis not present

## 2022-03-23 DIAGNOSIS — M25562 Pain in left knee: Secondary | ICD-10-CM | POA: Diagnosis not present

## 2022-03-23 DIAGNOSIS — M25561 Pain in right knee: Secondary | ICD-10-CM | POA: Diagnosis not present

## 2022-03-23 NOTE — Therapy (Signed)
OUTPATIENT PHYSICAL THERAPY LOWER EXTREMITY TREATMENT   Patient Name: Leslie Koch MRN: [id number] DOB:10-11-1955, 67 y.o., female Today's Date: 03/23/2022  END OF SESSION:  PT End of Session - 03/23/22 0937     Visit Number 9    Number of Visits 13    Date for PT Re-Evaluation 04/06/22    PT Start Time 0938    PT Stop Time 1025    PT Time Calculation (min) 47 min    Activity Tolerance Patient tolerated treatment well;No increased pain    Behavior During Therapy WFL for tasks assessed/performed             Past Medical History:  Diagnosis Date   Arthritis    GERD (gastroesophageal reflux disease)    Hypertension    PONV (postoperative nausea and vomiting)    Past Surgical History:  Procedure Laterality Date   ABDOMINAL HYSTERECTOMY  01/1999   COLONOSCOPY WITH PROPOFOL N/A 06/22/2021   Procedure: COLONOSCOPY WITH PROPOFOL;  Surgeon: Lin Landsman, MD;  Location: [facility];  Service: Gastroenterology;  Laterality: N/A;   CYST EXCISION  1974   KNEE SURGERY Right 07/2010   TOTAL KNEE ARTHROPLASTY Right 10/12/2012   TOTAL KNEE ARTHROPLASTY WITH REVISION COMPONENTS Right 02/11/2016   Procedure: RIGHT TOTAL KNEE ARTHROPLASTY REVISION;  Surgeon: Gaynelle Arabian, MD;  Location: [facility];  Service: Orthopedics;  Laterality: Right;   TUBAL LIGATION  1984   Patient Active Problem List   Diagnosis Date Noted   Adenomatous polyp of ascending colon    Screening for colon cancer 05/05/2021   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 Wynelle Link)  L knee osteoarthritis  THERAPY DIAG:  Chronic pain of right knee  Chronic pain of left knee  Joint stiffness of both knees  Muscle weakness (generalized)  Gait difficulty  Rationale for Evaluation and Treatment: Rehabilitation  ONSET DATE: 2014  SUBJECTIVE:   SUBJECTIVE STATEMENT: Pt. Reports chronic R knee issues and had TKA in 2014 and 2 revisions.  Pt. Had 1st revision by Dr. Rudene Christians and 2nd revision by Dr. Wynelle Link.  Increase B knee pain with walking/ yardwork.  Pt. Reports pain ranges from 6-10/10 B knee pain.  Pt. Reports no pain at rest.  Pt. Hoping to have L TKA in 2 years because she takes care of grandson at this time.  Pt. Has h/o L knee injections but not for past 1.5 years.  Pt. Retired from work and lives with husband.  Pt. Cares for mother (next door neighbor) and grandson.  L carpal tunnel syndrome and wearing brace.  R thumb pain in joint.     PERTINENT HISTORY: See MD notes.    PAIN:  Are you having pain? Yes: NPRS scale: 0/10 Pain location: R knee/ L knee Pain description: sharp/ shooting/ stabbing/ burning Aggravating factors: Increase activity Relieving factors: Ice/ Celebrex  PRECAUTIONS: None  WEIGHT BEARING RESTRICTIONS: No  FALLS:  Has patient fallen in last 6 months? No  LIVING ENVIRONMENT: Lives with: lives with their family Lives in: House/apartment Stairs:  Yes: External: 3-4 steps; on right going up Has following equipment at home: Single point cane, Walker - 2 wheeled, and Grab bars  OCCUPATION: Retired  PLOF: Independent  PATIENT GOALS: Improve B LE muscle strength/ decrease pain.    NEXT MD VISIT: 03/26/22  OBJECTIVE:   DIAGNOSTIC FINDINGS: See MD notes  PATIENT SURVEYS:  FOTO initial 43/ goal 26  COGNITION: Overall cognitive status: Within functional limits for tasks assessed     SENSATION: WFL  EDEMA:  Circumferential: L knee joint (49 cm.)/ R knee joint (49 cm.).  10 cm inf. To patella  for L mid-gastroc (42 cm.), R mid-gastroc (44 cm.).   MUSCLE LENGTH: Hamstrings: Right 86 deg; Left 86 deg Thomas test: TBD POSTURE: No Significant postural limitations, rounded shoulders, and forward head  PALPATION: No joint line tenderness.    LOWER EXTREMITY ROM:  Active ROM Right eval Left eval  Hip flexion 100 deg 102 deg.   Hip extension    Hip abduction [facility]  Hip adduction    Hip internal rotation    Hip external rotation    Knee flexion 95 deg. 94 deg.  Knee extension 0 deg.  -10 deg.  Ankle dorsiflexion    Ankle plantarflexion    Ankle inversion    Ankle eversion     (Blank rows = not tested)  LOWER EXTREMITY MMT:  MMT Right eval Left eval  Hip flexion 4-/5 4-/5  Hip extension    Hip abduction 4+/5 4/5  Hip adduction 4/5 4/5  Hip internal rotation 4/5 4/5  Hip external rotation 4/5 4/5  Knee flexion 5/5 (cramp) 5/5  Knee extension 4-/5 4/5  Ankle dorsiflexion    Ankle plantarflexion 4 4-  Ankle inversion    Ankle eversion     (Blank rows = not tested)  FUNCTIONAL TESTS:  - 5x sit to stand (2/15) - 13.44 sec.   GAIT: Distance walked: 60 ft. Assistive device utilized: None Level of assistance: Complete Independence Comments: Pt. Walked approx. 60 ft. In hallway and was cued to demonstrate her typical gait. Pt. Showed L antalgic gait showing that she favors her R leg over her left which is currently leading to compensation issues in her R knee. Pt. Has decreased stance time/step-length on her L LE compared to her R LE.  Limited to no arm swing until cued by PT.    2/29:  Plantar flexion MMT. R-13 reps before fatigue. L-6 reps before fatigue.   TODAY'S TREATMENT:                                                                                                                              DATE: 03/23/2022   Subjective: Pt. Did yardwork yesterday and was not too sore today.  Pt. Was pulling grass to clean up mulch bed.    There Ex.:   -Standing  hip ex. 3#: marching/ lateral walking/ hip extension/ heel raises/ hamstring curls 20x each.   -  Step ups/ down on 6" step with light to no UE assist.  Pt. Has no issues with light UE assist.     -3# ankle wts.: seated LAQ/ marching 2x15 reps for quad strength/endurance.    -NuStep 10 min. L5. Seat 8. UE/LE.  Discussed yard work.  -Supine hamstring stretches/Piriformis/Hip Flex . 2x60 seconds each side. Calf OP applied.   No pain reported but good stretches, esp. With hamstring.   PATIENT EDUCATION:  Education details: Pt. Educated on issued HEP via demonstration, verbal cues, and handouts. Pt. Educated on her strength and mobility deficits found during PT intial EVAL. Pt. Educated on frequency and POC moving forward.    Person educated: Patient Education method: Explanation, Demonstration, and Handouts Education comprehension: verbalized understanding and returned demonstration  HOME EXERCISE PROGRAM: Access Code: [email or web address] URL: [email or web address] Date: 03/04/2022 Prepared by: Joaquin Music  Exercises - Gastroc Stretch on Wall  - 1 x daily - 7 x weekly - 2 sets - 60 hold - Seated Hamstring Stretch  - 1 x daily - 7 x weekly - 2 sets - 60 hold - Supine Quadriceps Stretch with Strap on Table  - 1 x daily - 7 x weekly - 2 sets - 60 hold - Standing Hip Abduction with Counter Support  - 1 x daily - 7 x weekly - 3 sets - 10 reps - Supine Hip Adduction Isometric with Ball  - 1 x daily - 7 x weekly - 3 sets - 10 reps  ASSESSMENT:  CLINICAL IMPRESSION: Pt. Requires minimal rest breaks and is demonstrating a steady increase in endurance. Pt. Educated on performing HEP in a pain-free range to progressively increase LE strength/ROM. Pt. Demonstrates tightness in her hamstrings/hip musculature that PT addressed via supine stretching. PT educated pt. On benefits of elevation and ice after activity. Pt. Will benefit from skilled PT treatment to increase B knee strength/ROM to improve  overall pain free function.   OBJECTIVE IMPAIRMENTS: Abnormal gait, decreased activity tolerance, decreased endurance, decreased mobility, difficulty walking, decreased ROM, decreased strength, hypomobility, and pain.   ACTIVITY LIMITATIONS: carrying, lifting, bending, squatting, sleeping, and stairs  PARTICIPATION LIMITATIONS: cleaning and yard work  PERSONAL FACTORS: Age, Past/current experiences, and 1-2 comorbidities: Previous TKA w/ revisions, COVID asthma  are also affecting patient's functional outcome.   REHAB POTENTIAL: Good  CLINICAL DECISION MAKING: Stable/uncomplicated  EVALUATION COMPLEXITY: Low  GOALS: Goals reviewed with patient? Yes  SHORT TERM GOALS: Target date: 03/16/2022 Pt. Will be independent with HEP to improve strength and decrease B knee pain to improve overall pain-free function.  Baseline: Just issued.  Goal status: Goal met  LONG TERM GOALS: Target date: 04/06/2022  Pt. Will increase FOTO to at least 55 to demonstrate increased function/endurance of B knees and increase overall activity level.  Baseline: 43 Goal status: INITIAL  2.  Pt. will be able to demonstrate a normalized gait pattern with low complaints of pain(<2/10 NPS) to avoid compensation of the R LE/Low back .  Baseline: See gait section.  Goal status: INITIAL  3.  Pt. Will increase LE strength a half muscle grade to improve B LE function and decrease load on joint to decrease B knee joint pain.  Baseline: 4-/5 Goal status: INITIAL  PLAN:  PT FREQUENCY: 2x/week  PT DURATION: 6 weeks  PLANNED INTERVENTIONS: Therapeutic exercises, Therapeutic activity, Neuromuscular re-education, Balance training, Gait training, Patient/Family education, Self Care, Joint mobilization, Joint manipulation, Stair training, Dry Needling, Cryotherapy, Moist heat, scar mobilization, Traction, and Manual therapy  PLAN  FOR NEXT SESSION: Continue LE strengthening/endurance program.    Pura Spice, PT,  DPT # [phone] 6:00 PM,03/23/22

## 2022-03-25 ENCOUNTER — Ambulatory Visit: Payer: Medicare HMO | Admitting: Physical Therapy

## 2022-03-25 DIAGNOSIS — M25662 Stiffness of left knee, not elsewhere classified: Secondary | ICD-10-CM | POA: Diagnosis not present

## 2022-03-25 DIAGNOSIS — G8929 Other chronic pain: Secondary | ICD-10-CM

## 2022-03-25 DIAGNOSIS — R269 Unspecified abnormalities of gait and mobility: Secondary | ICD-10-CM | POA: Diagnosis not present

## 2022-03-25 DIAGNOSIS — M6281 Muscle weakness (generalized): Secondary | ICD-10-CM

## 2022-03-25 DIAGNOSIS — M25562 Pain in left knee: Secondary | ICD-10-CM | POA: Diagnosis not present

## 2022-03-25 DIAGNOSIS — M25661 Stiffness of right knee, not elsewhere classified: Secondary | ICD-10-CM | POA: Diagnosis not present

## 2022-03-25 DIAGNOSIS — M25561 Pain in right knee: Secondary | ICD-10-CM | POA: Diagnosis not present

## 2022-03-25 NOTE — Therapy (Signed)
OUTPATIENT PHYSICAL THERAPY LOWER EXTREMITY TREATMENT Physical Therapy Progress Note  Dates of reporting period  02/23/22  to  03/25/22   Patient Name: Leslie Koch MRN: [id number] DOB:Jul 09, 1955, 67 y.o., female Today's Date: 03/25/2022  END OF SESSION:  PT End of Session - 03/25/22 1016     Visit Number 10    Number of Visits 13    Date for PT Re-Evaluation 04/06/22    PT Start Time 1026    PT Stop Time 1115    PT Time Calculation (min) 49 min    Activity Tolerance Patient tolerated treatment well;No increased pain    Behavior During Therapy WFL for tasks assessed/performed             Past Medical History:  Diagnosis Date   Arthritis    GERD (gastroesophageal reflux disease)    Hypertension    PONV (postoperative nausea and vomiting)    Past Surgical History:  Procedure Laterality Date   ABDOMINAL HYSTERECTOMY  01/1999   COLONOSCOPY WITH PROPOFOL N/A 06/22/2021   Procedure: COLONOSCOPY WITH PROPOFOL;  Surgeon: Lin Landsman, MD;  Location: [facility] ENDOSCOPY;  Service: Gastroenterology;  Laterality: N/A;   CYST EXCISION  1974   KNEE SURGERY Right 07/2010   TOTAL KNEE ARTHROPLASTY Right 10/12/2012   TOTAL KNEE ARTHROPLASTY WITH REVISION COMPONENTS Right 02/11/2016   Procedure: RIGHT TOTAL KNEE ARTHROPLASTY REVISION;  Surgeon: Gaynelle Arabian, MD;  Location: [facility];  Service: Orthopedics;  Laterality: Right;   TUBAL LIGATION  1984   Patient Active Problem List   Diagnosis Date Noted   Adenomatous polyp of ascending colon    Screening for colon cancer 05/05/2021   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 Wynelle Link)  L knee osteoarthritis  THERAPY DIAG:  Chronic pain of right knee  Chronic pain of left knee  Joint stiffness of both knees  Muscle weakness (generalized)  Gait difficulty  Rationale for Evaluation and Treatment: Rehabilitation  ONSET DATE: 2014  SUBJECTIVE:   SUBJECTIVE STATEMENT:  EVALUATON Pt. Reports chronic R knee issues and had TKA in 2014 and 2 revisions.  Pt. Had 1st revision by Dr. Rudene Christians and 2nd revision by Dr. Wynelle Link.  Increase B knee pain with walking/ yardwork.  Pt. Reports pain ranges from 6-10/10 B knee pain.  Pt. Reports no pain at rest.  Pt. Hoping to have L TKA in 2 years because she takes care of grandson at this time.  Pt. Has h/o L knee injections but not for past 1.5 years.  Pt. Retired from work and lives with husband.  Pt. Cares for mother (next door neighbor) and grandson.  L carpal tunnel syndrome and wearing brace.  R thumb pain in joint.     PERTINENT HISTORY: See MD notes.    PAIN:  Are you having pain? Yes: NPRS scale: 0/10 Pain location: R knee/ L knee Pain description: sharp/ shooting/ stabbing/ burning Aggravating factors: Increase activity Relieving factors: Ice/ Celebrex  PRECAUTIONS: None  WEIGHT BEARING RESTRICTIONS: No  FALLS:  Has patient fallen in  last 6 months? No  LIVING ENVIRONMENT: Lives with: lives with their family Lives in: House/apartment Stairs: Yes: External: 3-4 steps; on right going up Has following equipment at home: Single point cane, Walker - 2 wheeled, and Grab bars  OCCUPATION: Retired  PLOF: Independent  PATIENT GOALS: Improve B LE muscle strength/ decrease pain.    NEXT MD VISIT: 03/26/22  OBJECTIVE:   DIAGNOSTIC FINDINGS: See MD notes  PATIENT SURVEYS:  FOTO initial 43/ goal 53  COGNITION: Overall cognitive status: Within functional limits for tasks  assessed     SENSATION: WFL  EDEMA:  Circumferential: L knee joint (49 cm.)/ R knee joint (49 cm.).  10 cm inf. To patella for L mid-gastroc (42 cm.), R mid-gastroc (44 cm.).   MUSCLE LENGTH: Hamstrings: Right 86 deg; Left 86 deg Thomas test: TBD POSTURE: No Significant postural limitations, rounded shoulders, and forward head  PALPATION: No joint line tenderness.    LOWER EXTREMITY ROM:  Active ROM Right eval Left eval  Hip flexion 100 deg 102 deg.   Hip extension    Hip abduction [facility]  Hip adduction    Hip internal rotation    Hip external rotation    Knee flexion 95 deg. 94 deg.  Knee extension 0 deg.  -10 deg.  Ankle dorsiflexion    Ankle plantarflexion    Ankle inversion    Ankle eversion     (Blank rows = not tested)  LOWER EXTREMITY MMT:  MMT Right eval Left eval  Hip flexion 4-/5 4-/5  Hip extension    Hip abduction 4+/5 4/5  Hip adduction 4/5 4/5  Hip internal rotation 4/5 4/5  Hip external rotation 4/5 4/5  Knee flexion 5/5 (cramp) 5/5  Knee extension 4-/5 4/5  Ankle dorsiflexion    Ankle plantarflexion 4 4-  Ankle inversion    Ankle eversion     (Blank rows = not tested)  FUNCTIONAL TESTS:  - 5x sit to stand (2/15) - 13.44 sec.   GAIT: Distance walked: 60 ft. Assistive device utilized: None Level of assistance: Complete Independence Comments: Pt. Walked approx. 60 ft. In hallway and was cued to demonstrate her typical gait. Pt. Showed L antalgic gait showing that she favors her R leg over her left which is currently leading to compensation issues in her R knee. Pt. Has decreased stance time/step-length on her L LE compared to her R LE.  Limited to no arm swing until cued by PT.    2/29:  Plantar flexion MMT. R-13 reps before fatigue. L-6 reps before fatigue.   TODAY'S TREATMENT:                                                                                                                              DATE: 03/25/2022   Subjective: Pt.  Reports some LE muscle soreness yesterday and c/o 2/10 L knee pain with hip marching.      There Ex.:   -3#  ankle wts.: seated LAQ/ marching 20x each for quad strength/endurance.   -Standing hip ex. 4#: marching/ lateral walking/ hip extension/ heel raises/ hamstring curls 20x each.   -Step ups/ down on 6" step with light to no UE assist.  Added 4# ankle wts. For step touches.  Walking around PT with 4# ankle wts. With cuing to increase hip flexion/ step pattern (fatigue noted and slight increase in L knee pain).    -NuStep 10 min. L5. Seat 8. UE/LE.  Monitored vitals: O2 sat: 97%, HR: 104 bpm.  -Tandem/ SLS on L and R in //-bars.  Challenged with light UE assist for balance.    -Supine hamstring stretches/Piriformis/Hip Flex/ FABER stretches 2x30 seconds each side. Calf OP applied.   Moderate L hip ER/IR tightness   PATIENT EDUCATION:  Education details: Pt. Educated on issued HEP via demonstration, verbal cues, and handouts. Pt. Educated on her strength and mobility deficits found during PT intial EVAL. Pt. Educated on frequency and POC moving forward.    Person educated: Patient Education method: Explanation, Demonstration, and Handouts Education comprehension: verbalized understanding and returned demonstration  HOME EXERCISE PROGRAM: Access Code: [email or web address] URL: [email or web address] Date: 03/04/2022 Prepared by: Joaquin Music  Exercises - Gastroc Stretch on Wall  - 1 x daily - 7 x weekly - 2 sets - 60 hold - Seated Hamstring Stretch  - 1 x daily - 7 x weekly - 2 sets - 60 hold - Supine Quadriceps Stretch with Strap on Table  - 1 x daily - 7 x weekly - 2 sets - 60 hold - Standing Hip Abduction with Counter Support  - 1 x daily - 7 x weekly - 3 sets - 10 reps - Supine Hip Adduction Isometric with Ball  - 1 x daily - 7 x weekly - 3 sets - 10 reps  ASSESSMENT:  CLINICAL IMPRESSION: Pt. Works hard during tx. Session and focus on LE generalized strengthening/ normalized  gait.  Pt. Educated on performing HEP in a pain-free range to progressively increase LE strength/ROM. Pt. Demonstrates tightness in her hamstrings/hip musculature (L tigher than R) that PT addressed via supine stretching. Pt. Will remain active with caring for grandson today and going to park. Pt. Will benefit from skilled PT treatment to increase B knee strength/ROM to improve overall pain free function.   OBJECTIVE IMPAIRMENTS: Abnormal gait, decreased activity tolerance, decreased endurance, decreased mobility, difficulty walking, decreased ROM, decreased strength, hypomobility, and pain.   ACTIVITY LIMITATIONS: carrying, lifting, bending, squatting, sleeping, and stairs  PARTICIPATION LIMITATIONS: cleaning and yard work  PERSONAL FACTORS: Age, Past/current experiences, and 1-2 comorbidities: Previous TKA w/ revisions, COVID asthma  are also affecting patient's functional outcome.   REHAB POTENTIAL: Good  CLINICAL DECISION MAKING: Stable/uncomplicated  EVALUATION COMPLEXITY: Low  GOALS: Goals reviewed with patient? Yes  SHORT TERM GOALS: Target date: 03/16/2022 Pt. Will be independent with HEP to improve strength and decrease B knee pain to improve overall pain-free function.  Baseline: Just issued.  Goal status: Goal met  LONG TERM GOALS: Target date: 04/06/2022  Pt. Will increase FOTO to at least 55 to demonstrate increased function/endurance of B knees and increase overall activity level.  Baseline: 43.  3/14: 67 (marked improvement)- 10th visit Goal status: Goal met  2.  Pt. will be able to demonstrate a normalized gait pattern with low complaints of pain(<2/10 NPS) to avoid compensation of the R LE/Low back .  Baseline: See gait section.  Goal status: Partially met  3.  Pt. Will  increase LE strength a half muscle grade to improve B LE function and decrease load on joint to decrease B knee joint pain.  Baseline: 4-/5 Goal status: On-going  PLAN:  PT FREQUENCY:  2x/week  PT DURATION: 6 weeks  PLANNED INTERVENTIONS: Therapeutic exercises, Therapeutic activity, Neuromuscular re-education, Balance training, Gait training, Patient/Family education, Self Care, Joint mobilization, Joint manipulation, Stair training, Dry Needling, Cryotherapy, Moist heat, scar mobilization, Traction, and Manual therapy  PLAN FOR NEXT SESSION: Continue LE strengthening/endurance program.    Pura Spice, PT, DPT # [phone] 11:23 AM,03/25/22

## 2022-03-30 ENCOUNTER — Encounter: Payer: Self-pay | Admitting: Physical Therapy

## 2022-03-30 ENCOUNTER — Ambulatory Visit: Payer: Medicare HMO | Admitting: Physical Therapy

## 2022-03-30 DIAGNOSIS — M25562 Pain in left knee: Secondary | ICD-10-CM | POA: Diagnosis not present

## 2022-03-30 DIAGNOSIS — M6281 Muscle weakness (generalized): Secondary | ICD-10-CM | POA: Diagnosis not present

## 2022-03-30 DIAGNOSIS — M25662 Stiffness of left knee, not elsewhere classified: Secondary | ICD-10-CM | POA: Diagnosis not present

## 2022-03-30 DIAGNOSIS — G8929 Other chronic pain: Secondary | ICD-10-CM

## 2022-03-30 DIAGNOSIS — R269 Unspecified abnormalities of gait and mobility: Secondary | ICD-10-CM | POA: Diagnosis not present

## 2022-03-30 DIAGNOSIS — M25661 Stiffness of right knee, not elsewhere classified: Secondary | ICD-10-CM | POA: Diagnosis not present

## 2022-03-30 DIAGNOSIS — M25561 Pain in right knee: Secondary | ICD-10-CM | POA: Diagnosis not present

## 2022-03-30 NOTE — Therapy (Signed)
OUTPATIENT PHYSICAL THERAPY LOWER EXTREMITY TREATMENT  Patient Name: Leslie Koch MRN: [id number] DOB:[birth year], 67 y.o., female Today's Date: 03/30/2022  END OF SESSION:  PT End of Session - 03/30/22 0946     Visit Number 11    Number of Visits 13    Date for PT Re-Evaluation 04/06/22    PT Start Time 0946    Activity Tolerance Patient tolerated treatment well;No increased pain    Behavior During Therapy [facility] for tasks assessed/performed            0946 to 1032 (46 minutes).    Past Medical History:  Diagnosis Date   Arthritis    GERD (gastroesophageal reflux disease)    Hypertension    PONV (postoperative nausea and vomiting)    Past Surgical History:  Procedure Laterality Date   ABDOMINAL HYSTERECTOMY  01/1999   COLONOSCOPY WITH PROPOFOL N/A 06/22/2021   Procedure: COLONOSCOPY WITH PROPOFOL;  Surgeon: Lin Landsman, MD;  Location: [facility] ENDOSCOPY;  Service: Gastroenterology;  Laterality: N/A;   CYST EXCISION  1974   KNEE SURGERY Right 07/2010   TOTAL KNEE ARTHROPLASTY Right 10/12/2012   TOTAL KNEE ARTHROPLASTY WITH REVISION COMPONENTS Right 02/11/2016   Procedure: RIGHT TOTAL KNEE ARTHROPLASTY REVISION;  Surgeon: Gaynelle Arabian, MD;  Location: [facility];  Service: Orthopedics;  Laterality: Right;   TUBAL LIGATION  1984   Patient Active Problem List   Diagnosis Date Noted   Adenomatous polyp of ascending colon    Screening for colon cancer 05/05/2021   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  Wynelle Link)  L knee osteoarthritis  THERAPY DIAG:  Chronic pain of right knee  Chronic pain of left knee  Joint stiffness of both knees  Muscle weakness (generalized)  Gait difficulty  Rationale for Evaluation and Treatment: Rehabilitation  ONSET DATE: 2014  SUBJECTIVE:   SUBJECTIVE STATEMENT:  EVALUATON Pt. Reports chronic R knee issues and had TKA in 2014 and 2 revisions.  Pt. Had 1st revision by Dr. Rudene Christians and 2nd revision by Dr. Wynelle Link.  Increase B knee pain with walking/ yardwork.  Pt. Reports pain ranges from 6-10/10 B knee pain.  Pt. Reports no pain at rest.  Pt. Hoping to have L TKA in 2 years because she takes care of grandson at this time.  Pt. Has h/o L knee injections but not for past 1.5 years.  Pt. Retired from work and lives with husband.  Pt. Cares for mother (next door neighbor) and grandson.  L carpal tunnel syndrome and wearing brace.  R thumb pain in joint.     PERTINENT HISTORY: See MD notes.    PAIN:  Are you having pain? Yes: NPRS scale: 0/10 Pain location: R knee/ L knee Pain description: sharp/ shooting/ stabbing/ burning Aggravating factors: Increase activity Relieving factors: Ice/ Celebrex  PRECAUTIONS: None  WEIGHT BEARING RESTRICTIONS: No  FALLS:  Has patient fallen in last 6 months? No  LIVING ENVIRONMENT: Lives with: lives with their family Lives in: House/apartment Stairs: Yes: External: 3-4 steps; on right going up  Has following equipment at home: Single point cane, Walker - 2 wheeled, and Grab bars  OCCUPATION: Retired  PLOF: Independent  PATIENT GOALS: Improve B LE muscle strength/ decrease pain.    NEXT MD VISIT: 03/26/22  OBJECTIVE:   DIAGNOSTIC FINDINGS: See MD notes  PATIENT SURVEYS:  FOTO initial 43/ goal 2  COGNITION: Overall cognitive status: Within functional limits for tasks assessed     SENSATION: WFL  EDEMA:  Circumferential: L knee joint (49 cm.)/ R knee joint (49 cm.).  10 cm inf. To patella for L  mid-gastroc (42 cm.), R mid-gastroc (44 cm.).   MUSCLE LENGTH: Hamstrings: Right 86 deg; Left 86 deg Thomas test: TBD POSTURE: No Significant postural limitations, rounded shoulders, and forward head  PALPATION: No joint line tenderness.    LOWER EXTREMITY ROM:  Active ROM Right eval Left eval  Hip flexion 100 deg 102 deg.   Hip extension    Hip abduction [facility]  Hip adduction    Hip internal rotation    Hip external rotation    Knee flexion 95 deg. 94 deg.  Knee extension 0 deg.  -10 deg.  Ankle dorsiflexion    Ankle plantarflexion    Ankle inversion    Ankle eversion     (Blank rows = not tested)  LOWER EXTREMITY MMT:  MMT Right eval Left eval  Hip flexion 4-/5 4-/5  Hip extension    Hip abduction 4+/5 4/5  Hip adduction 4/5 4/5  Hip internal rotation 4/5 4/5  Hip external rotation 4/5 4/5  Knee flexion 5/5 (cramp) 5/5  Knee extension 4-/5 4/5  Ankle dorsiflexion    Ankle plantarflexion 4 4-  Ankle inversion    Ankle eversion     (Blank rows = not tested)  FUNCTIONAL TESTS:  - 5x sit to stand (2/15) - 13.44 sec.   GAIT: Distance walked: 60 ft. Assistive device utilized: None Level of assistance: Complete Independence Comments: Pt. Walked approx. 60 ft. In hallway and was cued to demonstrate her typical gait. Pt. Showed L antalgic gait showing that she favors her R leg over her left which is currently leading to compensation issues in her R knee. Pt. Has decreased stance time/step-length on her L LE compared to her R LE.  Limited to no arm swing until cued by PT.    2/29:  Plantar flexion MMT. R-13 reps before fatigue. L-6 reps before fatigue.   TODAY'S TREATMENT:                                                                                                                              DATE: 03/30/2022   Subjective: Pt. Reports no knee pain prior to tx. Session but no c/o 2/10 L knee pain with hip marching.      There Ex.:   -NuStep 10 min. L5. Seat  8. UE/LE.    -Walking partial lunges in //-bars with light UE assist and cuing for upright  posture/ avoid knee pain.     -Standing hip ex. 4#: marching/ lateral walking/ hip extension/ heel raises/ hamstring curls 20x each.   -Step touches (4#) and recip. Step ups on 6" step with light to no UE assist.  Walking around PT with 4# ankle wts. With cuing to increase hip flexion/ step pattern (fatigue noted and slight increase in L knee pain).      -Standing hamstring/ gastroc stretches at stairs 3x each.    -Supine piriformis/Hip Flex/ FABER/ hip IR and ER stretches 2x30 seconds each side. Calf OP applied.     PATIENT EDUCATION:  Education details: Pt. Educated on issued HEP via demonstration, verbal cues, and handouts. Pt. Educated on her strength and mobility deficits found during PT intial EVAL. Pt. Educated on frequency and POC moving forward.    Person educated: Patient Education method: Explanation, Demonstration, and Handouts Education comprehension: verbalized understanding and returned demonstration  HOME EXERCISE PROGRAM: Access Code: [email or web address] URL: [email or web address] Date: 03/04/2022 Prepared by: Joaquin Music  Exercises - Gastroc Stretch on Wall  - 1 x daily - 7 x weekly - 2 sets - 60 hold - Seated Hamstring Stretch  - 1 x daily - 7 x weekly - 2 sets - 60 hold - Supine Quadriceps Stretch with Strap on Table  - 1 x daily - 7 x weekly - 2 sets - 60 hold - Standing Hip Abduction with Counter Support  - 1 x daily - 7 x weekly - 3 sets - 10 reps - Supine Hip Adduction Isometric with Ball  - 1 x daily - 7 x weekly - 3 sets - 10 reps  ASSESSMENT:  CLINICAL IMPRESSION: Pt. Works hard during tx. Session and focus on LE generalized strengthening/ normalized gait.  Pt. Educated on performing HEP in a pain-free range to progressively increase LE strength/ROM. Pt. Demonstrates tightness in her hip musculature (L tigher than R) that PT addressed via supine stretching. Pt.  Will remain active with caring for grandson today and going to park. Pt. Will benefit from skilled PT treatment to increase B knee strength/ROM to improve overall pain free function.   OBJECTIVE IMPAIRMENTS: Abnormal gait, decreased activity tolerance, decreased endurance, decreased mobility, difficulty walking, decreased ROM, decreased strength, hypomobility, and pain.   ACTIVITY LIMITATIONS: carrying, lifting, bending, squatting, sleeping, and stairs  PARTICIPATION LIMITATIONS: cleaning and yard work  PERSONAL FACTORS: Age, Past/current experiences, and 1-2 comorbidities: Previous TKA w/ revisions, COVID asthma  are also affecting patient's functional outcome.   REHAB POTENTIAL: Good  CLINICAL DECISION MAKING: Stable/uncomplicated  EVALUATION COMPLEXITY: Low  GOALS: Goals reviewed with patient? Yes  SHORT TERM GOALS: Target date: 03/16/2022 Pt. Will be independent with HEP to improve strength and decrease B knee pain to improve overall pain-free function.  Baseline: Just issued.  Goal status: Goal met  LONG TERM GOALS: Target date: 04/06/2022  Pt. Will increase FOTO to at least 55 to demonstrate increased function/endurance of B knees and increase overall activity level.  Baseline: 43.  3/14: 67 (marked improvement)- 10th visit Goal status: Goal met  2.  Pt. will be able to demonstrate a normalized gait pattern with low complaints of pain(<2/10 NPS) to avoid compensation of the R LE/Low back .  Baseline: See gait section.  Goal status: Partially met  3.  Pt. Will increase LE strength a half muscle grade to improve B LE function and decrease load on joint to decrease B knee joint pain.  Baseline: 4-/5 Goal status: On-going  PLAN:  PT  FREQUENCY: 2x/week  PT DURATION: 6 weeks  PLANNED INTERVENTIONS: Therapeutic exercises, Therapeutic activity, Neuromuscular re-education, Balance training, Gait training, Patient/Family education, Self Care, Joint mobilization, Joint  manipulation, Stair training, Dry Needling, Cryotherapy, Moist heat, scar mobilization, Traction, and Manual therapy  PLAN FOR NEXT SESSION: Continue LE strengthening/endurance program.    Pura Spice, PT, DPT # [phone] 9:46 AM,03/30/22

## 2022-04-01 ENCOUNTER — Ambulatory Visit: Payer: Medicare HMO | Admitting: Physical Therapy

## 2022-04-01 DIAGNOSIS — M25661 Stiffness of right knee, not elsewhere classified: Secondary | ICD-10-CM | POA: Diagnosis not present

## 2022-04-01 DIAGNOSIS — M25662 Stiffness of left knee, not elsewhere classified: Secondary | ICD-10-CM | POA: Diagnosis not present

## 2022-04-01 DIAGNOSIS — M25562 Pain in left knee: Secondary | ICD-10-CM | POA: Diagnosis not present

## 2022-04-01 DIAGNOSIS — M6281 Muscle weakness (generalized): Secondary | ICD-10-CM | POA: Diagnosis not present

## 2022-04-01 DIAGNOSIS — R269 Unspecified abnormalities of gait and mobility: Secondary | ICD-10-CM

## 2022-04-01 DIAGNOSIS — M25561 Pain in right knee: Secondary | ICD-10-CM | POA: Diagnosis not present

## 2022-04-01 DIAGNOSIS — G8929 Other chronic pain: Secondary | ICD-10-CM

## 2022-04-03 ENCOUNTER — Encounter: Payer: Self-pay | Admitting: Physical Therapy

## 2022-04-03 NOTE — Therapy (Signed)
OUTPATIENT PHYSICAL THERAPY LOWER EXTREMITY TREATMENT  Patient Name: Leslie Koch MRN: [id number] DOB:08-14-1955, 67 y.o., female Today's Date: 04/03/2022  END OF SESSION:  PT End of Session - 04/03/22 1955     Visit Number 12    Number of Visits 13    Date for PT Re-Evaluation 04/06/22    PT Start Time 0942    PT Stop Time 1033    PT Time Calculation (min) 51 min    Activity Tolerance Patient tolerated treatment well;No increased pain    Behavior During Therapy WFL for tasks assessed/performed            Past Medical History:  Diagnosis Date   Arthritis    GERD (gastroesophageal reflux disease)    Hypertension    PONV (postoperative nausea and vomiting)    Past Surgical History:  Procedure Laterality Date   ABDOMINAL HYSTERECTOMY  01/1999   COLONOSCOPY WITH PROPOFOL N/A 06/22/2021   Procedure: COLONOSCOPY WITH PROPOFOL;  Surgeon: Lin Landsman, MD;  Location: [facility];  Service: Gastroenterology;  Laterality: N/A;   CYST EXCISION  1974   KNEE SURGERY Right 07/2010   TOTAL KNEE ARTHROPLASTY Right 10/12/2012   TOTAL KNEE ARTHROPLASTY WITH REVISION COMPONENTS Right 02/11/2016   Procedure: RIGHT TOTAL KNEE ARTHROPLASTY REVISION;  Surgeon: Gaynelle Arabian, MD;  Location: [facility];  Service: Orthopedics;  Laterality: Right;   TUBAL LIGATION  1984   Patient Active Problem List   Diagnosis Date Noted   Adenomatous polyp of ascending colon    Screening for colon cancer 05/05/2021   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 Wynelle Link)  L knee osteoarthritis  THERAPY DIAG:  Chronic pain of right knee  Chronic pain of left knee  Joint stiffness of both knees  Muscle weakness (generalized)  Gait difficulty  Rationale for Evaluation and Treatment: Rehabilitation  ONSET DATE: 2014  SUBJECTIVE:   SUBJECTIVE STATEMENT:  EVALUATON Pt. Reports chronic R knee issues and had TKA in 2014 and 2 revisions.  Pt. Had 1st revision by Dr. Rudene Christians and 2nd revision by Dr. Wynelle Link.  Increase B knee pain with walking/ yardwork.  Pt. Reports pain ranges from 6-10/10 B knee pain.  Pt. Reports no pain at rest.  Pt. Hoping to have L TKA in 2 years because she takes care of grandson at this time.  Pt. Has h/o L knee injections but not for past 1.5 years.  Pt. Retired from work and lives with husband.  Pt. Cares for mother (next door neighbor) and grandson.  L carpal tunnel syndrome and wearing brace.  R thumb pain in joint.     PERTINENT HISTORY: See MD notes.    PAIN:  Are you having pain? Yes: NPRS scale: 0/10 Pain location: R knee/ L knee Pain description: sharp/ shooting/ stabbing/ burning Aggravating factors: Increase activity Relieving factors: Ice/ Celebrex  PRECAUTIONS: None  WEIGHT BEARING RESTRICTIONS: No  FALLS:  Has patient fallen in last 6 months? No  LIVING ENVIRONMENT: Lives with: lives with their family Lives in: House/apartment Stairs:  Yes: External: 3-4 steps; on right going up Has following equipment at home: Single point cane, Walker - 2 wheeled, and Grab bars  OCCUPATION: Retired  PLOF: Independent  PATIENT GOALS: Improve B LE muscle strength/ decrease pain.    NEXT MD VISIT: 03/26/22  OBJECTIVE:   DIAGNOSTIC FINDINGS: See MD notes  PATIENT SURVEYS:  FOTO initial 43/ goal 41  COGNITION: Overall cognitive status: Within functional limits for tasks assessed     SENSATION: WFL  EDEMA:  Circumferential: L knee joint (49 cm.)/ R knee joint (49 cm.).  10 cm inf. To  patella for L mid-gastroc (42 cm.), R mid-gastroc (44 cm.).   MUSCLE LENGTH: Hamstrings: Right 86 deg; Left 86 deg Thomas test: TBD POSTURE: No Significant postural limitations, rounded shoulders, and forward head  PALPATION: No joint line tenderness.    LOWER EXTREMITY ROM:  Active ROM Right eval Left eval  Hip flexion 100 deg 102 deg.   Hip extension    Hip abduction [facility]  Hip adduction    Hip internal rotation    Hip external rotation    Knee flexion 95 deg. 94 deg.  Knee extension 0 deg.  -10 deg.  Ankle dorsiflexion    Ankle plantarflexion    Ankle inversion    Ankle eversion     (Blank rows = not tested)  LOWER EXTREMITY MMT:  MMT Right eval Left eval  Hip flexion 4-/5 4-/5  Hip extension    Hip abduction 4+/5 4/5  Hip adduction 4/5 4/5  Hip internal rotation 4/5 4/5  Hip external rotation 4/5 4/5  Knee flexion 5/5 (cramp) 5/5  Knee extension 4-/5 4/5  Ankle dorsiflexion    Ankle plantarflexion 4 4-  Ankle inversion    Ankle eversion     (Blank rows = not tested)  FUNCTIONAL TESTS:  - 5x sit to stand (2/15) - 13.44 sec.   GAIT: Distance walked: 60 ft. Assistive device utilized: None Level of assistance: Complete Independence Comments: Pt. Walked approx. 60 ft. In hallway and was cued to demonstrate her typical gait. Pt. Showed L antalgic gait showing that she favors her R leg over her left which is currently leading to compensation issues in her R knee. Pt. Has decreased stance time/step-length on her L LE compared to her R LE.  Limited to no arm swing until cued by PT.    2/29:  Plantar flexion MMT. R-13 reps before fatigue. L-6 reps before fatigue.   TODAY'S TREATMENT:                                                                                                                              DATE: 04/01/2022   Subjective: Pt. Reports no knee pain at rest.  Pt. C/o R lateral knee pain/ L medial knee pain with increase walking/ activity.  Pt.  States she is having a slight increase in knee discomfort this week with no reason given.   There  Ex.:   -NuStep 10 min. L5. Seat 8. UE/LE.    -Walking lunges in //-bars/ 6" step ups and down with eccentric quad control.  Light UE assist as needed.   -Standing hip ex. 4#: marching/ lateral walking/ hip extension/ heel raises/ hamstring curls 20x each.   -Walking around PT clinic with 4# ankle wts. With cuing to increase hip flexion/ step pattern (fatigue noted and slight increase in L knee pain).      -Standing hamstring/ gastroc stretches at stairs 3x each.    -Supine piriformis/Hip Flex/ FABER/ hip IR and ER stretches 2x30 seconds each side. Calf OP applied.     Discussed HEP/ household activities/ playing with grandson at park.   PATIENT EDUCATION:  Education details: Pt. Educated on issued HEP via demonstration, verbal cues, and handouts. Pt. Educated on her strength and mobility deficits found during PT intial EVAL. Pt. Educated on frequency and POC moving forward.    Person educated: Patient Education method: Explanation, Demonstration, and Handouts Education comprehension: verbalized understanding and returned demonstration  HOME EXERCISE PROGRAM: Access Code: [email or web address] URL: [email or web address] Date: 03/04/2022 Prepared by: Joaquin Music  Exercises - Gastroc Stretch on Wall  - 1 x daily - 7 x weekly - 2 sets - 60 hold - Seated Hamstring Stretch  - 1 x daily - 7 x weekly - 2 sets - 60 hold - Supine Quadriceps Stretch with Strap on Table  - 1 x daily - 7 x weekly - 2 sets - 60 hold - Standing Hip Abduction with Counter Support  - 1 x daily - 7 x weekly - 3 sets - 10 reps - Supine Hip Adduction Isometric with Ball  - 1 x daily - 7 x weekly - 3 sets - 10 reps  ASSESSMENT:  CLINICAL IMPRESSION: Pt. Works hard during tx. Session and focus on LE generalized strengthening/ normalized gait.  Pt. Educated on performing HEP in a pain-free range to progressively  increase LE strength/ROM. Pt. Demonstrates tightness in her hip musculature (L tigher than R) that PT addressed via supine stretching. Pt. Will remain active with caring for grandson today and going to park. Pt. Will benefit from skilled PT treatment to increase B knee strength/ROM to improve overall pain free function.   OBJECTIVE IMPAIRMENTS: Abnormal gait, decreased activity tolerance, decreased endurance, decreased mobility, difficulty walking, decreased ROM, decreased strength, hypomobility, and pain.   ACTIVITY LIMITATIONS: carrying, lifting, bending, squatting, sleeping, and stairs  PARTICIPATION LIMITATIONS: cleaning and yard work  PERSONAL FACTORS: Age, Past/current experiences, and 1-2 comorbidities: Previous TKA w/ revisions, COVID asthma  are also affecting patient's functional outcome.   REHAB POTENTIAL: Good  CLINICAL DECISION MAKING: Stable/uncomplicated  EVALUATION COMPLEXITY: Low  GOALS: Goals reviewed with patient? Yes  SHORT TERM GOALS: Target date: 03/16/2022 Pt. Will be independent with HEP to improve strength and decrease B knee pain to improve overall pain-free function.  Baseline: Just issued.  Goal status: Goal met  LONG TERM GOALS: Target date: 04/06/2022  Pt. Will increase FOTO to at least 55 to demonstrate increased function/endurance of B knees and increase overall activity level.  Baseline: 43.  3/14: 68 (marked improvement)- 10th visit Goal status: Goal met  2.  Pt. will be able to demonstrate a normalized gait pattern with low complaints of pain(<2/10 NPS) to avoid compensation of the R LE/Low back .  Baseline: See gait section.  Goal status: Partially met  3.  Pt. Will increase LE strength a half muscle grade to improve B LE function  and decrease load on joint to decrease B knee joint pain.  Baseline: 4-/5 Goal status: On-going  PLAN:  PT FREQUENCY: 2x/week  PT DURATION: 6 weeks  PLANNED INTERVENTIONS: Therapeutic exercises, Therapeutic  activity, Neuromuscular re-education, Balance training, Gait training, Patient/Family education, Self Care, Joint mobilization, Joint manipulation, Stair training, Dry Needling, Cryotherapy, Moist heat, scar mobilization, Traction, and Manual therapy  PLAN FOR NEXT SESSION: Continue LE strengthening/endurance program.  CHECK GOALS/ schedule   Pura Spice, PT, DPT # [id number] 7:57 PM,04/03/22

## 2022-04-06 ENCOUNTER — Ambulatory Visit: Payer: Medicare HMO | Admitting: Physical Therapy

## 2022-04-08 ENCOUNTER — Ambulatory Visit: Payer: Medicare HMO | Admitting: Physical Therapy

## 2022-04-13 ENCOUNTER — Encounter: Payer: Medicare HMO | Admitting: Physical Therapy

## 2022-04-13 DIAGNOSIS — M1712 Unilateral primary osteoarthritis, left knee: Secondary | ICD-10-CM | POA: Diagnosis not present

## 2022-04-20 DIAGNOSIS — M1712 Unilateral primary osteoarthritis, left knee: Secondary | ICD-10-CM | POA: Diagnosis not present

## 2022-04-26 ENCOUNTER — Ambulatory Visit: Payer: Medicare HMO | Admitting: Dermatology

## 2022-04-26 VITALS — BP 132/77

## 2022-04-26 DIAGNOSIS — L814 Other melanin hyperpigmentation: Secondary | ICD-10-CM | POA: Diagnosis not present

## 2022-04-26 DIAGNOSIS — L821 Other seborrheic keratosis: Secondary | ICD-10-CM | POA: Diagnosis not present

## 2022-04-26 DIAGNOSIS — L820 Inflamed seborrheic keratosis: Secondary | ICD-10-CM | POA: Diagnosis not present

## 2022-04-26 NOTE — Progress Notes (Signed)
   Follow-Up Visit   Subjective  Leslie Koch is a 67 y.o. female who presents for the following: ISKs on face, trunk, legs, arms itchy   The following portions of the chart were reviewed this encounter and updated as appropriate: medications, allergies, medical history  Review of Systems:  No other skin or systemic complaints except as noted in HPI or Assessment and Plan.  Objective  Well appearing patient in no apparent distress; mood and affect are within normal limits.  A focused examination was performed of the following areas: Face, trunk, legs, arms  Relevant exam findings are noted in the Assessment and Plan.    Assessment & Plan   INFLAMED SEBORRHEIC KERATOSIS Exam: Erythematous keratotic or waxy stuck-on papule or plaque.  Symptomatic, irritating, patient would like treated.  Benign-appearing.  Call clinic for new or changing lesions.   Prior to procedure, discussed risks of blister formation, small wound, skin dyspigmentation, or rare scar following treatment. Recommend Vaseline ointment to treated areas while healing.  Destruction Procedure Note Destruction method: cryotherapy   Informed consent: discussed and consent obtained   Lesion destroyed using liquid nitrogen: Yes   Outcome: patient tolerated procedure well with no complications   Post-procedure details: wound care instructions given   Locations: mid lower back x 5, R mid upper back x 1, R post shoulder x 1, L ant shoulder x 1, R chest x 1, R neck x 1, L mid jaw x 1, L temporal hairline x 2, R upper forehead x 1, R lat eyebrow x 1 # of Lesions Treated: 15  SEBORRHEIC KERATOSIS - Stuck-on, waxy, tan-brown papules and/or plaques  - Benign-appearing - Discussed benign etiology and prognosis. - Observe - Call for any changes - trunk  LENTIGINES Exam: scattered tan macules Due to sun exposure Treatment Plan: Benign-appearing, observe. Recommend daily broad spectrum sunscreen SPF 30+ to sun-exposed  areas, reapply every 2 hours as needed.  Call for any changes  - back  Return if symptoms worsen or fail to improve.  I, Ardis Rowan, RMA, am acting as scribe for Willeen Niece, MD .   Documentation: I have reviewed the above documentation for accuracy and completeness, and I agree with the above.  Willeen Niece, MD

## 2022-04-26 NOTE — Patient Instructions (Addendum)
Cryotherapy Aftercare  Wash gently with soap and water everyday.   Apply Vaseline and Band-Aid daily until healed.     Due to recent changes in healthcare laws, you may see results of your pathology and/or laboratory studies on MyChart before the doctors have had a chance to review them. We understand that in some cases there may be results that are confusing or concerning to you. Please understand that not all results are received at the same time and often the doctors may need to interpret multiple results in order to provide you with the best plan of care or course of treatment. Therefore, we ask that you please give us 2 business days to thoroughly review all your results before contacting the office for clarification. Should we see a critical lab result, you will be contacted sooner.   If You Need Anything After Your Visit  If you have any questions or concerns for your doctor, please call our main line at [phone] and press option 4 to reach your doctor's medical assistant. If no one answers, please leave a voicemail as directed and we will return your call as soon as possible. Messages left after 4 pm will be answered the following business day.   You may also send us a message via MyChart. We typically respond to MyChart messages within 1-2 business days.  For prescription refills, please ask your pharmacy to contact our office. Our fax number is [phone].  If you have an urgent issue when the clinic is closed that cannot wait until the next business day, you can page your doctor at the number below.    Please note that while we do our best to be available for urgent issues outside of office hours, we are not available 24/7.   If you have an urgent issue and are unable to reach us, you may choose to seek medical care at your doctor's office, retail clinic, urgent care center, or emergency room.  If you have a medical emergency, please immediately call 911 or go to the  emergency department.  Pager Numbers  - Dr. Kowalski: [phone]  - Dr. Moye: [phone]  - Dr. Stewart: [phone]  In the event of inclement weather, please call our main line at [phone] for an update on the status of any delays or closures.  Dermatology Medication Tips: Please keep the boxes that topical medications come in in order to help keep track of the instructions about where and how to use these. Pharmacies typically print the medication instructions only on the boxes and not directly on the medication tubes.   If your medication is too expensive, please contact our office at [phone] option 4 or send us a message through MyChart.   We are unable to tell what your co-pay for medications will be in advance as this is different depending on your insurance coverage. However, we may be able to find a substitute medication at lower cost or fill out paperwork to get insurance to cover a needed medication.   If a prior authorization is required to get your medication covered by your insurance company, please allow us 1-2 business days to complete this process.  Drug prices often vary depending on where the prescription is filled and some pharmacies may offer cheaper prices.  The website www.goodrx.com contains coupons for medications through different pharmacies. The prices here do not account for what the cost may be with help from insurance (it may be cheaper with your insurance), but the website can   give you the price if you did not use any insurance.  - You can print the associated coupon and take it with your prescription to the pharmacy.  - You may also stop by our office during regular business hours and pick up a GoodRx coupon card.  - If you need your prescription sent electronically to a different pharmacy, notify our office through [facility] MyChart or by phone at [phone] option 4.     Si Usted Necesita Algo Despus de Su Visita  Tambin puede  enviarnos un mensaje a travs de MyChart. Por lo general respondemos a los mensajes de MyChart en el transcurso de 1 a 2 das hbiles.  Para renovar recetas, por favor pida a su farmacia que se ponga en contacto con nuestra oficina. Nuestro nmero de fax es el [phone].  Si tiene un asunto urgente cuando la clnica est cerrada y que no puede esperar hasta el siguiente da hbil, puede llamar/localizar a su doctor(a) al nmero que aparece a continuacin.   Por favor, tenga en cuenta que aunque hacemos todo lo posible para estar disponibles para asuntos urgentes fuera del horario de oficina, no estamos disponibles las 24 horas del da, los 7 das de la semana.   Si tiene un problema urgente y no puede comunicarse con nosotros, puede optar por buscar atencin mdica  en el consultorio de su doctor(a), en una clnica privada, en un centro de atencin urgente o en una sala de emergencias.  Si tiene una emergencia mdica, por favor llame inmediatamente al 911 o vaya a la sala de emergencias.  Nmeros de bper  - Dr. Kowalski: [phone]  - Dra. Moye: [phone]  - Dra. Stewart: [phone]  En caso de inclemencias del tiempo, por favor llame a nuestra lnea principal al [phone] para una actualizacin sobre el estado de cualquier retraso o cierre.  Consejos para la medicacin en dermatologa: Por favor, guarde las cajas en las que vienen los medicamentos de uso tpico para ayudarle a seguir las instrucciones sobre dnde y cmo usarlos. Las farmacias generalmente imprimen las instrucciones del medicamento slo en las cajas y no directamente en los tubos del medicamento.   Si su medicamento es muy caro, por favor, pngase en contacto con nuestra oficina llamando al [phone] y presione la opcin 4 o envenos un mensaje a travs de MyChart.   No podemos decirle cul ser su copago por los medicamentos por adelantado ya que esto es diferente dependiendo de la cobertura de su seguro.  Sin embargo, es posible que podamos encontrar un medicamento sustituto a menor costo o llenar un formulario para que el seguro cubra el medicamento que se considera necesario.   Si se requiere una autorizacin previa para que su compaa de seguros cubra su medicamento, por favor permtanos de 1 a 2 das hbiles para completar este proceso.  Los precios de los medicamentos varan con frecuencia dependiendo del lugar de dnde se surte la receta y alguna farmacias pueden ofrecer precios ms baratos.  El sitio web www.goodrx.com tiene cupones para medicamentos de diferentes farmacias. Los precios aqu no tienen en cuenta lo que podra costar con la ayuda del seguro (puede ser ms barato con su seguro), pero el sitio web puede darle el precio si no utiliz ningn seguro.  - Puede imprimir el cupn correspondiente y llevarlo con su receta a la farmacia.  - Tambin puede pasar por nuestra oficina durante el horario de atencin regular y recoger una tarjeta de cupones de GoodRx.  -   Si necesita que su receta se enve electrnicamente a una farmacia diferente, informe a nuestra oficina a travs de MyChart de [facility] o por telfono llamando al [phone] y presione la opcin 4.  

## 2022-04-27 ENCOUNTER — Other Ambulatory Visit: Payer: Self-pay | Admitting: Family Medicine

## 2022-04-27 DIAGNOSIS — M2550 Pain in unspecified joint: Secondary | ICD-10-CM

## 2022-04-27 DIAGNOSIS — M1712 Unilateral primary osteoarthritis, left knee: Secondary | ICD-10-CM | POA: Diagnosis not present

## 2022-04-27 DIAGNOSIS — R232 Flushing: Secondary | ICD-10-CM

## 2022-04-28 NOTE — Telephone Encounter (Signed)
Requested Prescriptions  Pending Prescriptions Disp Refills   celecoxib (CELEBREX) 200 MG capsule [Pharmacy Med Name: CELECOXIB 200 MG CAPSULE] 90 capsule 0    Sig: TAKE 1 CAPSULE BY MOUTH EVERY DAY     Analgesics:  COX2 Inhibitors Failed - 04/27/2022  2:51 AM      Failed - Manual Review: Labs are only required if the patient has taken medication for more than 8 weeks.      Failed - AST in normal range and within 360 days    AST  Date Value Ref Range Status  03/03/2020 15 0 - 40 IU/L Final         Failed - ALT in normal range and within 360 days    ALT  Date Value Ref Range Status  03/03/2020 10 0 - 32 IU/L Final         Passed - HGB in normal range and within 360 days    Hemoglobin  Date Value Ref Range Status  11/24/2021 14.3 12.0 - 15.0 g/dL Final  11/91/4782 95.6 11.1 - 15.9 g/dL Final         Passed - Cr in normal range and within 360 days    Creatinine  Date Value Ref Range Status  11/28/2013 0.73 0.60 - 1.30 mg/dL Final   Creatinine, Ser  Date Value Ref Range Status  11/24/2021 0.71 0.44 - 1.00 mg/dL Final         Passed - HCT in normal range and within 360 days    HCT  Date Value Ref Range Status  11/24/2021 41.9 36.0 - 46.0 % Final   Hematocrit  Date Value Ref Range Status  03/03/2020 41.9 34.0 - 46.6 % Final         Passed - eGFR is 30 or above and within 360 days    EGFR (African American)  Date Value Ref Range Status  11/28/2013 >60 >62mL/min Final  10/13/2012 >60  Final   GFR calc Af Amer  Date Value Ref Range Status  03/03/2020 107 >59 mL/min/1.73 Final    Comment:    **In accordance with recommendations from the NKF-ASN Task force,**   [facility] is in the process of updating its eGFR calculation to the   2021 CKD-EPI creatinine equation that estimates kidney function   without a race variable.    EGFR (Non-African Amer.)  Date Value Ref Range Status  11/28/2013 >60 >72mL/min Final    Comment:    eGFR values <40mL/min/1.73 m2 may be an  indication of chronic kidney disease (CKD). Calculated eGFR, using the MRDR Study equation, is useful in  patients with stable renal function. The eGFR calculation will not be reliable in acutely ill patients when serum creatinine is changing rapidly. It is not useful in patients on dialysis. The eGFR calculation may not be applicable to patients at the low and high extremes of body sizes, pregnant women, and vegetarians.   10/13/2012 >60  Final    Comment:    eGFR values <43mL/min/1.73 m2 may be an indication of chronic kidney disease (CKD). Calculated eGFR is useful in patients with stable renal function. The eGFR calculation will not be reliable in acutely ill patients when serum creatinine is changing rapidly. It is not useful in  patients on dialysis. The eGFR calculation may not be applicable to patients at the low and high extremes of body sizes, pregnant women, and vegetarians.    GFR, Estimated  Date Value Ref Range Status  11/24/2021 >60 >60 mL/min Final  Comment:    (NOTE) Calculated using the CKD-EPI Creatinine Equation (2021)    eGFR  Date Value Ref Range Status  09/01/2020 97 >59 mL/min/1.73 Final         Passed - Patient is not pregnant      Passed - Valid encounter within last 12 months    Recent Outpatient Visits           4 months ago Primary hypertension   [facility] Caro Laroche, DO   11 months ago Welcome to [employer] preventive visit   [facility] Merita Norton T, FNP   1 year ago Essential hypertension   [place] [facility] [place], Marzella Schlein, MD   2 years ago Primary hypertension   [place] [facility] [place], Victorino Dike M, [place]   3 years ago Pneumonia due to COVID-19 virus   [facility] [place], Alessandra Bevels, [place]       Future Appointments             In 1 week Jacky Kindle, FNP [facility], [facility]             estradiol (ESTRACE) 1 MG tablet [Pharmacy Med Name: ESTRADIOL 1 MG TABLET] 180 tablet 0    Sig: TAKE 2 TABLETS BY MOUTH EVERY DAY     OB/GYN:  Estrogens Passed - 04/27/2022  2:51 AM      Passed - Mammogram is up-to-date per Health Maintenance      Passed - Last BP in normal range    BP Readings from Last 1 Encounters:  04/26/22 132/77         Passed - Valid encounter within last 12 months    Recent Outpatient Visits           4 months ago Primary hypertension   [facility] Caro Laroche, DO   11 months ago Welcome to [employer] preventive visit   [facility] Merita Norton T, FNP   1 year ago Essential hypertension   [place] [place], Marzella Schlein, MD   2 years ago Primary hypertension    [facility] [place], Victorino Dike [place], [place]   3 years ago Pneumonia due to COVID-19 virus   [facility] [place], Alessandra Bevels, [place]       Future Appointments             In 1 week Jacky Kindle, FNP [facility], [place] [facility]

## 2022-04-30 ENCOUNTER — Other Ambulatory Visit: Payer: Self-pay | Admitting: Family Medicine

## 2022-04-30 DIAGNOSIS — I10 Essential (primary) hypertension: Secondary | ICD-10-CM

## 2022-04-30 DIAGNOSIS — R232 Flushing: Secondary | ICD-10-CM

## 2022-05-03 NOTE — Telephone Encounter (Signed)
Requested medication (s) are due for refill today:   Not sure  Requested medication (s) are on the active medication list:   Yes as a vaginal cream.   This request is for tablets.  Future visit scheduled:   Yes in 1 wk with Robynn Pane   Last ordered: 04/28/2022 #180, 0 refills by Merita Norton  Returned because dose clarification needed.   Is she taking this as a vaginal cream or as a tablet?   If as a tablet is she supposed to be on 1 or 2 tablets?      Requested Prescriptions  Pending Prescriptions Disp Refills   estradiol (ESTRACE) 1 MG tablet [Pharmacy Med Name: ESTRADIOL 1 MG TABLET] 90 tablet 1    Sig: TAKE 1 TABLET BY MOUTH EVERY DAY     OB/GYN:  Estrogens Passed - 04/30/2022  5:18 PM      Passed - Mammogram is up-to-date per Health Maintenance      Passed - Last BP in normal range    BP Readings from Last 1 Encounters:  04/26/22 132/77         Passed - Valid encounter within last 12 months    Recent Outpatient Visits           4 months ago Primary hypertension   [facility] Caro Laroche, DO   12 months ago Welcome to [employer] preventive visit   [facility] Merita Norton T, FNP   1 year ago Essential hypertension   [place] [facility] [place], Marzella Schlein, MD   2 years ago Primary hypertension   [facility] [facility] [place], Victorino Dike [place], [place]   3 years ago Pneumonia due to COVID-19 virus   [facility] [place], Alessandra Bevels, [place]       Future Appointments             In 1 week Jacky Kindle, FNP [facility], [facility]

## 2022-05-10 ENCOUNTER — Encounter: Payer: Medicare HMO | Admitting: Family Medicine

## 2022-05-10 NOTE — Progress Notes (Unsigned)
I,J'ya E Melizza Kanode,acting as a scribe for Jacky Kindle, FNP.,have documented all relevant documentation on the behalf of Jacky Kindle, FNP,as directed by  Jacky Kindle, FNP while in the presence of Jacky Kindle, FNP.   Complete physical exam   Patient: Leslie Koch   DOB: 1955-06-12   67 y.o. Female  MRN: [id number] Visit Date: 05/11/2022  Today's healthcare provider: Jacky Kindle, FNP   Chief Complaint  Patient presents with   Annual Exam   Subjective    Leslie Koch is a 67 y.o. female who presents today for a complete physical exam.  She reports consuming a general diet. The patient does not participate in regular exercise at present. She generally feels fairly well. She reports sleeping fairly well. She does have additional problems to discuss today. She complains of fatigue and complains of leg pain every time she wakes up for the last week. Patient informed that she is due for Zooster and Pneumonia vaccine.  HPI   Past Medical History:  Diagnosis Date   Arthritis    GERD (gastroesophageal reflux disease)    Hypertension    PONV (postoperative nausea and vomiting)    Past Surgical History:  Procedure Laterality Date   ABDOMINAL HYSTERECTOMY  01/1999   COLONOSCOPY WITH PROPOFOL N/A 06/22/2021   Procedure: COLONOSCOPY WITH PROPOFOL;  Surgeon: Toney Reil, MD;  Location: [facility] ENDOSCOPY;  Service: Gastroenterology;  Laterality: N/A;   CYST EXCISION  1974   KNEE SURGERY Right 07/2010   TOTAL KNEE ARTHROPLASTY Right 10/12/2012   TOTAL KNEE ARTHROPLASTY WITH REVISION COMPONENTS Right 02/11/2016   Procedure: RIGHT TOTAL KNEE ARTHROPLASTY REVISION;  Surgeon: Ollen Gross, MD;  Location: [facility] ORS;  Service: Orthopedics;  Laterality: Right;   TUBAL LIGATION  1984   Social History   Socioeconomic History   Marital status: Married    Spouse name: Not on file   Number of children: Not on file   Years of education: Not on file   Highest education level: Not on file   Occupational History   Not on file  Tobacco Use   Smoking status: Former    Packs/day: 2.00    Years: 30.00    Additional pack years: 0.00    Total pack years: 60.00    Types: Cigarettes    Quit date: 04/14/1995    Years since quitting: 27.0   Smokeless tobacco: Never   Tobacco comments:    quit 04/14/1995  Vaping Use   Vaping Use: Never used  Substance and Sexual Activity   Alcohol use: Yes    Alcohol/week: 0.0 standard drinks of alcohol    Comment: occasionally 2-3 drinks once or twice a month   Drug use: No   Sexual activity: Yes  Other Topics Concern   Not on file  Social History Narrative   Not on file   Social Determinants of Health   Financial Resource Strain: Not on file  Food Insecurity: Not on file  Transportation Needs: Not on file  Physical Activity: Not on file  Stress: Not on file  Social Connections: Not on file  Intimate Partner Violence: Not on file   Family Status  Relation Name Status   Mother  Alive   Father  Deceased at age 10   MGM  Deceased   MGF  Deceased   PGM  Deceased   PGF  Deceased   Family History  Problem Relation Age of Onset   Hypertension Mother  Diabetes Mother    Coronary artery disease Mother    Lung cancer Father    Stroke Maternal Grandmother    Coronary artery disease Maternal Grandfather    Breast cancer Paternal Grandmother    Allergies  Allergen Reactions   Augmentin [Amoxicillin-Pot Clavulanate]     diarrhea   Montelukast Sodium Rash    Patient Care Team: Jacky Kindle, FNP as PCP - General (Family Medicine) Antonieta Iba, MD as Consulting Physician (Cardiology)   Medications: Outpatient Medications Prior to Visit  Medication Sig   albuterol (VENTOLIN HFA) 108 (90 Base) MCG/ACT inhaler Inhale 2 puffs into the lungs every 6 (six) hours as needed for wheezing or shortness of breath.   aspirin EC 81 MG tablet Take 81 mg by mouth daily.    Calcium Carbonate-Vitamin D (CALCIUM 500/D PO) Take 1 tablet  by mouth daily.   cetirizine (ZYRTEC) 10 MG tablet Take 10 mg by mouth daily.    Cholecalciferol (VITAMIN D3) 1000 units CAPS Take 1,000 Units by mouth in the morning and at bedtime.   fluticasone (FLONASE) 50 MCG/ACT nasal spray Place 2 sprays into both nostrils daily.   furosemide (LASIX) 40 MG tablet TAKE 1 TABLET BY MOUTH TWICE A DAY AS NEEDED (Patient taking differently: Once daily as needed)   Glucosamine-Chondroitin 750-600 MG TABS Take 1 tablet by mouth in the morning and at bedtime.   Omega-3 1000 MG CAPS Take 2,000 mg by mouth daily.   oxymetazoline (AFRIN) 0.05 % nasal spray Place 1-2 sprays into both nostrils 2 (two) times daily as needed for congestion.   potassium chloride (K-DUR) 10 MEQ tablet Take 1 tablet (10 mEq total) by mouth 2 (two) times daily as needed.   senna-docusate (SENOKOT-S) 8.6-50 MG tablet Take 1 tablet by mouth at bedtime.   simvastatin (ZOCOR) 40 MG tablet TAKE 1 TABLET BY MOUTH EVERYDAY AT BEDTIME   valACYclovir (VALTREX) 1000 MG tablet Take 1-2 tablets (1,000-2,000 mg total) by mouth every 12 (twelve) hours as needed. (Patient taking differently: Take 1,000-2,000 mg by mouth every 12 (twelve) hours as needed (PRN when cold sores appear).)   [DISCONTINUED] celecoxib (CELEBREX) 200 MG capsule TAKE 1 CAPSULE BY MOUTH EVERY DAY   [DISCONTINUED] estradiol (ESTRACE) 0.1 MG/GM vaginal cream Place 1 Applicatorful vaginally 3 (three) times a week.   [DISCONTINUED] estradiol (ESTRACE) 1 MG tablet TAKE 2 TABLETS BY MOUTH EVERY DAY   [DISCONTINUED] lisinopril-hydrochlorothiazide (ZESTORETIC) 20-12.5 MG tablet TAKE 1 TABLET BY MOUTH EVERY DAY   [DISCONTINUED] Multiple Vitamin (MULTIVITAMIN) tablet Take 1 tablet by mouth daily. (Patient not taking: Reported on 05/11/2022)   No facility-administered medications prior to visit.    Review of Systems  Musculoskeletal:  Positive for arthralgias and back pain.  Allergic/Immunologic: Positive for environmental allergies.     Objective    BP 128/74 (BP Location: Left Arm, Patient Position: Sitting, Cuff Size: Large)   Pulse 77   Temp 98.1 F (36.7 C) (Oral)   Ht 5\' 1"  (1.549 m)   Wt 229 lb (103.9 kg)   SpO2 94%   BMI 43.27 kg/m   Physical Exam Vitals and nursing note reviewed.  Constitutional:      General: She is awake. She is not in acute distress.    Appearance: Normal appearance. She is well-developed and well-groomed. She is obese. She is not ill-appearing, toxic-appearing or diaphoretic.  HENT:     Head: Normocephalic and atraumatic.     Jaw: There is normal jaw occlusion. No trismus, tenderness,  swelling or pain on movement.     Right Ear: Hearing, tympanic membrane, ear canal and external ear normal. There is no impacted cerumen.     Left Ear: Hearing, tympanic membrane, ear canal and external ear normal. There is no impacted cerumen.     Nose: Nose normal. No congestion or rhinorrhea.     Right Turbinates: Not enlarged, swollen or pale.     Left Turbinates: Not enlarged, swollen or pale.     Right Sinus: No maxillary sinus tenderness or frontal sinus tenderness.     Left Sinus: No maxillary sinus tenderness or frontal sinus tenderness.     Mouth/Throat:     Lips: Pink.     Mouth: Mucous membranes are moist. No injury.     Tongue: No lesions.     Pharynx: Oropharynx is clear. Uvula midline. No pharyngeal swelling, oropharyngeal exudate, posterior oropharyngeal erythema or uvula swelling.     Tonsils: No tonsillar exudate or tonsillar abscesses.  Eyes:     General: Lids are normal. Lids are everted, no foreign bodies appreciated. Vision grossly intact. Gaze aligned appropriately. No allergic shiner or visual field deficit.       Right eye: No discharge.        Left eye: No discharge.     Extraocular Movements: Extraocular movements intact.     Conjunctiva/sclera: Conjunctivae normal.     Right eye: Right conjunctiva is not injected. No exudate.    Left eye: Left conjunctiva is not injected.  No exudate.    Pupils: Pupils are equal, round, and reactive to light.  Neck:     Thyroid: No thyroid mass, thyromegaly or thyroid tenderness.     Vascular: No carotid bruit.     Trachea: Trachea normal.  Cardiovascular:     Rate and Rhythm: Normal rate and regular rhythm.     Pulses: Normal pulses.          Carotid pulses are 2+ on the right side and 2+ on the left side.      Radial pulses are 2+ on the right side and 2+ on the left side.       Dorsalis pedis pulses are 2+ on the right side and 2+ on the left side.       Posterior tibial pulses are 2+ on the right side and 2+ on the left side.     Heart sounds: Normal heart sounds, S1 normal and S2 normal. No murmur heard.    No friction rub. No gallop.  Pulmonary:     Effort: Pulmonary effort is normal. No respiratory distress.     Breath sounds: Normal breath sounds and air entry. No stridor. No wheezing, rhonchi or rales.  Chest:     Chest wall: No tenderness.  Abdominal:     General: Abdomen is flat. Bowel sounds are normal. There is no distension.     Palpations: Abdomen is soft. There is no mass.     Tenderness: There is no abdominal tenderness. There is no right CVA tenderness, left CVA tenderness, guarding or rebound.     Hernia: No hernia is present.  Genitourinary:    Comments: Exam deferred; denies complaints Musculoskeletal:        General: Swelling present. No tenderness, deformity or signs of injury. Normal range of motion.     Cervical back: Full passive range of motion without pain, normal range of motion and neck supple. No edema, rigidity or tenderness. No muscular tenderness.     Right  lower leg: 2+ Pitting Edema present.     Left lower leg: 2+ Pitting Edema present.  Lymphadenopathy:     Cervical: No cervical adenopathy.     Right cervical: No superficial, deep or posterior cervical adenopathy.    Left cervical: No superficial, deep or posterior cervical adenopathy.  Skin:    General: Skin is warm and dry.      Capillary Refill: Capillary refill takes less than 2 seconds.     Coloration: Skin is not jaundiced or pale.     Findings: No bruising, erythema, lesion or rash.  Neurological:     General: No focal deficit present.     Mental Status: She is alert and oriented to person, place, and time. Mental status is at baseline.     GCS: GCS eye subscore is 4. GCS verbal subscore is 5. GCS motor subscore is 6.     Sensory: Sensation is intact. No sensory deficit.     Motor: Motor function is intact. No weakness.     Coordination: Coordination is intact. Coordination normal.     Gait: Gait is intact. Gait normal.  Psychiatric:        Attention and Perception: Attention and perception normal.        Mood and Affect: Mood and affect normal.        Speech: Speech normal.        Behavior: Behavior normal. Behavior is cooperative.        Thought Content: Thought content normal.        Cognition and Memory: Cognition and memory normal.        Judgment: Judgment normal.     Last depression screening scores    05/11/2022   11:06 AM 12/14/2021   10:29 AM 05/05/2021   10:39 AM  PHQ 2/9 Scores  PHQ - 2 Score 2 4 0  PHQ- 9 Score 7 9 3    Last fall risk screening    05/11/2022   11:06 AM  Fall Risk   Falls in the past year? 0  Number falls in past yr: 0  Injury with Fall? 0  Risk for fall due to : No Fall Risks   Last Audit-C alcohol use screening    05/11/2022   11:07 AM  Alcohol Use Disorder Test (AUDIT)  1. How often do you have a drink containing alcohol? 1  2. How many drinks containing alcohol do you have on a typical day when you are drinking? 0  3. How often do you have six or more drinks on one occasion? 0  AUDIT-C Score 1   A score of 3 or more in women, and 4 or more in men indicates increased risk for alcohol abuse, EXCEPT if all of the points are from question 1   No results found for any visits on 05/11/22.  Assessment & Plan    Routine Health Maintenance and Physical  Exam  Exercise Activities and Dietary recommendations  Goals   None     Immunization History  Administered Date(s) Administered   Moderna Sars-Covid-2 Vaccination 01/22/2019, 05/12/2019   Pneumococcal Polysaccharide-23 05/05/2021   Td 09/01/2020   Tdap 02/24/2007    Health Maintenance  Topic Date Due   Zoster Vaccines- Shingrix (1 of 2) Never done   COVID-19 Vaccine (3 - Moderna risk series) 06/09/2019   Pneumonia Vaccine 16+ Years old (2 of 2 - PCV) 05/06/2022   INFLUENZA VACCINE  08/12/2022   Medicare Annual Wellness (AWV)  05/11/2023  MAMMOGRAM  09/08/2023   COLONOSCOPY (Pts 45-53yrs Insurance coverage will need to be confirmed)  06/24/2028   DTaP/Tdap/Td (3 - Td or Tdap) 09/02/2030   DEXA SCAN  Completed   Hepatitis C Screening  Completed   HPV VACCINES  Aged Out    Discussed health benefits of physical activity, and encouraged her to engage in regular exercise appropriate for her age and condition.  Problem List Items Addressed This Visit       Cardiovascular and Mediastinum   Primary hypertension    Chronic, well controlled Continues on low dose ASA 81 mg, Lasix 40 mg, Zestoretic 20-12.5 mg       Relevant Medications   lisinopril-hydrochlorothiazide (ZESTORETIC) 20-12.5 MG tablet   Other Relevant Orders   CBC with Differential/Platelet   Comprehensive Metabolic Panel (CMET)   TSH + free T4     Genitourinary   Vaginal dryness    Chronic, stable Wishes to continue medications to assist with symptoms Continues on daily estrogen and topical to assist      Relevant Medications   estradiol (ESTRACE) 0.1 MG/GM vaginal cream (Start on 05/12/2022)   estradiol (ESTRACE) 1 MG tablet     Other   Annual physical exam    Due for dental Due for vision Things to do to keep yourself healthy  - Exercise at least 30-45 minutes a day, 3-4 days a week.  - Eat a low-fat diet with lots of fruits and vegetables, up to 7-9 servings per day.  - Seatbelts can save your  life. Wear them always.  - Smoke detectors on every level of your home, check batteries every year.  - Eye Doctor - have an eye exam every 1-2 years  - Safe sex - if you may be exposed to STDs, use a condom.  - Alcohol -  If you drink, do it moderately, less than 2 drinks per day.  - Health Care Power of Attorney. Choose someone to speak for you if you are not able.  - Depression is common in our stressful world.If you're feeling down or losing interest in things you normally enjoy, please come in for a visit.  - Violence - If anyone is threatening or hurting you, please call immediately.       Arthralgia    Chronic, stable Continues to get injections in L knee to assist with buying time for knee replacement Wishes to continue on celebrex 200 mg to assist with chronic pain      Relevant Medications   celecoxib (CELEBREX) 200 MG capsule   Avitaminosis D   Relevant Orders   Vitamin D (25 hydroxy)   Chronic pain of left knee    Chronic, stable Continues to get injections in L knee to assist with buying time for knee replacement Wishes to continue on celebrex 200 mg to assist with chronic pain      Relevant Medications   celecoxib (CELEBREX) 200 MG capsule   Other Relevant Orders   CBC with Differential/Platelet   Comprehensive Metabolic Panel (CMET)   Encounter for subsequent annual wellness visit (AWV) in Medicare patient - Primary    Referral placed for mammogram and DEXA Defer vaccines given patient is caring for her mother and does not have time for sickness/illness      Fatigue due to exposure    Exposure to mom since 04/29/22- with flu, ongoing fatigue Will check vitamins, thyroids and cell counts/blood chemisty Continue to monitor symptoms; recommend balanced diet, salt monitoring and adequate sleep/exercise  PHQ9 elevation iso symptoms; pt denies concern for depression, SI/HI      Relevant Orders   Home sleep test   Hypercholesterolemia    Chronic, previously  elevated On zocor 40 mg Check FLP LDL goal <100 recommend diet low in saturated fat and regular exercise - 30 min at least 5 times per week       Relevant Medications   lisinopril-hydrochlorothiazide (ZESTORETIC) 20-12.5 MG tablet   Other Relevant Orders   Lipid panel   Morbid obesity (HCC)    Chronic, weight gain noted Body mass index is 43.27 kg/m. Discussed importance of healthy weight management Discussed diet and exercise       Relevant Orders   CBC with Differential/Platelet   Comprehensive Metabolic Panel (CMET)   TSH + free T4   Hemoglobin A1c   Lipid panel   Screening mammogram for breast cancer    Due for screening for mammogram, denies breast concerns, provided with phone number to call and schedule appointment for mammogram. Encouraged to repeat breast cancer screening every 1-2 years.       Relevant Orders   MM 3D SCREENING MAMMOGRAM BILATERAL BREAST   Snoring    Chronic, wakes herself up from sleep Also noted with HTN, SOB/DOE, LE edema Recommend sleep study to assist with acute/chronic s/s      Relevant Orders   Home sleep test   Return in about 1 year (around 05/11/2023) for annual examination.    Leilani Merl, FNP, have reviewed all documentation for this visit. The documentation on 05/11/22 for the exam, diagnosis, procedures, and orders are all accurate and complete.  Jacky Kindle, FNP  [facility] [phone] (phone) [phone] (fax)  [facility]

## 2022-05-11 ENCOUNTER — Ambulatory Visit (INDEPENDENT_AMBULATORY_CARE_PROVIDER_SITE_OTHER): Payer: Medicare HMO | Admitting: Family Medicine

## 2022-05-11 ENCOUNTER — Encounter: Payer: Self-pay | Admitting: Family Medicine

## 2022-05-11 VITALS — BP 128/74 | HR 77 | Temp 98.1°F | Ht 61.0 in | Wt 229.0 lb

## 2022-05-11 DIAGNOSIS — Z0000 Encounter for general adult medical examination without abnormal findings: Secondary | ICD-10-CM | POA: Insufficient documentation

## 2022-05-11 DIAGNOSIS — T732XXA Exhaustion due to exposure, initial encounter: Secondary | ICD-10-CM | POA: Insufficient documentation

## 2022-05-11 DIAGNOSIS — R232 Flushing: Secondary | ICD-10-CM | POA: Insufficient documentation

## 2022-05-11 DIAGNOSIS — G8929 Other chronic pain: Secondary | ICD-10-CM

## 2022-05-11 DIAGNOSIS — E7800 Pure hypercholesterolemia, unspecified: Secondary | ICD-10-CM

## 2022-05-11 DIAGNOSIS — N898 Other specified noninflammatory disorders of vagina: Secondary | ICD-10-CM

## 2022-05-11 DIAGNOSIS — R0683 Snoring: Secondary | ICD-10-CM | POA: Insufficient documentation

## 2022-05-11 DIAGNOSIS — R7303 Prediabetes: Secondary | ICD-10-CM | POA: Diagnosis not present

## 2022-05-11 DIAGNOSIS — E559 Vitamin D deficiency, unspecified: Secondary | ICD-10-CM | POA: Diagnosis not present

## 2022-05-11 DIAGNOSIS — M2550 Pain in unspecified joint: Secondary | ICD-10-CM | POA: Insufficient documentation

## 2022-05-11 DIAGNOSIS — Z1231 Encounter for screening mammogram for malignant neoplasm of breast: Secondary | ICD-10-CM | POA: Insufficient documentation

## 2022-05-11 DIAGNOSIS — I10 Essential (primary) hypertension: Secondary | ICD-10-CM | POA: Diagnosis not present

## 2022-05-11 DIAGNOSIS — T732XXS Exhaustion due to exposure, sequela: Secondary | ICD-10-CM

## 2022-05-11 MED ORDER — ESTRADIOL 1 MG PO TABS: 2.0000 mg | ORAL_TABLET | Freq: Every day | ORAL | 4 refills | Status: DC

## 2022-05-11 MED ORDER — CELECOXIB 200 MG PO CAPS: 200.0000 mg | ORAL_CAPSULE | Freq: Every day | ORAL | 4 refills | Status: DC

## 2022-05-11 MED ORDER — ESTRADIOL 0.1 MG/GM VA CREA
1.0000 | TOPICAL_CREAM | VAGINAL | 12 refills | Status: AC
Start: 2022-05-12 — End: ?

## 2022-05-11 MED ORDER — LISINOPRIL-HYDROCHLOROTHIAZIDE 20-12.5 MG PO TABS
1.0000 | ORAL_TABLET | Freq: Every day | ORAL | 4 refills | Status: DC
Start: 2022-05-11 — End: 2023-07-14

## 2022-05-11 NOTE — Assessment & Plan Note (Signed)
Chronic, previously elevated On zocor 40 mg Check FLP LDL goal <100 recommend diet low in saturated fat and regular exercise - 30 min at least 5 times per week

## 2022-05-11 NOTE — Assessment & Plan Note (Signed)
Chronic, stable Wishes to continue medications to assist with symptoms Continues on daily estrogen and topical to assist

## 2022-05-11 NOTE — Assessment & Plan Note (Signed)
Chronic, well controlled Continues on low dose ASA 81 mg, Lasix 40 mg, Zestoretic 20-12.5 mg

## 2022-05-11 NOTE — Assessment & Plan Note (Signed)
Due for dental Due for vision Things to do to keep yourself healthy  - Exercise at least 30-45 minutes a day, 3-4 days a week.  - Eat a low-fat diet with lots of fruits and vegetables, up to 7-9 servings per day.  - Seatbelts can save your life. Wear them always.  - Smoke detectors on every level of your home, check batteries every year.  - Eye Doctor - have an eye exam every 1-2 years  - Safe sex - if you may be exposed to STDs, use a condom.  - Alcohol -  If you drink, do it moderately, less than 2 drinks per day.  - Health Care Power of Attorney. Choose someone to speak for you if you are not able.  - Depression is common in our stressful world.If you're feeling down or losing interest in things you normally enjoy, please come in for a visit.  - Violence - If anyone is threatening or hurting you, please call immediately.   

## 2022-05-11 NOTE — Assessment & Plan Note (Signed)
Chronic, stable Continues to get injections in L knee to assist with buying time for knee replacement Wishes to continue on celebrex 200 mg to assist with chronic pain

## 2022-05-11 NOTE — Assessment & Plan Note (Signed)
Chronic, stable Continues to get injections in L knee to assist with buying time for knee replacement Wishes to continue on celebrex 200 mg to assist with chronic pain 

## 2022-05-11 NOTE — Assessment & Plan Note (Signed)
Chronic, weight gain noted Body mass index is 43.27 kg/m. Discussed importance of healthy weight management Discussed diet and exercise

## 2022-05-11 NOTE — Assessment & Plan Note (Signed)
Due for screening for mammogram, denies breast concerns, provided with phone number to call and schedule appointment for mammogram. Encouraged to repeat breast cancer screening every 1-2 years.  

## 2022-05-11 NOTE — Assessment & Plan Note (Signed)
Exposure to mom since 04/29/22- with flu, ongoing fatigue Will check vitamins, thyroids and cell counts/blood chemisty Continue to monitor symptoms; recommend balanced diet, salt monitoring and adequate sleep/exercise PHQ9 elevation iso symptoms; pt denies concern for depression, SI/HI

## 2022-05-11 NOTE — Assessment & Plan Note (Signed)
Chronic, wakes herself up from sleep Also noted with HTN, SOB/DOE, LE edema Recommend sleep study to assist with acute/chronic s/s

## 2022-05-11 NOTE — Assessment & Plan Note (Signed)
Referral placed for mammogram and DEXA Defer vaccines given patient is caring for her mother and does not have time for sickness/illness

## 2022-05-12 LAB — CBC WITH DIFFERENTIAL/PLATELET
Basophils Absolute: 0.1 10*3/uL (ref 0.0–0.2)
Basos: 1 %
EOS (ABSOLUTE): 0.1 10*3/uL (ref 0.0–0.4)
Eos: 2 %
Hematocrit: 41 % (ref 34.0–46.6)
Hemoglobin: 13.8 g/dL (ref 11.1–15.9)
Immature Grans (Abs): 0 10*3/uL (ref 0.0–0.1)
Immature Granulocytes: 0 %
Lymphocytes Absolute: 2.2 10*3/uL (ref 0.7–3.1)
Lymphs: 37 %
MCH: 30.9 pg (ref 26.6–33.0)
MCHC: 33.7 g/dL (ref 31.5–35.7)
MCV: 92 fL (ref 79–97)
Monocytes Absolute: 0.5 10*3/uL (ref 0.1–0.9)
Monocytes: 8 %
Neutrophils Absolute: 3.2 10*3/uL (ref 1.4–7.0)
Neutrophils: 52 %
Platelets: 310 10*3/uL (ref 150–450)
RBC: 4.47 x10E6/uL (ref 3.77–5.28)
RDW: 13 % (ref 11.7–15.4)
WBC: 6.1 10*3/uL (ref 3.4–10.8)

## 2022-05-12 LAB — COMPREHENSIVE METABOLIC PANEL
ALT: 13 IU/L (ref 0–32)
AST: 19 IU/L (ref 0–40)
Albumin/Globulin Ratio: 1.7 (ref 1.2–2.2)
Albumin: 3.9 g/dL (ref 3.9–4.9)
Alkaline Phosphatase: 53 IU/L (ref 44–121)
BUN/Creatinine Ratio: 18 (ref 12–28)
BUN: 14 mg/dL (ref 8–27)
Bilirubin Total: 0.4 mg/dL (ref 0.0–1.2)
CO2: 22 mmol/L (ref 20–29)
Calcium: 8.6 mg/dL — ABNORMAL LOW (ref 8.7–10.3)
Chloride: 100 mmol/L (ref 96–106)
Creatinine, Ser: 0.79 mg/dL (ref 0.57–1.00)
Globulin, Total: 2.3 g/dL (ref 1.5–4.5)
Glucose: 91 mg/dL (ref 70–99)
Potassium: 4.2 mmol/L (ref 3.5–5.2)
Sodium: 137 mmol/L (ref 134–144)
Total Protein: 6.2 g/dL (ref 6.0–8.5)
eGFR: 82 mL/min/{1.73_m2} (ref >59)

## 2022-05-12 LAB — HEMOGLOBIN A1C
Est. average glucose Bld gHb Est-mCnc: 114 mg/dL
Hgb A1c MFr Bld: 5.6 % (ref 4.8–5.6)

## 2022-05-12 LAB — LIPID PANEL
Chol/HDL Ratio: 3 ratio (ref 0.0–4.4)
Cholesterol, Total: 199 mg/dL (ref 100–199)
HDL: 66 mg/dL (ref >39)
LDL Chol Calc (NIH): 101 mg/dL — ABNORMAL HIGH (ref 0–99)
Triglycerides: 186 mg/dL — ABNORMAL HIGH (ref 0–149)
VLDL Cholesterol Cal: 32 mg/dL (ref 5–40)

## 2022-05-12 LAB — TSH+FREE T4
Free T4: 0.99 ng/dL (ref 0.82–1.77)
TSH: 3.36 u[IU]/mL (ref 0.450–4.500)

## 2022-05-12 LAB — VITAMIN D 25 HYDROXY (VIT D DEFICIENCY, FRACTURES): Vit D, 25-Hydroxy: 54.4 ng/mL (ref 30.0–100.0)

## 2022-05-12 NOTE — Progress Notes (Signed)
Cholesterol is increased from last year showing borderline elevation in fats as well as bad/LDL cholesterol. The 10-year ASCVD risk score (Arnett DK, et al., 2019) is: 7.7% Moderate risk of heart attack/stroke- could start lipitor 40 mg to assist diet if desired. Other labs are normal/stable.

## 2022-05-24 ENCOUNTER — Ambulatory Visit (INDEPENDENT_AMBULATORY_CARE_PROVIDER_SITE_OTHER): Payer: Medicare HMO

## 2022-05-24 VITALS — Ht 61.0 in | Wt 229.0 lb

## 2022-05-24 DIAGNOSIS — Z0000 Encounter for general adult medical examination without abnormal findings: Secondary | ICD-10-CM

## 2022-05-24 NOTE — Progress Notes (Signed)
I connected with  Leslie Koch on 05/24/22 by a audio enabled telemedicine application and verified that I am speaking with the correct person using two identifiers.  Patient Location: Home  Provider Location: Office/Clinic  I discussed the limitations of evaluation and management by telemedicine. The patient expressed understanding and agreed to proceed.  Subjective:   Leslie Koch is a 67 y.o. female who presents for Medicare Annual (Subsequent) preventive examination.  Review of Systems    Cardiac Risk Factors include: advanced age (>68men, >79 women);hypertension;obesity (BMI >30kg/m2);sedentary lifestyle    Objective:    Today's Vitals   05/24/22 1536  Weight: 229 lb (103.9 kg)  Height: 5\' 1"  (1.549 m)  PainSc: 2    Body mass index is 43.27 kg/m.     05/24/2022    3:47 PM 11/24/2021   10:48 AM 06/22/2021    7:07 AM 02/06/2019   11:40 AM 02/04/2019    5:04 PM 10/27/2016    3:46 PM 02/11/2016    8:17 PM  Advanced Directives  Does Patient Have a Medical Advance Directive? No No Yes No No Yes Yes  Type of Surveyor, minerals;Living will   Healthcare Power of [place];Living will Living will;Healthcare Power of Attorney  Does patient want to make changes to medical advance directive?       No - Patient declined  Copy of Healthcare Power of Attorney in Chart?       No - copy requested  Would patient like information on creating a medical advance directive?  No - Patient declined  No - Patient declined No - Patient declined      Current Medications (verified) Outpatient Encounter Medications as of 05/24/2022  Medication Sig   albuterol (VENTOLIN HFA) 108 (90 Base) MCG/ACT inhaler Inhale 2 puffs into the lungs every 6 (six) hours as needed for wheezing or shortness of breath.   aspirin EC 81 MG tablet Take 81 mg by mouth daily.    Calcium Carbonate-Vitamin D (CALCIUM 500/D PO) Take 1 tablet by mouth daily.   celecoxib (CELEBREX) 200 MG capsule  Take 1 capsule (200 mg total) by mouth daily.   cetirizine (ZYRTEC) 10 MG tablet Take 10 mg by mouth daily.    Cholecalciferol (VITAMIN D3) 1000 units CAPS Take 1,000 Units by mouth in the morning and at bedtime.   estradiol (ESTRACE) 0.1 MG/GM vaginal cream Place 1 Applicatorful vaginally 3 (three) times a week.   estradiol (ESTRACE) 1 MG tablet Take 2 tablets (2 mg total) by mouth daily.   fluticasone (FLONASE) 50 MCG/ACT nasal spray Place 2 sprays into both nostrils daily.   furosemide (LASIX) 40 MG tablet TAKE 1 TABLET BY MOUTH TWICE A DAY AS NEEDED (Patient taking differently: Once daily as needed)   Glucosamine-Chondroitin 750-600 MG TABS Take 1 tablet by mouth in the morning and at bedtime.   lisinopril-hydrochlorothiazide (ZESTORETIC) 20-12.5 MG tablet Take 1 tablet by mouth daily.   Omega-3 1000 MG CAPS Take 2,000 mg by mouth daily.   oxymetazoline (AFRIN) 0.05 % nasal spray Place 1-2 sprays into both nostrils 2 (two) times daily as needed for congestion.   potassium chloride (K-DUR) 10 MEQ tablet Take 1 tablet (10 mEq total) by mouth 2 (two) times daily as needed.   senna-docusate (SENOKOT-S) 8.6-50 MG tablet Take 1 tablet by mouth at bedtime.   simvastatin (ZOCOR) 40 MG tablet TAKE 1 TABLET BY MOUTH EVERYDAY AT BEDTIME   valACYclovir (VALTREX) 1000 MG tablet Take  1-2 tablets (1,000-2,000 mg total) by mouth every 12 (twelve) hours as needed. (Patient taking differently: Take 1,000-2,000 mg by mouth every 12 (twelve) hours as needed (PRN when cold sores appear).)   No facility-administered encounter medications on file as of 05/24/2022.    Allergies (verified) Augmentin [amoxicillin-pot clavulanate] and Montelukast sodium   History: Past Medical History:  Diagnosis Date   Arthritis    GERD (gastroesophageal reflux disease)    Hypertension    PONV (postoperative nausea and vomiting)    Past Surgical History:  Procedure Laterality Date   ABDOMINAL HYSTERECTOMY  01/1999    COLONOSCOPY WITH PROPOFOL N/A 06/22/2021   Procedure: COLONOSCOPY WITH PROPOFOL;  Surgeon: Toney Reil, MD;  Location: [facility] ENDOSCOPY;  Service: Gastroenterology;  Laterality: N/A;   CYST EXCISION  1974   KNEE SURGERY Right 07/2010   TOTAL KNEE ARTHROPLASTY Right 10/12/2012   TOTAL KNEE ARTHROPLASTY WITH REVISION COMPONENTS Right 02/11/2016   Procedure: RIGHT TOTAL KNEE ARTHROPLASTY REVISION;  Surgeon: Ollen Gross, MD;  Location: [facility];  Service: Orthopedics;  Laterality: Right;   TUBAL LIGATION  1984   Family History  Problem Relation Age of Onset   Hypertension Mother    Diabetes Mother    Coronary artery disease Mother    Lung cancer Father    Stroke Maternal Grandmother    Coronary artery disease Maternal Grandfather    Breast cancer Paternal Grandmother    Social History   Socioeconomic History   Marital status: Married    Spouse name: Not on file   Number of children: Not on file   Years of education: Not on file   Highest education level: Not on file  Occupational History   Not on file  Tobacco Use   Smoking status: Former    Packs/day: 2.00    Years: 30.00    Additional pack years: 0.00    Total pack years: 60.00    Types: Cigarettes    Quit date: 04/14/1995    Years since quitting: 27.1   Smokeless tobacco: Never   Tobacco comments:    quit 04/14/1995  Vaping Use   Vaping Use: Never used  Substance and Sexual Activity   Alcohol use: Yes    Alcohol/week: 0.0 standard drinks of alcohol    Comment: occasionally 2-3 drinks once or twice a month   Drug use: No   Sexual activity: Yes  Other Topics Concern   Not on file  Social History Narrative   Not on file   Social Determinants of Health   Financial Resource Strain: Low Risk  (05/24/2022)   Overall Financial Resource Strain (CARDIA)    Difficulty of Paying Living Expenses: Not hard at all  Food Insecurity: No Food Insecurity (05/24/2022)   Hunger Vital Sign    Worried About Running Out of Food in  the Last Year: Never true    Ran Out of Food in the Last Year: Never true  Transportation Needs: No Transportation Needs (05/24/2022)   PRAPARE - Administrator, Civil Service (Medical): No    Lack of Transportation (Non-Medical): No  Physical Activity: Inactive (05/24/2022)   Exercise Vital Sign    Days of Exercise per Week: 0 days    Minutes of Exercise per Session: 0 min  Stress: No Stress Concern Present (05/24/2022)   [employer] of Occupational Health - Occupational Stress Questionnaire    Feeling of Stress : Not at all  Social Connections: Moderately Integrated (05/24/2022)   Social Connection and  Isolation Panel [NHANES]    Frequency of Communication with Friends and Family: More than three times a week    Frequency of Social Gatherings with Friends and Family: Three times a week    Attends Religious Services: 1 to 4 times per year    Active Member of Clubs or Organizations: No    Attends Banker Meetings: Never    Marital Status: Married    Tobacco Counseling Counseling given: Not Answered Tobacco comments: quit 04/14/1995   Clinical Intake:  Pre-visit preparation completed: Yes  Pain : 0-10 Pain Score: 2  Pain Type: Chronic pain Pain Location: Knee (both) Pain Relieving Factors: rest and tylenol makes it better  Pain Relieving Factors: rest and tylenol makes it better  BMI - recorded: 43.27 Nutritional Status: BMI > 30  Obese Nutritional Risks: None  How often do you need to have someone help you when you read instructions, pamphlets, or other written materials from your doctor or pharmacy?: 1 - Never  Diabetic?no  Interpreter Needed?: No  Comments: lives w/husband Information entered by :: B.Sheyla Zaffino,LPN   Activities of Daily Living    05/24/2022    3:48 PM 05/11/2022   11:06 AM  In your present state of health, do you have any difficulty performing the following activities:  Hearing? 0 0  Vision? 0 0  Difficulty  concentrating or making decisions?  0  Walking or climbing stairs? 1 1  Dressing or bathing? 0 0  Doing errands, shopping? 0 0  Preparing Food and eating ? N   Using the Toilet? N   In the past six months, have you accidently leaked urine? N   Do you have problems with loss of bowel control? N   Managing your Medications? N   Managing your Finances? N   Housekeeping or managing your Housekeeping? N     Patient Care Team: Jacky Kindle, FNP as PCP - General (Family Medicine) Antonieta Iba, MD as Consulting Physician (Cardiology)  Indicate any recent Medical Services you may have received from other than [facility] providers in the past year (date may be approximate).     Assessment:   This is a routine wellness examination for Chloey.  Hearing/Vision screen Hearing Screening - Comments:: Adequate hearing Vision Screening - Comments:: Adequate vision w/glasses Dr Edger House  Dietary issues and exercise activities discussed: Current Exercise Habits: The patient does not participate in regular exercise at present, Exercise limited by: orthopedic condition(s)   Goals Addressed   None    Depression Screen    05/24/2022    3:43 PM 05/11/2022   11:06 AM 12/14/2021   10:29 AM 05/05/2021   10:39 AM 09/01/2020   10:13 AM 03/03/2020   11:32 AM  PHQ 2/9 Scores  PHQ - 2 Score 2 2 4  0 3 1  PHQ- 9 Score 5 7 9 3 12 10     Fall Risk    05/24/2022    3:39 PM 05/11/2022   11:06 AM 12/14/2021   10:28 AM 09/01/2020   10:13 AM 03/03/2020   11:32 AM  Fall Risk   Falls in the past year? 1 0 0 0 0  Number falls in past yr: 1 0 0 0 0  Injury with Fall? 0 0 0 0 0  Risk for fall due to : No Fall Risks No Fall Risks No Fall Risks No Fall Risks   Follow up Education provided;Falls prevention discussed  Falls evaluation completed      FALL  RISK PREVENTION PERTAINING TO THE HOME:  Any stairs in or around the home? Yes  If so, are there any without handrails? Yes  Home free of loose throw rugs in  walkways, pet beds, electrical cords, etc? Yes  Adequate lighting in your home to reduce risk of falls? Yes   ASSISTIVE DEVICES UTILIZED TO PREVENT FALLS:  Life alert? No  Use of a cane, walker or w/c? No  Grab bars in the bathroom? Yes  Shower chair or bench in shower? Yes  Elevated toilet seat or a handicapped toilet? Yes   Cognitive Function:        05/24/2022    3:53 PM  6CIT Screen  What Year? 0 points  What month? 0 points  What time? 0 points  Count back from 20 0 points  Months in reverse 0 points  Repeat phrase 0 points  Total Score 0 points    Immunizations Immunization History  Administered Date(s) Administered   Moderna Sars-Covid-2 Vaccination 01/22/2019, 05/12/2019   Pneumococcal Polysaccharide-23 05/05/2021   Td 09/01/2020   Tdap 02/24/2007    TDAP status: Up to date  Flu Vaccine status: Declined, Education has been provided regarding the importance of this vaccine but patient still declined. Advised may receive this vaccine at local pharmacy or Health Dept. Aware to provide a copy of the vaccination record if obtained from local pharmacy or Health Dept. Verbalized acceptance and understanding.  Pneumococcal vaccine status: Up to date  Covid-19 vaccine status: Completed vaccines  Qualifies for Shingles Vaccine? Yes   Zostavax completed No   Shingrix Completed?: No.    Education has been provided regarding the importance of this vaccine. Patient has been advised to call insurance company to determine out of pocket expense if they have not yet received this vaccine. Advised may also receive vaccine at local pharmacy or Health Dept. Verbalized acceptance and understanding.  Screening Tests Health Maintenance  Topic Date Due   Zoster Vaccines- Shingrix (1 of 2) Never done   COVID-19 Vaccine (3 - Moderna risk series) 06/09/2019   Pneumonia Vaccine 39+ Years old (2 of 2 - PCV) 05/06/2022   INFLUENZA VACCINE  08/12/2022   Medicare Annual Wellness (AWV)   05/24/2023   MAMMOGRAM  09/08/2023   COLONOSCOPY (Pts 45-21yrs Insurance coverage will need to be confirmed)  06/24/2028   DTaP/Tdap/Td (3 - Td or Tdap) 09/02/2030   DEXA SCAN  Completed   Hepatitis C Screening  Completed   HPV VACCINES  Aged Out    Health Maintenance  Health Maintenance Due  Topic Date Due   Zoster Vaccines- Shingrix (1 of 2) Never done   COVID-19 Vaccine (3 - Moderna risk series) 06/09/2019   Pneumonia Vaccine 31+ Years old (2 of 2 - PCV) 05/06/2022    Colorectal cancer screening: Type of screening: Colonoscopy. Completed yes. Repeat every 5-10 years  Mammogram status: Completed yes. Repeat every year  Bone Density status: Completed yes. Results reflect: Bone density results: NORMAL. Repeat every 5 years.  Lung Cancer Screening: (Low Dose CT Chest recommended if Age 2-80 years, 30 pack-year currently smoking OR have quit w/in 15years.) does not qualify.   Lung Cancer Screening Referral: no  Additional Screening:  Hepatitis C Screening: does not qualify; Completed yes  Vision Screening: Recommended annual ophthalmology exams for early detection of glaucoma and other disorders of the eye. Is the patient up to date with their annual eye exam?  Yes  Who is the provider or what is the name of  the office in which the patient attends annual eye exams? Dr Edger House If pt is not established with a provider, would they like to be referred to a provider to establish care? No .   Dental Screening: Recommended annual dental exams for proper oral hygiene  Community Resource Referral / Chronic Care Management: CRR required this visit?  No   CCM required this visit?  No      Plan:     I have personally reviewed and noted the following in the patient's chart:   Medical and social history Use of alcohol, tobacco or illicit drugs  Current medications and supplements including opioid prescriptions. Patient is not currently taking opioid prescriptions. Functional  ability and status Nutritional status Physical activity Advanced directives List of other physicians Hospitalizations, surgeries, and ER visits in previous 12 months Vitals Screenings to include cognitive, depression, and falls Referrals and appointments  In addition, I have reviewed and discussed with patient certain preventive protocols, quality metrics, and best practice recommendations. A written personalized care plan for preventive services as well as general preventive health recommendations were provided to patient.     Sue Lush, LPN   [id number]/36/[id number]   Nurse Notes: The patient states she is doing well and has no concerns or questions at this time.

## 2022-05-24 NOTE — Patient Instructions (Signed)
[name] , Thank you for taking time to come for your Medicare Wellness Visit. I appreciate your ongoing commitment to your health goals. Please review the following plan we discussed and let me know if I can assist you in the future.   These are the goals we discussed:  Goals   None     This is a list of the screening recommended for you and due dates:  Health Maintenance  Topic Date Due   Zoster (Shingles) Vaccine (1 of 2) Never done   COVID-19 Vaccine (3 - Moderna risk series) 06/09/2019   Pneumonia Vaccine (2 of 2 - PCV) 05/06/2022   Flu Shot  08/12/2022   Medicare Annual Wellness Visit  05/24/2023   Mammogram  09/08/2023   Colon Cancer Screening  06/24/2028   DTaP/Tdap/Td vaccine (3 - Td or Tdap) 09/02/2030   DEXA scan (bone density measurement)  Completed   Hepatitis C Screening: USPSTF Recommendation to screen - Ages 14-79 yo.  Completed   HPV Vaccine  Aged Out    Advanced directives: no  Conditions/risks identified: low falls risk  Next appointment: Follow up in one year for your annual wellness visit 05/25/2023 @3pm  telephone   Preventive Care 65 Years and Older, Female Preventive care refers to lifestyle choices and visits with your health care provider that can promote health and wellness. What does preventive care include? A yearly physical exam. This is also called an annual well check. Dental exams once or twice a year. Routine eye exams. Ask your health care provider how often you should have your eyes checked. Personal lifestyle choices, including: Daily care of your teeth and gums. Regular physical activity. Eating a healthy diet. Avoiding tobacco and drug use. Limiting alcohol use. Practicing safe sex. Taking low-dose aspirin every day. Taking vitamin and mineral supplements as recommended by your health care provider. What happens during an annual well check? The services and screenings done by your health care provider during your annual well check  will depend on your age, overall health, lifestyle risk factors, and family history of disease. Counseling  Your health care provider may ask you questions about your: Alcohol use. Tobacco use. Drug use. Emotional well-being. Home and relationship well-being. Sexual activity. Eating habits. History of falls. Memory and ability to understand (cognition). Work and work Astronomer. Reproductive health. Screening  You may have the following tests or measurements: Height, weight, and BMI. Blood pressure. Lipid and cholesterol levels. These may be checked every 5 years, or more frequently if you are over 43 years old. Skin check. Lung cancer screening. You may have this screening every year starting at age 47 if you have a 30-pack-year history of smoking and currently smoke or have quit within the past 15 years. Fecal occult blood test (FOBT) of the stool. You may have this test every year starting at age 40. Flexible sigmoidoscopy or colonoscopy. You may have a sigmoidoscopy every 5 years or a colonoscopy every 10 years starting at age 56. Hepatitis C blood test. Hepatitis B blood test. Sexually transmitted disease (STD) testing. Diabetes screening. This is done by checking your blood sugar (glucose) after you have not eaten for a while (fasting). You may have this done every 1-3 years. Bone density scan. This is done to screen for osteoporosis. You may have this done starting at age 25. Mammogram. This may be done every 1-2 years. Talk to your health care provider about how often you should have regular mammograms. Talk with your health care  provider about your test results, treatment options, and if necessary, the need for more tests. Vaccines  Your health care provider may recommend certain vaccines, such as: Influenza vaccine. This is recommended every year. Tetanus, diphtheria, and acellular pertussis (Tdap, Td) vaccine. You may need a Td booster every 10 years. Zoster vaccine. You  may need this after age 52. Pneumococcal 13-valent conjugate (PCV13) vaccine. One dose is recommended after age 43. Pneumococcal polysaccharide (PPSV23) vaccine. One dose is recommended after age 5. Talk to your health care provider about which screenings and vaccines you need and how often you need them. This information is not intended to replace advice given to you by your health care provider. Make sure you discuss any questions you have with your health care provider. Document Released: 01/24/2015 Document Revised: 09/17/2015 Document Reviewed: 10/29/2014 Elsevier Interactive Patient Education  2017 Leslie Koch Prevention in the Home Falls can cause injuries. They can happen to people of all ages. There are many things you can do to make your home safe and to help prevent falls. What can I do on the outside of my home? Regularly fix the edges of walkways and driveways and fix any cracks. Remove anything that might make you trip as you walk through a door, such as a raised step or threshold. Trim any bushes or trees on the path to your home. Use bright outdoor lighting. Clear any walking paths of anything that might make someone trip, such as rocks or tools. Regularly check to see if handrails are loose or broken. Make sure that both sides of any steps have handrails. Any raised decks and porches should have guardrails on the edges. Have any leaves, snow, or ice cleared regularly. Use sand or salt on walking paths during winter. Clean up any spills in your garage right away. This includes oil or grease spills. What can I do in the bathroom? Use night lights. Install grab bars by the toilet and in the tub and shower. Do not use towel bars as grab bars. Use non-skid mats or decals in the tub or shower. If you need to sit down in the shower, use a plastic, non-slip stool. Keep the floor dry. Clean up any water that spills on the floor as soon as it happens. Remove soap buildup  in the tub or shower regularly. Attach bath mats securely with double-sided non-slip rug tape. Do not have throw rugs and other things on the floor that can make you trip. What can I do in the bedroom? Use night lights. Make sure that you have a light by your bed that is easy to reach. Do not use any sheets or blankets that are too big for your bed. They should not hang down onto the floor. Have a firm chair that has side arms. You can use this for support while you get dressed. Do not have throw rugs and other things on the floor that can make you trip. What can I do in the kitchen? Clean up any spills right away. Avoid walking on wet floors. Keep items that you use a lot in easy-to-reach places. If you need to reach something above you, use a strong step stool that has a grab bar. Keep electrical cords out of the way. Do not use floor polish or wax that makes floors slippery. If you must use wax, use non-skid floor wax. Do not have throw rugs and other things on the floor that can make you trip. What can I  do with my stairs? Do not leave any items on the stairs. Make sure that there are handrails on both sides of the stairs and use them. Fix handrails that are broken or loose. Make sure that handrails are as long as the stairways. Check any carpeting to make sure that it is firmly attached to the stairs. Fix any carpet that is loose or worn. Avoid having throw rugs at the top or bottom of the stairs. If you do have throw rugs, attach them to the floor with carpet tape. Make sure that you have a light switch at the top of the stairs and the bottom of the stairs. If you do not have them, ask someone to add them for you. What else can I do to help prevent falls? Wear shoes that: Do not have high heels. Have rubber bottoms. Are comfortable and fit you well. Are closed at the toe. Do not wear sandals. If you use a stepladder: Make sure that it is fully opened. Do not climb a closed  stepladder. Make sure that both sides of the stepladder are locked into place. Ask someone to hold it for you, if possible. Clearly mark and make sure that you can see: Any grab bars or handrails. First and last steps. Where the edge of each step is. Use tools that help you move around (mobility aids) if they are needed. These include: Canes. Walkers. Scooters. Crutches. Turn on the lights when you go into a dark area. Replace any light bulbs as soon as they burn out. Set up your furniture so you have a clear path. Avoid moving your furniture around. If any of your floors are uneven, fix them. If there are any pets around you, be aware of where they are. Review your medicines with your doctor. Some medicines can make you feel dizzy. This can increase your chance of falling. Ask your doctor what other things that you can do to help prevent falls. This information is not intended to replace advice given to you by your health care provider. Make sure you discuss any questions you have with your health care provider. Document Released: 10/24/2008 Document Revised: 06/05/2015 Document Reviewed: 02/01/2014 Elsevier Interactive Patient Education  2017 [employer].

## 2022-06-08 DIAGNOSIS — M1712 Unilateral primary osteoarthritis, left knee: Secondary | ICD-10-CM | POA: Diagnosis not present

## 2022-06-23 ENCOUNTER — Other Ambulatory Visit: Payer: Self-pay | Admitting: Family Medicine

## 2022-07-26 ENCOUNTER — Other Ambulatory Visit: Payer: Self-pay | Admitting: Family Medicine

## 2022-07-26 DIAGNOSIS — E7800 Pure hypercholesterolemia, unspecified: Secondary | ICD-10-CM

## 2022-08-09 ENCOUNTER — Telehealth: Payer: Medicare HMO | Admitting: Physician Assistant

## 2022-08-09 ENCOUNTER — Ambulatory Visit: Payer: Self-pay

## 2022-08-09 DIAGNOSIS — U071 COVID-19: Secondary | ICD-10-CM

## 2022-08-09 MED ORDER — PROMETHAZINE-DM 6.25-15 MG/5ML PO SYRP
5.0000 mL | ORAL_SOLUTION | Freq: Four times a day (QID) | ORAL | 0 refills | Status: DC | PRN
Start: 2022-08-09 — End: 2023-08-23

## 2022-08-09 MED ORDER — NIRMATRELVIR/RITONAVIR (PAXLOVID)TABLET
3.0000 | ORAL_TABLET | Freq: Two times a day (BID) | ORAL | 0 refills | Status: AC
Start: 2022-08-09 — End: 2022-08-14

## 2022-08-09 MED ORDER — ONDANSETRON 4 MG PO TBDP
4.0000 mg | ORAL_TABLET | Freq: Three times a day (TID) | ORAL | 0 refills | Status: DC | PRN
Start: 2022-08-09 — End: 2023-08-23

## 2022-08-09 NOTE — Telephone Encounter (Signed)
Noted  

## 2022-08-09 NOTE — Telephone Encounter (Signed)
Chief Complaint: Covid-19 positive  Symptoms: cough, headache, fatigue, chills, mild SOB, muscle aches Frequency: onset 2 days ago and gradually getting worse  Pertinent Negatives: Patient denies nausea, vomiting, fever Disposition: [] ED /[] Urgent Care (no appt availability in office) / [] Appointment(In office/virtual)/ [x]  [facility]/ [] Home Care/ [] Refused Recommended Disposition /[] [facility]/ []  Follow-up with PCP Additional Notes: Patient states her husband, mother and 80 year old grandson tested positive for covid a few days ago and she started to feel bad. Patient reports testing positive for Covid with a home test yesterday. Patient reports cough and headache to be her worse symptoms. Patient requested an Rx for symptoms. Advised she would need to be evaluated. Patient has been scheduled today with virtual urgent care at 1400.   Summary: cold and flu like symptoms / rx req   The patient has tested positive for COVID 19 via an at home test 08/08/22  The patient is experiencing a headache, cough, elevated temperature, congestion  The patient has been previously taking otc medication with little to no improvement  The patient would like to be prescribed something for their discomfort  Please contact further when possible     Reason for Disposition  [1] HIGH RISK patient (e.g., weak immune system, age > 64 years, obesity with BMI 30 or higher, pregnant, chronic lung disease or other chronic medical condition) AND [2] COVID symptoms (e.g., cough, fever)  (Exceptions: Already seen by PCP and no new or worsening symptoms.)  Answer Assessment - Initial Assessment Questions 1. COVID-19 DIAGNOSIS: "How do you know that you have COVID?" (e.g., positive lab test or self-test, diagnosed by doctor or NP/PA, symptoms after exposure).     Home test was positive yesterday 2. COVID-19 EXPOSURE: "Was there any known exposure to COVID before the symptoms began?" CDC Definition of  close contact: within 6 feet (2 meters) for a total of 15 minutes or more over a 24-hour period.      Yes my husband had it, mother and 10 year old grandson  3. ONSET: "When did the COVID-19 symptoms start?"      Saturday 4. WORST SYMPTOM: "What is your worst symptom?" (e.g., cough, fever, shortness of breath, muscle aches)     Headache and cough 5. COUGH: "Do you have a cough?" If Yes, ask: "How bad is the cough?"       Yes, its bad  6. FEVER: "Do you have a fever?" If Yes, ask: "What is your temperature, how was it measured, and when did it start?"     99.4 last night  7. RESPIRATORY STATUS: "Describe your breathing?" (e.g., normal; shortness of breath, wheezing, unable to speak)      Yes, I'm using my inhaler for that.  8. BETTER-SAME-WORSE: "Are you getting better, staying the same or getting worse compared to yesterday?"  If getting worse, ask, "In what way?"     Worse  9. OTHER SYMPTOMS: "Do you have any other symptoms?"  (e.g., chills, fatigue, headache, loss of smell or taste, muscle pain, sore throat)     Headache, muscle pain, loss of smell, chills, fatigue  10. HIGH RISK DISEASE: "Do you have any chronic medical problems?" (e.g., asthma, heart or lung disease, weak immune system, obesity, etc.)       Yes, covid aquired asthma  11. VACCINE: "Have you had the COVID-19 vaccine?" If Yes, ask: "Which one, how many shots, when did you get it?"       Yes, 2 vaccines  I have not had any booster  Protocols used: Coronavirus (COVID-19) Diagnosed or Suspected-A-AH

## 2022-08-09 NOTE — Patient Instructions (Signed)
[name], thank you for joining [name], [name] for today's virtual visit.  While this provider is not your primary care provider (PCP), if your PCP is located in our provider database this encounter information will be shared with them immediately following your visit.   A [facility] MyChart account gives you access to today's visit and all your visits, tests, and labs performed at [facility] " click here if you don't have a [facility] MyChart account or go to [email or web address]  Consent: (Patient) Leslie Koch provided verbal consent for this virtual visit at the beginning of the encounter.  Current Medications:  Current Outpatient Medications:    albuterol (VENTOLIN HFA) 108 (90 Base) MCG/ACT inhaler, Inhale 2 puffs into the lungs every 6 (six) hours as needed for wheezing or shortness of breath., Disp: 8 g, Rfl: 2   aspirin EC 81 MG tablet, Take 81 mg by mouth daily. , Disp: , Rfl:    Calcium Carbonate-Vitamin D (CALCIUM 500/D PO), Take 1 tablet by mouth daily., Disp: , Rfl:    celecoxib (CELEBREX) 200 MG capsule, Take 1 capsule (200 mg total) by mouth daily., Disp: 90 capsule, Rfl: 4   cetirizine (ZYRTEC) 10 MG tablet, Take 10 mg by mouth daily. , Disp: , Rfl:    Cholecalciferol (VITAMIN D3) 1000 units CAPS, Take 1,000 Units by mouth in the morning and at bedtime., Disp: , Rfl:    estradiol (ESTRACE) 0.1 MG/GM vaginal cream, Place 1 Applicatorful vaginally 3 (three) times a week., Disp: 42.5 g, Rfl: 12   estradiol (ESTRACE) 1 MG tablet, Take 2 tablets (2 mg total) by mouth daily., Disp: 180 tablet, Rfl: 4   fluticasone (FLONASE) 50 MCG/ACT nasal spray, Place 2 sprays into both nostrils daily., Disp: , Rfl:    furosemide (LASIX) 40 MG tablet, TAKE 1 TABLET BY MOUTH TWICE A DAY AS NEEDED (Patient taking differently: Once daily as needed), Disp: 180 tablet, Rfl: 1   Glucosamine-Chondroitin 750-600 MG TABS, Take 1 tablet by mouth in the morning and at  bedtime., Disp: , Rfl:    lisinopril-hydrochlorothiazide (ZESTORETIC) 20-12.5 MG tablet, Take 1 tablet by mouth daily., Disp: 90 tablet, Rfl: 4   nirmatrelvir/ritonavir (PAXLOVID) 20 x 150 MG & 10 x 100MG  TABS, Take 3 tablets by mouth 2 (two) times daily for 5 days. (Take nirmatrelvir 150 mg two tablets twice daily for 5 days and ritonavir 100 mg one tablet twice daily for 5 days) Patient GFR is 82, Disp: 30 tablet, Rfl: 0   Omega-3 1000 MG CAPS, Take 2,000 mg by mouth daily., Disp: , Rfl:    ondansetron (ZOFRAN-ODT) 4 MG disintegrating tablet, Take 1 tablet (4 mg total) by mouth every 8 (eight) hours as needed., Disp: 20 tablet, Rfl: 0   oxymetazoline (AFRIN) 0.05 % nasal spray, Place 1-2 sprays into both nostrils 2 (two) times daily as needed for congestion., Disp: , Rfl:    potassium chloride (K-DUR) 10 MEQ tablet, Take 1 tablet (10 mEq total) by mouth 2 (two) times daily as needed., Disp: 60 tablet, Rfl: 4   promethazine-dextromethorphan (PROMETHAZINE-DM) 6.25-15 MG/5ML syrup, Take 5 mLs by mouth 4 (four) times daily as needed., Disp: 118 mL, Rfl: 0   senna-docusate (SENOKOT-S) 8.6-50 MG tablet, Take 1 tablet by mouth at bedtime., Disp: , Rfl:    simvastatin (ZOCOR) 40 MG tablet, TAKE 1 TABLET BY MOUTH EVERYDAY AT BEDTIME, Disp: 90 tablet, Rfl: 3   valACYclovir (VALTREX) 1000 MG tablet, Take 1-2 tablets (1,000-2,000  mg total) by mouth every 12 (twelve) hours as needed. (Patient taking differently: Take 1,000-2,000 mg by mouth every 12 (twelve) hours as needed (PRN when cold sores appear).), Disp: 28 tablet, Rfl: 0   Medications ordered in this encounter:  Meds ordered this encounter  Medications   nirmatrelvir/ritonavir (PAXLOVID) 20 x 150 MG & 10 x 100MG  TABS    Sig: Take 3 tablets by mouth 2 (two) times daily for 5 days. (Take nirmatrelvir 150 mg two tablets twice daily for 5 days and ritonavir 100 mg one tablet twice daily for 5 days) Patient GFR is 82    Dispense:  30 tablet    Refill:  0     Order Specific Question:   Supervising Provider    Answer:   Merrilee Jansky [[id number]]   ondansetron (ZOFRAN-ODT) 4 MG disintegrating tablet    Sig: Take 1 tablet (4 mg total) by mouth every 8 (eight) hours as needed.    Dispense:  20 tablet    Refill:  0    Order Specific Question:   Supervising Provider    Answer:   Merrilee Jansky [id number]   promethazine-dextromethorphan (PROMETHAZINE-DM) 6.25-15 MG/5ML syrup    Sig: Take 5 mLs by mouth 4 (four) times daily as needed.    Dispense:  118 mL    Refill:  0    Order Specific Question:   Supervising Provider    Answer:   Merrilee Jansky [[id number]]     *If you need refills on other medications prior to your next appointment, please contact your pharmacy*  Follow-Up: Call back or seek an in-person evaluation if the symptoms worsen or if the condition fails to improve as anticipated.  [facility] [phone]  Care Instructions: Paxlovid (Nirmatrelvir; Ritonavir) Tablets What is this medication? NIRMATRELVIR; RITONAVIR (NIR ma TREL vir; ri TOE na veer) treats mild to moderate COVID-19. It may help people who are at high risk of developing severe illness. It works by limiting the spread of the virus in your body. This medicine may be used for other purposes; ask your health care provider or pharmacist if you have questions. COMMON BRAND NAME(S): PAXLOVID What should I tell my care team before I take this medication? They need to know if you have any of these conditions: Any allergies Any serious illness Kidney disease Liver disease An unusual or allergic reaction to nirmatrelvir, ritonavir, other medications, foods, dyes, or preservatives Pregnant or trying to get pregnant Breast-feeding How should I use this medication? This product contains 2 different medications that are packaged together. For the standard dose, take 2 pink tablets of nirmatrelvir with 1 white tablet of ritonavir (3 tablets total) by mouth  with water twice daily. Talk to your care team if you have kidney disease. You may need a different dose. Swallow the tablets whole. You can take it with or without food. If it upsets your stomach, take it with food. Take all of this medication unless your care team tells you to stop it early. Keep taking it even if you think you are better. Talk to your care team about the use of this medication in children. While it may be prescribed for children as young as 12 years for selected conditions, precautions do apply. Overdosage: If you think you have taken too much of this medicine contact a poison control center or emergency room at once. NOTE: This medicine is only for you. Do not share this medicine with others. What if  I miss a dose? If you miss a dose, take it as soon as you can unless it is more than 8 hours late. If it is more than 8 hours late, skip the missed dose. Take the next dose at the normal time. Do not take extra or 2 doses at the same time to make up for the missed dose. What may interact with this medication? Do not take this medication with any of the following: Alfuzosin Certain medications for anxiety or sleep, such as midazolam or triazolam Certain medications for cancer, such as apalutamide Certain medications for cholesterol, such as lovastatin or simvastatin Certain medications for irregular heartbeat, such as amiodarone, dronedarone, flecainide, propafenone, quinidine Certain medications for mental health conditions, such as lurasidone or pimozide Certain medications for seizures, such as carbamazepine, phenobarbital, phenytoin, primidone Colchicine Eletriptan Eplerenone Ergot alkaloids, such as dihydroergotamine, ergotamine, methylergonovine Finerenone Flibanserin Ivabradine Lomitapide Lumacaftor; ivacaftor Naloxegol Ranolazine Red Yeast Rice Rifampin Rifapentine Sildenafil Silodosin St. John's wort Tolvaptan Ubrogepant Voclosporin This medication may  affect how other medications work, and other medications may affect the way this medication works. Talk with your care team about all of the medications you take. They may suggest changes to your treatment plan to lower the risk of side effects and to make sure your medications work as intended. This list may not describe all possible interactions. Give your health care provider a list of all the medicines, herbs, non-prescription drugs, or dietary supplements you use. Also tell them if you smoke, drink alcohol, or use illegal drugs. Some items may interact with your medicine. What should I watch for while using this medication? Your condition will be monitored carefully while you are receiving this medication. Visit your care team for regular checkups. Tell your care team if your symptoms do not start to get better or if they get worse. If you have untreated HIV infection, this medication may lead to some HIV medications not working as well in the future. Estrogen and progestin hormones may not work as well while you are taking this medication. Your care team can help you find the contraceptive option that works for you. What side effects may I notice from receiving this medication? Side effects that you should report to your care team as soon as possible: Allergic reactions--skin rash, itching, hives, swelling of the face, lips, tongue, or throat Liver injury--right upper belly pain, loss of appetite, nausea, light-colored stool, dark yellow or brown urine, yellowing skin or eyes, unusual weakness or fatigue Redness, blistering, peeling, or loosening of the skin, including inside the mouth Side effects that usually do not require medical attention (report these to your care team if they continue or are bothersome): Change in taste Diarrhea General discomfort and fatigue Increase in blood pressure Muscle pain Nausea Stomach pain This list may not describe all possible side effects. Call your  doctor for medical advice about side effects. You may report side effects to FDA at 1-800-FDA-1088. Where should I keep my medication? Keep out of the reach of children and pets. Store at room temperature between 20 and 25 degrees C (68 and 77 degrees F). Get rid of any unused medication after the expiration date. To get rid of medications that are no longer needed or have expired: Take the medication to a medication take-back program. Check with your pharmacy or law enforcement to find a location. If you cannot return the medication, check the label or package insert to see if the medication should be thrown out in  the garbage or flushed down the toilet. If you are not sure, ask your care team. If it is safe to put it in the trash, take the medication out of the container. Mix the medication with cat litter, dirt, coffee grounds, or other unwanted substance. Seal the mixture in a bag or container. Put it in the trash. NOTE: This sheet is a summary. It may not cover all possible information. If you have questions about this medicine, talk to your doctor, pharmacist, or health care provider.  2024 Elsevier/Gold Standard (2022-02-15 00:00:00)    Isolation Instructions: You are to isolate at home until you have been fever free for at least 24 hours without a fever-reducing medication, and symptoms have been steadily improving for 24 hours. At that time,  you can end isolation but need to mask for an additional 5 days.   If you must be around other household members who do not have symptoms, you need to make sure that both you and the family members are masking consistently with a high-quality mask.  If you note any worsening of symptoms despite treatment, please seek an in-person evaluation ASAP. If you note any significant shortness of breath or any chest pain, please seek ER evaluation. Please do not delay care!   COVID-19: What to Do if You Are Sick If you test positive and are an older adult or  someone who is at high risk of getting very sick from COVID-19, treatment may be available. Contact a healthcare provider right away after a positive test to determine if you are eligible, even if your symptoms are mild right now. You can also visit a Test to Treat location and, if eligible, receive a prescription from a provider. Don't delay: Treatment must be started within the first few days to be effective. If you have a fever, cough, or other symptoms, you might have COVID-19. Most people have mild illness and are able to recover at home. If you are sick: Keep track of your symptoms. If you have an emergency warning sign (including trouble breathing), call 911. Steps to help prevent the spread of COVID-19 if you are sick If you are sick with COVID-19 or think you might have COVID-19, follow the steps below to care for yourself and to help protect other people in your home and community. Stay home except to get medical care Stay home. Most people with COVID-19 have mild illness and can recover at home without medical care. Do not leave your home, except to get medical care. Do not visit public areas and do not go to places where you are unable to wear a mask. Take care of yourself. Get rest and stay hydrated. Take over-the-counter medicines, such as acetaminophen, to help you feel better. Stay in touch with your doctor. Call before you get medical care. Be sure to get care if you have trouble breathing, or have any other emergency warning signs, or if you think it is an emergency. Avoid public transportation, ride-sharing, or taxis if possible. Get tested If you have symptoms of COVID-19, get tested. While waiting for test results, stay away from others, including staying apart from those living in your household. Get tested as soon as possible after your symptoms start. Treatments may be available for people with COVID-19 who are at risk for becoming very sick. Don't delay: Treatment must be  started early to be effective--some treatments must begin within 5 days of your first symptoms. Contact your healthcare provider right away if your  test result is positive to determine if you are eligible. Self-tests are one of several options for testing for the virus that causes COVID-19 and may be more convenient than laboratory-based tests and point-of-care tests. Ask your healthcare provider or your local health department if you need help interpreting your test results. You can visit your state, tribal, local, and territorial health department's website to look for the latest local information on testing sites. Separate yourself from other people As much as possible, stay in a specific room and away from other people and pets in your home. If possible, you should use a separate bathroom. If you need to be around other people or animals in or outside of the home, wear a well-fitting mask. Tell your close contacts that they may have been exposed to COVID-19. An infected person can spread COVID-19 starting 48 hours (or 2 days) before the person has any symptoms or tests positive. By letting your close contacts know they may have been exposed to COVID-19, you are helping to protect everyone. See COVID-19 and Animals if you have questions about pets. If you are diagnosed with COVID-19, someone from the health department may call you. Answer the call to slow the spread. Monitor your symptoms Symptoms of COVID-19 include fever, cough, or other symptoms. Follow care instructions from your healthcare provider and local health department. Your local health authorities may give instructions on checking your symptoms and reporting information. When to seek emergency medical attention Look for emergency warning signs* for COVID-19. If someone is showing any of these signs, seek emergency medical care immediately: Trouble breathing Persistent pain or pressure in the chest New confusion Inability to wake or  stay awake Pale, gray, or blue-colored skin, lips, or nail beds, depending on skin tone *This list is not all possible symptoms. Please call your medical provider for any other symptoms that are severe or concerning to you. Call 911 or call ahead to your local emergency facility: Notify the operator that you are seeking care for someone who has or may have COVID-19. Call ahead before visiting your doctor Call ahead. Many medical visits for routine care are being postponed or done by phone or telemedicine. If you have a medical appointment that cannot be postponed, call your doctor's office, and tell them you have or may have COVID-19. This will help the office protect themselves and other patients. If you are sick, wear a well-fitting mask You should wear a mask if you must be around other people or animals, including pets (even at home). Wear a mask with the best fit, protection, and comfort for you. You don't need to wear the mask if you are alone. If you can't put on a mask (because of trouble breathing, for example), cover your coughs and sneezes in some other way. Try to stay at least 6 feet away from other people. This will help protect the people around you. Masks should not be placed on young children under age 81 years, anyone who has trouble breathing, or anyone who is not able to remove the mask without help. Cover your coughs and sneezes Cover your mouth and nose with a tissue when you cough or sneeze. Throw away used tissues in a lined trash can. Immediately wash your hands with soap and water for at least 20 seconds. If soap and water are not available, clean your hands with an alcohol-based hand sanitizer that contains at least 60% alcohol. Clean your hands often Wash your hands often with soap and  water for at least 20 seconds. This is especially important after blowing your nose, coughing, or sneezing; going to the bathroom; and before eating or preparing food. Use hand sanitizer if  soap and water are not available. Use an alcohol-based hand sanitizer with at least 60% alcohol, covering all surfaces of your hands and rubbing them together until they feel dry. Soap and water are the best option, especially if hands are visibly dirty. Avoid touching your eyes, nose, and mouth with unwashed hands. Handwashing Tips Avoid sharing personal household items Do not share dishes, drinking glasses, cups, eating utensils, towels, or bedding with other people in your home. Wash these items thoroughly after using them with soap and water or put in the dishwasher. Clean surfaces in your home regularly Clean and disinfect high-touch surfaces (for example, doorknobs, tables, handles, light switches, and countertops) in your "sick room" and bathroom. In shared spaces, you should clean and disinfect surfaces and items after each use by the person who is ill. If you are sick and cannot clean, a caregiver or other person should only clean and disinfect the area around you (such as your bedroom and bathroom) on an as needed basis. Your caregiver/other person should wait as long as possible (at least several hours) and wear a mask before entering, cleaning, and disinfecting shared spaces that you use. Clean and disinfect areas that may have blood, stool, or body fluids on them. Use household cleaners and disinfectants. Clean visible dirty surfaces with household cleaners containing soap or detergent. Then, use a household disinfectant. Use a product from [employer] List N: Disinfectants for Coronavirus (COVID-19). Be sure to follow the instructions on the label to ensure safe and effective use of the product. Many products recommend keeping the surface wet with a disinfectant for a certain period of time (look at "contact time" on the product label). You may also need to wear personal protective equipment, such as gloves, depending on the directions on the product label. Immediately after disinfecting, wash  your hands with soap and water for 20 seconds. For completed guidance on cleaning and disinfecting your home, visit Complete Disinfection Guidance. Take steps to improve ventilation at home Improve ventilation (air flow) at home to help prevent from spreading COVID-19 to other people in your household. Clear out COVID-19 virus particles in the air by opening windows, using air filters, and turning on fans in your home. Use this interactive tool to learn how to improve air flow in your home. When you can be around others after being sick with COVID-19 Deciding when you can be around others is different for different situations. Find out when you can safely end home isolation. For any additional questions about your care, contact your healthcare provider or state or local health department. 04/01/2020 Content source: [facility] for Immunization and Respiratory Diseases ([facility]), Division of Viral Diseases This information is not intended to replace advice given to you by your health care provider. Make sure you discuss any questions you have with your health care provider. Document Revised: 05/15/2020 Document Reviewed: 05/15/2020 Elsevier Patient Education  2022 [employer].     If you have been instructed to have an in-person evaluation today at a local Urgent Care facility, please use the link below. It will take you to a list of all of our available [facility], including address, phone number and hours of operation. Please do not delay care.  [facility]  If you or a family member do  not have a primary care provider, use the link below to schedule a visit and establish care. When you choose a [facility] primary care physician or advanced practice provider, you gain a long-term partner in health. Find a Primary Care Provider  Learn more about [facility]'s in-office and virtual care options: [facility] - Get Care Now

## 2022-08-09 NOTE — Progress Notes (Signed)
Virtual Visit Consent   LYNESSA Koch, you are scheduled for a virtual visit with a [facility] provider today. Just as with appointments in the office, your consent must be obtained to participate. Your consent will be active for this visit and any virtual visit you may have with one of our providers in the next 365 days. If you have a MyChart account, a copy of this consent can be sent to you electronically.  As this is a virtual visit, video technology does not allow for your provider to perform a traditional examination. This may limit your provider's ability to fully assess your condition. If your provider identifies any concerns that need to be evaluated in person or the need to arrange testing (such as labs, EKG, etc.), we will make arrangements to do so. Although advances in technology are sophisticated, we cannot ensure that it will always work on either your end or our end. If the connection with a video visit is poor, the visit may have to be switched to a telephone visit. With either a video or telephone visit, we are not always able to ensure that we have a secure connection.  By engaging in this virtual visit, you consent to the provision of healthcare and authorize for your insurance to be billed (if applicable) for the services provided during this visit. Depending on your insurance coverage, you may receive a charge related to this service.  I need to obtain your verbal consent now. Are you willing to proceed with your visit today? Leslie Koch has provided verbal consent on 08/09/2022 for a virtual visit (video or telephone). [name], [name]  Date: 08/09/2022 2:27 PM  Virtual Visit via Video Note   I, [name], connected with  Leslie Koch  ([id number], 67 y.o.) on 08/09/22 at  2:00 PM EDT by a video-enabled telemedicine application and verified that I am speaking with the correct person using two identifiers.  Location: Patient: Virtual Visit Location  Patient: Home Provider: Virtual Visit Location Provider: Home Office   I discussed the limitations of evaluation and management by telemedicine and the availability of in person appointments. The patient expressed understanding and agreed to proceed.    History of Present Illness: Leslie Koch is a 67 y.o. who identifies as a female who was assigned female at birth, and is being seen today for Covid 51.  HPI: URI  This is a new problem. The current episode started in the past 7 days (Saturday, 08/07/22). The maximum temperature recorded prior to her arrival was 100.4 - 100.9 F. The fever has been present for 1 to 2 days. Associated symptoms include congestion, coughing, headaches, nausea, rhinorrhea, sinus pain, a sore throat and wheezing. Pertinent negatives include no diarrhea, ear pain, plugged ear sensation or vomiting. She has tried inhaler use (mucinex, delsym, robitussin) for the symptoms. The treatment provided no relief.     Problems:  Patient Active Problem List   Diagnosis Date Noted   Snoring 05/11/2022   Fatigue due to exposure 05/11/2022   Arthralgia 05/11/2022   Screening mammogram for breast cancer 05/11/2022   Annual physical exam 05/11/2022   Encounter for subsequent annual wellness visit (AWV) in Medicare patient 05/11/2022   Adenomatous polyp of ascending colon    Screening for colon cancer 05/05/2021   Chronic pain of left knee 05/05/2021   Vaginal dryness 05/05/2021   Primary hypertension 05/05/2021   Hypercholesterolemia 05/05/2021   Bilateral hand numbness 09/01/2020   Morbid obesity (  HCC) 09/01/2020   Cannot sleep 08/06/2014   Avitaminosis D 08/06/2014    Allergies:  Allergies  Allergen Reactions   Augmentin [Amoxicillin-Pot Clavulanate]     diarrhea   Montelukast Sodium Rash   Medications:  Current Outpatient Medications:    albuterol (VENTOLIN HFA) 108 (90 Base) MCG/ACT inhaler, Inhale 2 puffs into the lungs every 6 (six) hours as needed for  wheezing or shortness of breath., Disp: 8 g, Rfl: 2   aspirin EC 81 MG tablet, Take 81 mg by mouth daily. , Disp: , Rfl:    Calcium Carbonate-Vitamin D (CALCIUM 500/D PO), Take 1 tablet by mouth daily., Disp: , Rfl:    celecoxib (CELEBREX) 200 MG capsule, Take 1 capsule (200 mg total) by mouth daily., Disp: 90 capsule, Rfl: 4   cetirizine (ZYRTEC) 10 MG tablet, Take 10 mg by mouth daily. , Disp: , Rfl:    Cholecalciferol (VITAMIN D3) 1000 units CAPS, Take 1,000 Units by mouth in the morning and at bedtime., Disp: , Rfl:    estradiol (ESTRACE) 0.1 MG/GM vaginal cream, Place 1 Applicatorful vaginally 3 (three) times a week., Disp: 42.5 g, Rfl: 12   estradiol (ESTRACE) 1 MG tablet, Take 2 tablets (2 mg total) by mouth daily., Disp: 180 tablet, Rfl: 4   fluticasone (FLONASE) 50 MCG/ACT nasal spray, Place 2 sprays into both nostrils daily., Disp: , Rfl:    furosemide (LASIX) 40 MG tablet, TAKE 1 TABLET BY MOUTH TWICE A DAY AS NEEDED (Patient taking differently: Once daily as needed), Disp: 180 tablet, Rfl: 1   Glucosamine-Chondroitin 750-600 MG TABS, Take 1 tablet by mouth in the morning and at bedtime., Disp: , Rfl:    lisinopril-hydrochlorothiazide (ZESTORETIC) 20-12.5 MG tablet, Take 1 tablet by mouth daily., Disp: 90 tablet, Rfl: 4   nirmatrelvir/ritonavir (PAXLOVID) 20 x 150 MG & 10 x 100MG  TABS, Take 3 tablets by mouth 2 (two) times daily for 5 days. (Take nirmatrelvir 150 mg two tablets twice daily for 5 days and ritonavir 100 mg one tablet twice daily for 5 days) Patient GFR is 82, Disp: 30 tablet, Rfl: 0   Omega-3 1000 MG CAPS, Take 2,000 mg by mouth daily., Disp: , Rfl:    ondansetron (ZOFRAN-ODT) 4 MG disintegrating tablet, Take 1 tablet (4 mg total) by mouth every 8 (eight) hours as needed., Disp: 20 tablet, Rfl: 0   oxymetazoline (AFRIN) 0.05 % nasal spray, Place 1-2 sprays into both nostrils 2 (two) times daily as needed for congestion., Disp: , Rfl:    potassium chloride (K-DUR) 10 MEQ  tablet, Take 1 tablet (10 mEq total) by mouth 2 (two) times daily as needed., Disp: 60 tablet, Rfl: 4   promethazine-dextromethorphan (PROMETHAZINE-DM) 6.25-15 MG/5ML syrup, Take 5 mLs by mouth 4 (four) times daily as needed., Disp: 118 mL, Rfl: 0   senna-docusate (SENOKOT-S) 8.6-50 MG tablet, Take 1 tablet by mouth at bedtime., Disp: , Rfl:    simvastatin (ZOCOR) 40 MG tablet, TAKE 1 TABLET BY MOUTH EVERYDAY AT BEDTIME, Disp: 90 tablet, Rfl: 3   valACYclovir (VALTREX) 1000 MG tablet, Take 1-2 tablets (1,000-2,000 mg total) by mouth every 12 (twelve) hours as needed. (Patient taking differently: Take 1,000-2,000 mg by mouth every 12 (twelve) hours as needed (PRN when cold sores appear).), Disp: 28 tablet, Rfl: 0  Observations/Objective: Patient is well-developed, well-nourished in no acute distress.  Resting comfortably at home.  Head is normocephalic, atraumatic.  No labored breathing.  Speech is clear and coherent with logical content.  Patient  is alert and oriented at baseline.    Assessment and Plan: 1. COVID-19 - nirmatrelvir/ritonavir (PAXLOVID) 20 x 150 MG & 10 x 100MG  TABS; Take 3 tablets by mouth 2 (two) times daily for 5 days. (Take nirmatrelvir 150 mg two tablets twice daily for 5 days and ritonavir 100 mg one tablet twice daily for 5 days) Patient GFR is 82  Dispense: 30 tablet; Refill: 0 - ondansetron (ZOFRAN-ODT) 4 MG disintegrating tablet; Take 1 tablet (4 mg total) by mouth every 8 (eight) hours as needed.  Dispense: 20 tablet; Refill: 0 - promethazine-dextromethorphan (PROMETHAZINE-DM) 6.25-15 MG/5ML syrup; Take 5 mLs by mouth 4 (four) times daily as needed.  Dispense: 118 mL; Refill: 0 - MyChart COVID-19 home monitoring program; Future  - Continue OTC symptomatic management of choice - Will send OTC vitamins and supplement information through AVS - Paxlovid, Zofran, and Promethazine DM prescribed - Patient enrolled in MyChart symptom monitoring - Push fluids - Rest as  needed - Discussed return precautions and when to seek in-person evaluation, sent via AVS as well   Follow Up Instructions: I discussed the assessment and treatment plan with the patient. The patient was provided an opportunity to ask questions and all were answered. The patient agreed with the plan and demonstrated an understanding of the instructions.  A copy of instructions were sent to the patient via MyChart unless otherwise noted below.    The patient was advised to call back or seek an in-person evaluation if the symptoms worsen or if the condition fails to improve as anticipated.  Time:  I spent 12 minutes with the patient via telehealth technology discussing the above problems/concerns.    [name], [name]

## 2022-09-20 ENCOUNTER — Ambulatory Visit (INDEPENDENT_AMBULATORY_CARE_PROVIDER_SITE_OTHER): Payer: Medicare HMO | Admitting: Family Medicine

## 2022-09-20 ENCOUNTER — Ambulatory Visit: Payer: Self-pay

## 2022-09-20 ENCOUNTER — Encounter: Payer: Self-pay | Admitting: Family Medicine

## 2022-09-20 VITALS — BP 138/76 | HR 84 | Ht 61.0 in | Wt 220.0 lb

## 2022-09-20 DIAGNOSIS — B999 Unspecified infectious disease: Secondary | ICD-10-CM

## 2022-09-20 MED ORDER — AMOXICILLIN-POT CLAVULANATE 875-125 MG PO TABS: 1.0000 | ORAL_TABLET | Freq: Two times a day (BID) | ORAL | 0 refills | Status: DC

## 2022-09-20 NOTE — Telephone Encounter (Signed)
  Chief Complaint: Infected cat bite right thumb, Unsure of last tetanus Symptoms: above Frequency: Bite was Saturday Pertinent Negatives: Patient denies fever Disposition: [] ED /[] Urgent Care (no appt availability in office) / [x] Appointment(In office/virtual)/ []  [facility]/ [] Home Care/ [] Refused Recommended Disposition /[] [facility]/ []  Follow-up with PCP Additional Notes: Pt was bathing her vaccinated cat on Saturday when cat bit her right thumb. Thumb is red and infected. Unsure of last tetanus shot.  Please advise if Dr. Sherrie Mustache is OOO.    Reason for Disposition  [1] Puncture wound (hole through the skin) AND [2] from a cat bite (or deep claw puncture wound)  Answer Assessment - Initial Assessment Questions 1. ANIMAL: "What type of animal caused the bite?" "Is the injury from a bite or a claw?" If the animal is a dog or a cat, ask: "Was it a pet or a stray?" "Was it acting ill or behaving strangely?"     Cat - giving the cat a bath 2. LOCATION: "Where is the bite located?"      Right thumb 3. SIZE: "How big is the bite?" "What does it look like?"      Ouncture wounds 4. ONSET: "When did the bite happen?" (Minutes or hours ago)      Bathing cat 5. CIRCUMSTANCES: "Tell me how this happened."      Cat bath 6. TETANUS: "When was your last tetanus booster?"     Do not remember 7. RABIES VACCINE: For dog or cat bites, ask: "Do you know if the pet is vaccinated against rabies?"  (e.g., yes, no, overdue for rabies shot, unknown)     yes  Protocols used: Animal Bite-A-AH

## 2022-09-20 NOTE — Progress Notes (Signed)
Established patient visit   Patient: Leslie Koch   DOB: March 06, 1955   67 y.o. Female  MRN: [id number] Visit Date: 09/20/2022  Today's healthcare provider: Mila Merry, MD   Chief Complaint  Patient presents with   Medical Management of Chronic Issues    Has upper respiratory infection, was bitten on Saturday on right thumb, pus oozing pus, started on Sunday, minimal drainage, has treated area with soap and water, iodine, peroxide, and antibiotic ointment    Subjective    Discussed the use of AI scribe software for clinical note transcription with the patient, who gave verbal consent to proceed.  History of Present Illness   Leslie Koch, a Investment banker, operational, presents with a cat bite on her thumb that occurred 2 days ago. Following the incident, she noticed significant swelling and signs of infection within a day. Despite self-care measures including cleaning with soap and water, antiseptic spray, iodine, and triple antibiotic ointment, the wound continues to drain when pressure is applied. She denies any known allergies to antibiotics. She had a tetanus shot in 2022 and is not due for another. She has not previously taken Augmentin, which is being considered for treatment.       Medications: Outpatient Medications Prior to Visit  Medication Sig   albuterol (VENTOLIN HFA) 108 (90 Base) MCG/ACT inhaler Inhale 2 puffs into the lungs every 6 (six) hours as needed for wheezing or shortness of breath.   aspirin EC 81 MG tablet Take 81 mg by mouth daily.    Calcium Carbonate-Vitamin D (CALCIUM 500/D PO) Take 1 tablet by mouth daily.   celecoxib (CELEBREX) 200 MG capsule Take 1 capsule (200 mg total) by mouth daily.   cetirizine (ZYRTEC) 10 MG tablet Take 10 mg by mouth daily.    Cholecalciferol (VITAMIN D3) 1000 units CAPS Take 1,000 Units by mouth in the morning and at bedtime.   dextromethorphan-guaiFENesin (MUCINEX DM) 30-600 MG 12hr tablet Take 1 tablet by mouth 4 (four) times daily.  Take 2 in the morning and 2 nightly   estradiol (ESTRACE) 0.1 MG/GM vaginal cream Place 1 Applicatorful vaginally 3 (three) times a week.   estradiol (ESTRACE) 1 MG tablet Take 2 tablets (2 mg total) by mouth daily.   fluticasone (FLONASE) 50 MCG/ACT nasal spray Place 2 sprays into both nostrils daily.   furosemide (LASIX) 40 MG tablet TAKE 1 TABLET BY MOUTH TWICE A DAY AS NEEDED (Patient taking differently: Once daily as needed)   Glucosamine-Chondroitin 750-600 MG TABS Take 1 tablet by mouth in the morning and at bedtime.   lisinopril-hydrochlorothiazide (ZESTORETIC) 20-12.5 MG tablet Take 1 tablet by mouth daily.   Omega-3 1000 MG CAPS Take 2,000 mg by mouth daily.   oxymetazoline (AFRIN) 0.05 % nasal spray Place 1-2 sprays into both nostrils 2 (two) times daily as needed for congestion.   potassium chloride (K-DUR) 10 MEQ tablet Take 1 tablet (10 mEq total) by mouth 2 (two) times daily as needed.   simvastatin (ZOCOR) 40 MG tablet TAKE 1 TABLET BY MOUTH EVERYDAY AT BEDTIME   valACYclovir (VALTREX) 1000 MG tablet Take 1-2 tablets (1,000-2,000 mg total) by mouth every 12 (twelve) hours as needed. (Patient taking differently: Take 1,000-2,000 mg by mouth every 12 (twelve) hours as needed (PRN when cold sores appear).)   ondansetron (ZOFRAN-ODT) 4 MG disintegrating tablet Take 1 tablet (4 mg total) by mouth every 8 (eight) hours as needed. (Patient not taking: Reported on 09/20/2022)   promethazine-dextromethorphan (PROMETHAZINE-DM) 6.25-15  MG/5ML syrup Take 5 mLs by mouth 4 (four) times daily as needed. (Patient not taking: Reported on 09/20/2022)   senna-docusate (SENOKOT-S) 8.6-50 MG tablet Take 1 tablet by mouth at bedtime. (Patient not taking: Reported on 09/20/2022)   No facility-administered medications prior to visit.   Review of Systems  Constitutional:  Negative for chills, fatigue and fever.  Gastrointestinal:  Negative for nausea and vomiting.       Objective    BP 138/76 (BP  Location: Left Arm, Patient Position: Sitting, Cuff Size: Large)   Pulse 84   Ht 5\' 1"  (1.549 m)   Wt 220 lb (99.8 kg)   SpO2 99%   BMI 41.57 kg/m   Physical Exam  Physical Exam   SKIN: Thumb shows signs of healing from a cat bite, with residual swelling and evidence of prior infection. Exudate present upon application of pressure.    No results found for any visits on 09/20/22.  Assessment & Plan        Cat Bite Infected wound on thumb with purulent drainage. Patient has been applying various topical treatments. Tetanus immunization up to date (last received September 01, 2020). -Send wound swab for culture. -Prescribe Augmentin, advise patient to take with food to minimize gastrointestinal side effects. She does report this medication causing diarrhea in the past, but is willing to try again, and will try taking along with probiotics.         Mila Merry, MD  [facility] [phone] (phone) [phone] (fax)  [facility]

## 2022-09-24 LAB — AEROBIC CULTURE

## 2022-10-06 ENCOUNTER — Other Ambulatory Visit: Payer: Self-pay | Admitting: Family Medicine

## 2022-10-06 DIAGNOSIS — U099 Post covid-19 condition, unspecified: Secondary | ICD-10-CM

## 2022-10-06 DIAGNOSIS — R600 Localized edema: Secondary | ICD-10-CM

## 2022-10-07 NOTE — Telephone Encounter (Signed)
Requested Prescriptions  Pending Prescriptions Disp Refills   albuterol (VENTOLIN HFA) 108 (90 Base) MCG/ACT inhaler [Pharmacy Med Name: ALBUTEROL HFA (VENTOLIN) INH] 18 each 2    Sig: TAKE 2 PUFFS BY MOUTH EVERY 6 HOURS AS NEEDED FOR WHEEZE OR SHORTNESS OF BREATH     Pulmonology:  Beta Agonists 2 Passed - 10/06/2022  7:29 PM      Passed - Last BP in normal range    BP Readings from Last 1 Encounters:  09/20/22 138/76         Passed - Last Heart Rate in normal range    Pulse Readings from Last 1 Encounters:  09/20/22 84         Passed - Valid encounter within last 12 months    Recent Outpatient Visits           2 weeks ago Infection   [facility] Malva Limes, MD   4 months ago Encounter for subsequent annual wellness visit (AWV) in Medicare patient   [facility] Merita Norton T, FNP   9 months ago Primary hypertension   [facility] [place], Darl Householder, DO   1 year ago [employer] preventive visit   [facility] Merita Norton T, FNP   2 years ago Essential hypertension   [facility] [place], Marzella Schlein, MD       Future Appointments             In 5 months Willeen Niece, MD [facility]             furosemide (LASIX) 40 MG tablet [Pharmacy Med Name: FUROSEMIDE 40 MG TABLET] 180 tablet 1    Sig: TAKE 1 TABLET BY MOUTH TWICE A DAY AS NEEDED     Cardiovascular:  Diuretics - Loop Failed - 10/06/2022  7:29 PM      Failed - Ca in normal range and within 180 days    Calcium  Date Value Ref Range Status  05/11/2022 8.6 (L) 8.7 - 10.3 mg/dL Final   Calcium, Total  Date Value Ref Range Status  11/28/2013 8.0 (L) 8.5 - 10.1 mg/dL Final         Failed - Mg Level in normal range and within 180 days    Magnesium  Date Value Ref Range Status  02/06/2019 1.9 1.7 - 2.4 mg/dL Final    Comment:    Performed at  [facility], [address] [place], [place] [address]         Passed - K in normal range and within 180 days    Potassium  Date Value Ref Range Status  05/11/2022 4.2 3.5 - 5.2 mmol/L Final  11/28/2013 3.9 3.5 - 5.1 mmol/L Final         Passed - Na in normal range and within 180 days    Sodium  Date Value Ref Range Status  05/11/2022 137 134 - 144 mmol/L Final  11/28/2013 137 136 - 145 mmol/L Final         Passed - Cr in normal range and within 180 days    Creatinine  Date Value Ref Range Status  11/28/2013 0.73 0.60 - 1.30 mg/dL Final   Creatinine, Ser  Date Value Ref Range Status  05/11/2022 0.79 0.57 - 1.00 mg/dL Final         Passed - Cl in normal range and within 180  days    Chloride  Date Value Ref Range Status  05/11/2022 100 96 - 106 mmol/L Final  11/28/2013 104 98 - 107 mmol/L Final         Passed - Last BP in normal range    BP Readings from Last 1 Encounters:  09/20/22 138/76         Passed - Valid encounter within last 6 months    Recent Outpatient Visits           2 weeks ago Infection   [facility] Malva Limes, MD   4 months ago Encounter for subsequent annual wellness visit (AWV) in Medicare patient   [facility] Jacky Kindle, FNP   9 months ago Primary hypertension   [facility] Caro Laroche, DO   1 year ago Welcome to [employer] preventive visit   [facility] Merita Norton T, FNP   2 years ago Essential hypertension   [facility], Marzella Schlein, MD       Future Appointments             In 5 months Willeen Niece, MD [facility]

## 2022-10-13 ENCOUNTER — Ambulatory Visit
Admission: RE | Admit: 2022-10-13 | Discharge: 2022-10-13 | Disposition: A | Discharge status: Home / Self Care | Payer: Medicare HMO | Source: Ambulatory Visit | Attending: Family Medicine | Admitting: Family Medicine

## 2022-10-13 DIAGNOSIS — Z1231 Encounter for screening mammogram for malignant neoplasm of breast: Secondary | ICD-10-CM | POA: Diagnosis not present

## 2023-03-15 ENCOUNTER — Encounter: Payer: Self-pay | Admitting: Dermatology

## 2023-03-15 ENCOUNTER — Ambulatory Visit: Payer: Medicare HMO | Admitting: Dermatology

## 2023-03-15 DIAGNOSIS — L821 Other seborrheic keratosis: Secondary | ICD-10-CM | POA: Diagnosis not present

## 2023-03-15 DIAGNOSIS — L820 Inflamed seborrheic keratosis: Secondary | ICD-10-CM | POA: Diagnosis not present

## 2023-03-15 DIAGNOSIS — L814 Other melanin hyperpigmentation: Secondary | ICD-10-CM

## 2023-03-15 NOTE — Progress Notes (Signed)
   Follow-Up Visit   Subjective  Leslie Koch is a 68 y.o. female who presents for the following: ISKs. Patient states she has more lesions to treat. Rubbed, irritated.   The patient has spots, moles and lesions to be evaluated, some may be new or changing and the patient may have concern these could be cancer.    The following portions of the chart were reviewed this encounter and updated as appropriate: medications, allergies, medical history  Review of Systems:  No other skin or systemic complaints except as noted in HPI or Assessment and Plan.  Objective  Well appearing patient in no apparent distress; mood and affect are within normal limits.  A focused examination was performed of the following areas: Torso, legs Relevant physical exam findings are noted in the Assessment and Plan.  R calf x1, L upper med thigh x1, R upper med thigh x2, L inferior buttock/hip x1, L temporal hair line x2, L lat breast x1, R lat breast x1, R upper back x1 (10) Erythematous keratotic or waxy stuck-on papule or plaque.  Assessment & Plan   SEBORRHEIC KERATOSIS - Stuck-on, waxy, tan-brown papules and/or plaques back, extremities - Benign-appearing - Discussed benign etiology and prognosis. - Observe - Call for any changes  LENTIGINES Exam: scattered tan macules back, extremities Due to sun exposure Treatment Plan: Benign-appearing, observe. Recommend daily broad spectrum sunscreen SPF 30+ to sun-exposed areas, reapply every 2 hours as needed.  Call for any changes   INFLAMED SEBORRHEIC KERATOSIS (10) R calf x1, L upper med thigh x1, R upper med thigh x2, L inferior buttock/hip x1, L temporal hair line x2, L lat breast x1, R lat breast x1, R upper back x1 (10) Symptomatic, irritating, patient would like treated. Destruction of lesion - R calf x1, L upper med thigh x1, R upper med thigh x2, L inferior buttock/hip x1, L temporal hair line x2, L lat breast x1, R lat breast x1, R upper back  x1 (10)  Destruction method: cryotherapy   Informed consent: discussed and consent obtained   Lesion destroyed using liquid nitrogen: Yes   Region frozen until ice ball extended beyond lesion: Yes   Outcome: patient tolerated procedure well with no complications   Post-procedure details: wound care instructions given   Additional details:  Prior to procedure, discussed risks of blister formation, small wound, skin dyspigmentation, or rare scar following cryotherapy. Recommend Vaseline ointment to treated areas while healing.    Return if symptoms worsen or fail to improve.  I, Lawson Radar, CMA, am acting as scribe for Willeen Niece, MD.   Documentation: I have reviewed the above documentation for accuracy and completeness, and I agree with the above.  Willeen Niece, MD

## 2023-03-15 NOTE — Patient Instructions (Addendum)
 Cryotherapy Aftercare  Wash gently with soap and water everyday.   Apply Vaseline Jelly and Band-Aid daily until healed.     Due to recent changes in healthcare laws, you may see results of your pathology and/or laboratory studies on MyChart before the doctors have had a chance to review them. We understand that in some cases there may be results that are confusing or concerning to you. Please understand that not all results are received at the same time and often the doctors may need to interpret multiple results in order to provide you with the best plan of care or course of treatment. Therefore, we ask that you please give [place] 2 business days to thoroughly review all your results before contacting the office for clarification. Should we see a critical lab result, you will be contacted sooner.   If You Need Anything After Your Visit  If you have any questions or concerns for your doctor, please call our main line at [phone] and press option 4 to reach your doctor's medical assistant. If no one answers, please leave a voicemail as directed and we will return your call as soon as possible. Messages left after 4 pm will be answered the following business day.   You may also send [place] a message via MyChart. We typically respond to MyChart messages within 1-2 business days.  For prescription refills, please ask your pharmacy to contact our office. Our fax number is [phone].  If you have an urgent issue when the clinic is closed that cannot wait until the next business day, you can page your doctor at the number below.    Please note that while we do our best to be available for urgent issues outside of office hours, we are not available 24/7.   If you have an urgent issue and are unable to reach [place], you may choose to seek medical care at your doctor's office, retail clinic, urgent care center, or emergency room.  If you have a medical emergency, please immediately call 911 or go to the  emergency department.  Pager Numbers  - Dr. Gwen Pounds: [phone]  - Dr. Roseanne Reno: [phone]  - Dr. Katrinka Blazing: [phone]   In the event of inclement weather, please call our main line at [phone] for an update on the status of any delays or closures.  Dermatology Medication Tips: Please keep the boxes that topical medications come in in order to help keep track of the instructions about where and how to use these. Pharmacies typically print the medication instructions only on the boxes and not directly on the medication tubes.   If your medication is too expensive, please contact our office at [phone] option 4 or send [place] a message through MyChart.   We are unable to tell what your co-pay for medications will be in advance as this is different depending on your insurance coverage. However, we may be able to find a substitute medication at lower cost or fill out paperwork to get insurance to cover a needed medication.   If a prior authorization is required to get your medication covered by your insurance company, please allow [place] 1-2 business days to complete this process.  Drug prices often vary depending on where the prescription is filled and some pharmacies may offer cheaper prices.  The website www.goodrx.com contains coupons for medications through different pharmacies. The prices here do not account for what the cost may be with help from insurance (it may be cheaper with your insurance), but the  website can give you the price if you did not use any insurance.  - You can print the associated coupon and take it with your prescription to the pharmacy.  - You may also stop by our office during regular business hours and pick up a GoodRx coupon card.  - If you need your prescription sent electronically to a different pharmacy, notify our office through [facility] or by phone at [phone] option 4.     Si Usted Necesita Algo Despus de Su Visita  Tambin puede  enviarnos un mensaje a travs de Clinical cytogeneticist. Por lo general respondemos a los mensajes de MyChart en el transcurso de 1 a 2 das hbiles.  Para renovar recetas, por favor pida a su farmacia que se ponga en contacto con nuestra oficina. Annie Sable de fax es [place] [phone].  Si tiene un asunto urgente cuando la clnica est cerrada y que no puede esperar hasta el siguiente da hbil, puede llamar/localizar a su doctor(a) al nmero que aparece a continuacin.   Por favor, tenga en cuenta que aunque hacemos todo lo posible para estar disponibles para asuntos urgentes fuera del horario de [place], no estamos disponibles las 24 horas del da, los 7 [address]  [address] de la [place].   Si tiene un problema urgente y no puede comunicarse con nosotros, puede optar por buscar atencin mdica  en el consultorio de su doctor(a), en una clnica privada, en un centro de atencin urgente o en una sala de emergencias.  Si tiene Engineer, drilling, por favor llame inmediatamente al 911 o vaya a la sala de emergencias.  Nmeros de bper  - Dr. Gwen Pounds: [phone]  - Dra. Roseanne Reno: [phone]  - Dr. Katrinka Blazing: [phone]   En caso de inclemencias del tiempo, por favor llame a Lacy Duverney principal al [phone] para una actualizacin sobre el [place] de cualquier retraso o cierre.  Consejos para la medicacin en dermatologa: Por favor, guarde las cajas en las que vienen los medicamentos de uso tpico para ayudarle a seguir las instrucciones sobre dnde y cmo usarlos. Las farmacias generalmente imprimen las instrucciones del medicamento slo en las cajas y no directamente en los tubos del [place].   Si su medicamento es muy caro, por favor, pngase en contacto con Rolm Gala llamando al [phone] y presione la opcin 4 o envenos un mensaje a travs de Clinical cytogeneticist.   No podemos decirle cul ser su copago por los medicamentos por adelantado ya que esto es diferente dependiendo de la cobertura de su  seguro. Sin embargo, es posible que podamos encontrar un medicamento sustituto a Audiological scientist un formulario para que el seguro cubra el medicamento que se considera necesario.   Si se requiere una autorizacin previa para que su compaa de seguros [place] su medicamento, por favor permtanos de 1 a 2 das hbiles para completar [address]  Los precios de los medicamentos varan con frecuencia dependiendo del Environmental consultant de dnde se surte la receta y alguna farmacias pueden ofrecer precios ms baratos.  El sitio web www.goodrx.com tiene cupones para medicamentos de Health and safety inspector. Los precios aqu no tienen en cuenta lo que podra costar con la ayuda del seguro (puede ser ms barato con su seguro), pero el sitio web puede darle el precio si no utiliz Tourist information centre manager.  - Puede imprimir el cupn correspondiente y llevarlo con su receta a la farmacia.  - Tambin puede pasar por nuestra oficina durante el horario de atencin regular y Education officer, museum una tarjeta de cupones de  GoodRx.  - Si necesita que su receta se enve electrnicamente a una farmacia diferente, informe a nuestra oficina a travs de MyChart de [place] o por telfono llamando al [phone] y presione la opcin 4.

## 2023-03-16 ENCOUNTER — Telehealth: Payer: Self-pay | Admitting: Family Medicine

## 2023-03-16 DIAGNOSIS — R600 Localized edema: Secondary | ICD-10-CM

## 2023-03-16 DIAGNOSIS — B001 Herpesviral vesicular dermatitis: Secondary | ICD-10-CM

## 2023-03-16 NOTE — Telephone Encounter (Signed)
 [facility] faxed refill request for the following medications:   valACYclovir (VALTREX) 1000 MG tablet     furosemide (LASIX) 40 MG tablet     Please advise.

## 2023-03-17 MED ORDER — VALACYCLOVIR HCL 1 G PO TABS: 1000.0000 mg | ORAL_TABLET | Freq: Two times a day (BID) | ORAL | 0 refills | Status: AC | PRN

## 2023-03-17 MED ORDER — FUROSEMIDE 40 MG PO TABS
40.0000 mg | ORAL_TABLET | Freq: Two times a day (BID) | ORAL | 1 refills | Status: DC | PRN
Start: 2023-03-17 — End: 2023-10-17

## 2023-04-12 DIAGNOSIS — J309 Allergic rhinitis, unspecified: Secondary | ICD-10-CM | POA: Diagnosis not present

## 2023-04-12 DIAGNOSIS — R0989 Other specified symptoms and signs involving the circulatory and respiratory systems: Secondary | ICD-10-CM | POA: Diagnosis not present

## 2023-04-12 DIAGNOSIS — K219 Gastro-esophageal reflux disease without esophagitis: Secondary | ICD-10-CM | POA: Diagnosis not present

## 2023-06-03 IMAGING — MG MM DIGITAL SCREENING BILAT W/ TOMO AND CAD
8 series · 8 of 24 positions shown · non-contrast
Comparison: Previous exam(s).

CLINICAL DATA: Screening.

EXAM:
DIGITAL SCREENING BILATERAL MAMMOGRAM WITH TOMOSYNTHESIS AND CAD
TECHNIQUE: Bilateral screening digital craniocaudal and mediolateral oblique
mammograms were obtained. Bilateral screening digital breast
tomosynthesis was performed. The images were evaluated with
computer-aided detection.

[R MLO synth-2D]
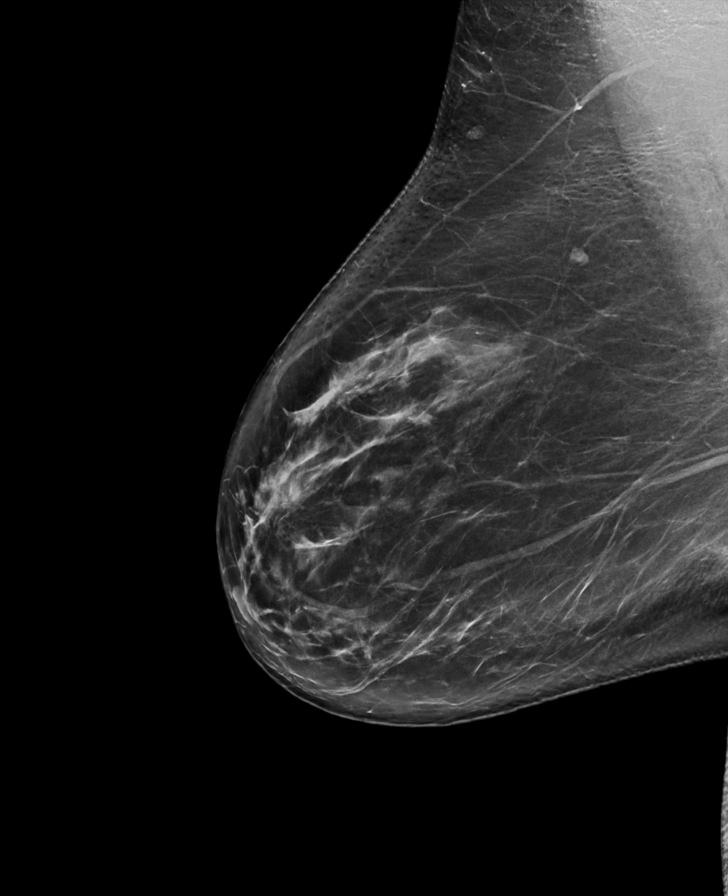

[R CC synth-2D]
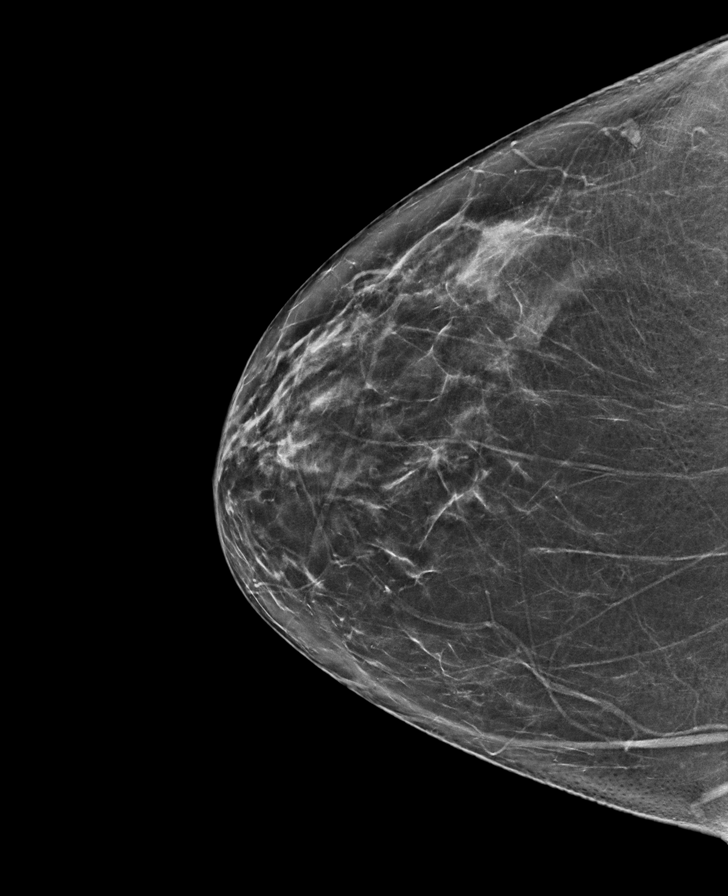

[L CC synth-2D]
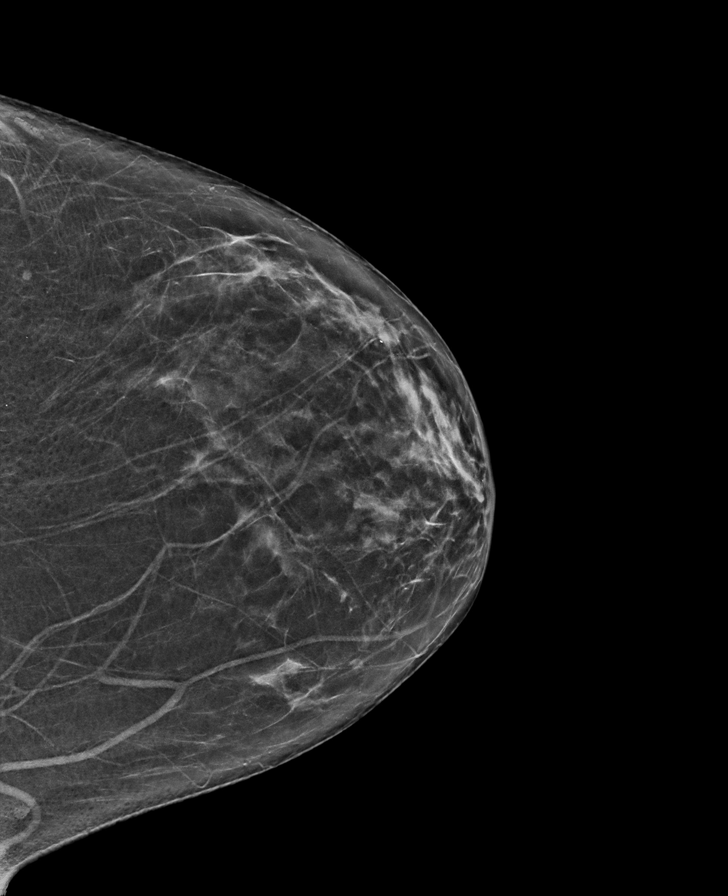

[L MLO synth-2D]
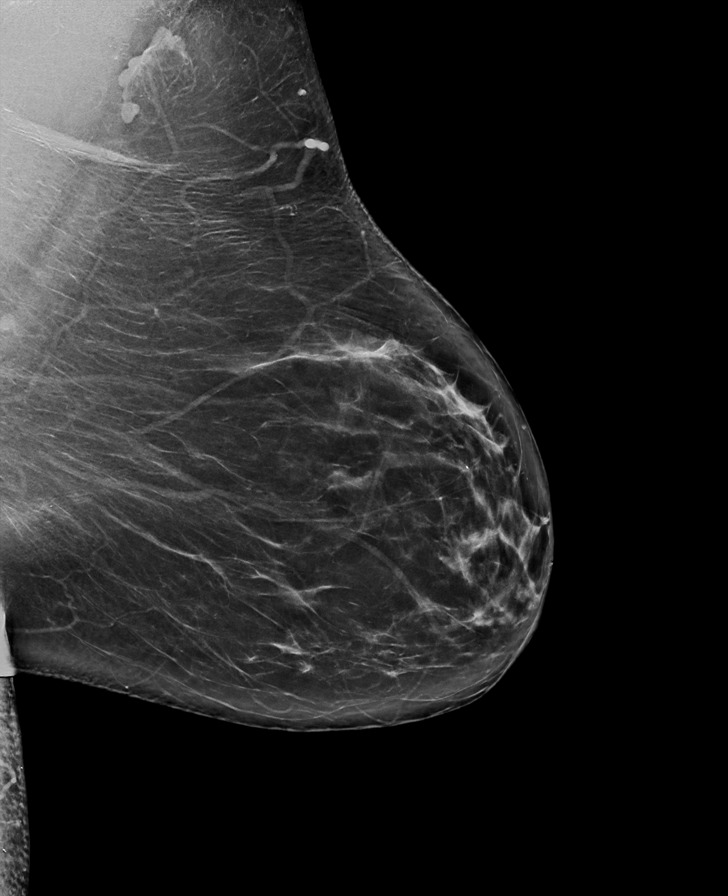

[R CC tomo · tomo slice 37/74.0]
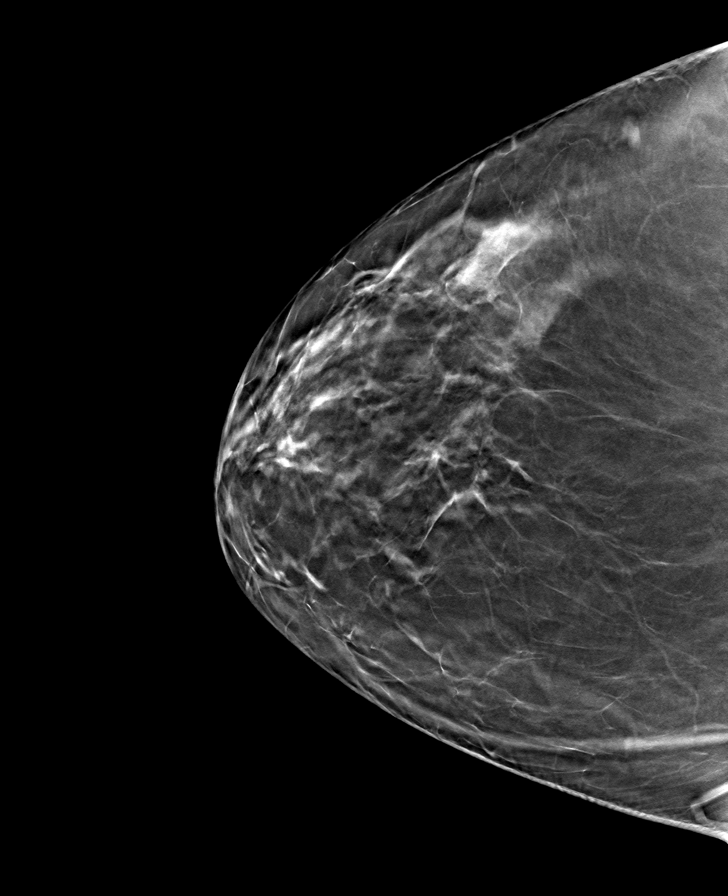

[R MLO tomo · tomo slice 43/86.0]
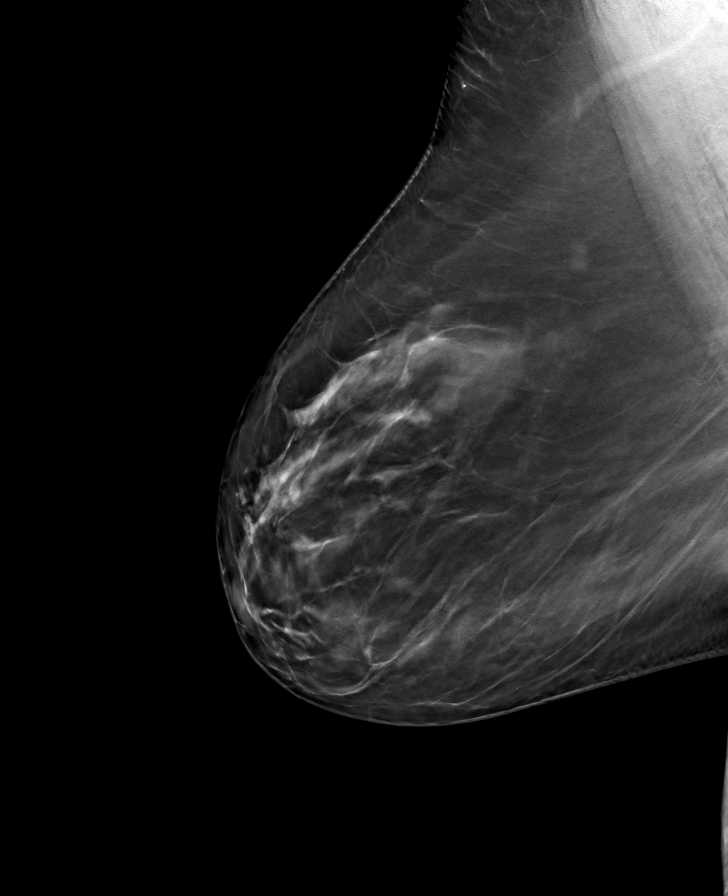

[L CC tomo · tomo slice 35/68.0]
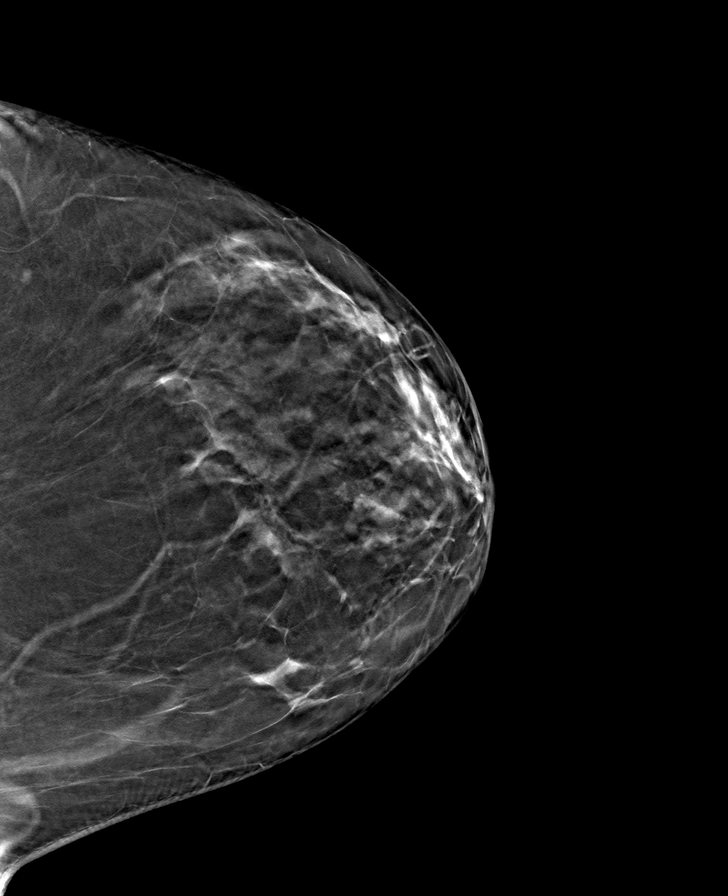

[L MLO tomo · tomo slice 42/83.0]
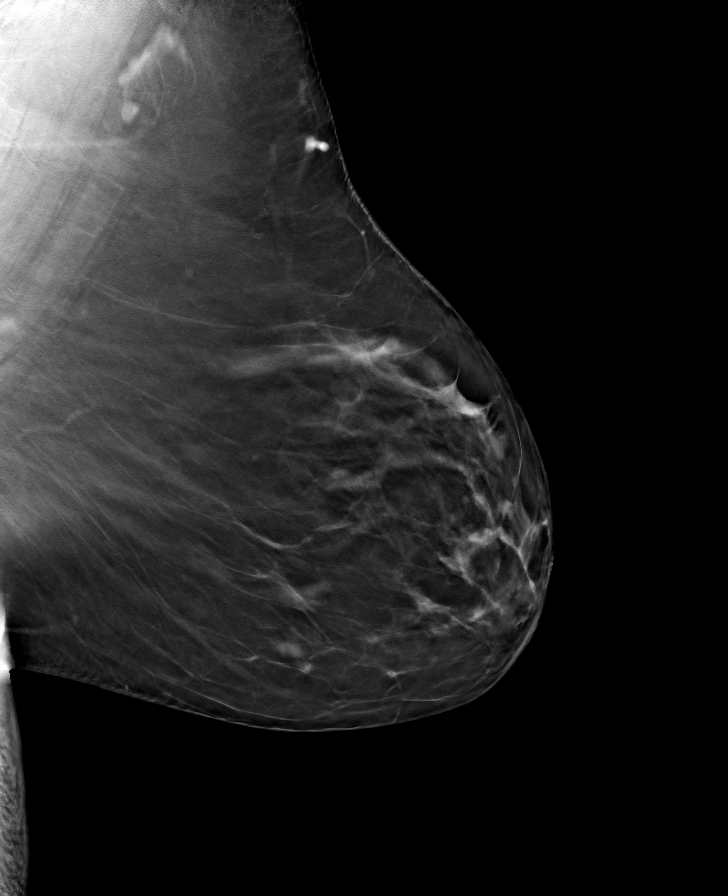

[8 of 24 positions shown; findings below may reference images not displayed]

ACR Breast Density Category b: There are scattered areas of
fibroglandular density.
FINDINGS: There are no findings suspicious for malignancy.
IMPRESSION: No mammographic evidence of malignancy. A result letter of this
screening mammogram will be mailed directly to the patient.

RECOMMENDATION:
Screening mammogram in one year. (Code:51-O-LD2)

BI-RADS CATEGORY  1: Negative.

## 2023-07-14 ENCOUNTER — Other Ambulatory Visit: Payer: Self-pay

## 2023-07-14 ENCOUNTER — Telehealth: Payer: Self-pay | Admitting: Family Medicine

## 2023-07-14 DIAGNOSIS — I10 Essential (primary) hypertension: Secondary | ICD-10-CM

## 2023-07-14 DIAGNOSIS — E7800 Pure hypercholesterolemia, unspecified: Secondary | ICD-10-CM

## 2023-07-14 MED ORDER — SIMVASTATIN 40 MG PO TABS: ORAL_TABLET | ORAL | 0 refills | Status: DC

## 2023-07-14 MED ORDER — LISINOPRIL-HYDROCHLOROTHIAZIDE 20-12.5 MG PO TABS: 1.0000 | ORAL_TABLET | Freq: Every day | ORAL | 0 refills | Status: DC

## 2023-07-14 NOTE — Telephone Encounter (Signed)
 Converted into a refill request

## 2023-07-14 NOTE — Telephone Encounter (Signed)
 [facility] faxed refill request for the following medications:   lisinopril -hydrochlorothiazide  (ZESTORETIC ) 20-12.5 MG tablet    As well as the request below  Please advise.

## 2023-07-14 NOTE — Telephone Encounter (Signed)
 [facility] ref[facility] request for the following medications:   simvastatin  (ZOCOR ) 40 MG tablet    Please advise

## 2023-07-25 ENCOUNTER — Other Ambulatory Visit: Payer: Self-pay

## 2023-07-25 ENCOUNTER — Telehealth: Payer: Self-pay | Admitting: Family Medicine

## 2023-07-25 DIAGNOSIS — N898 Other specified noninflammatory disorders of vagina: Secondary | ICD-10-CM

## 2023-07-25 DIAGNOSIS — M2550 Pain in unspecified joint: Secondary | ICD-10-CM

## 2023-07-25 NOTE — Telephone Encounter (Signed)
Converted to refill request 

## 2023-07-25 NOTE — Telephone Encounter (Signed)
 [facility] faxed refill request for the following medications:   estradiol  (ESTRACE ) 1 MG tablet   celecoxib  (CELEBREX ) 200 MG capsule    Please advise.

## 2023-07-25 NOTE — Telephone Encounter (Signed)
 LOV 09/20/22 NOV none LRF Celebrex  05/11/22 90 x 4 LRF 05/12/22 42.5 x 12

## 2023-07-26 NOTE — Telephone Encounter (Signed)
 Called pt and informed of refusal. Patient verbalized understanding, stating she is switching PCP due to having so many changes of provider since she came here.

## 2023-07-27 ENCOUNTER — Telehealth: Payer: Self-pay | Admitting: Family Medicine

## 2023-07-27 NOTE — Telephone Encounter (Signed)
 [facility] [place] [facility] on Celecoxib  200 mg.  #90 with refills

## 2023-07-27 NOTE — Telephone Encounter (Signed)
 [facility] [place] [facility] asking for refills of Estradiol  1 mg. Tabs #180

## 2023-07-29 NOTE — Telephone Encounter (Signed)
 Needs appointment

## 2023-08-01 NOTE — Telephone Encounter (Signed)
 Patient stated he meds were on auto-refill but she was not trying to get a refill as she was changing practices since her provider has left.

## 2023-08-01 NOTE — Telephone Encounter (Signed)
 LMTCB if she does let her know that she will need an appointment in order receive refills.

## 2023-08-23 ENCOUNTER — Encounter: Payer: Self-pay | Admitting: Internal Medicine

## 2023-08-23 ENCOUNTER — Other Ambulatory Visit: Payer: Self-pay | Admitting: Family Medicine

## 2023-08-23 ENCOUNTER — Ambulatory Visit: Admitting: Internal Medicine

## 2023-08-23 VITALS — BP 110/66 | HR 96 | Temp 97.7°F | Ht 61.0 in | Wt 221.4 lb

## 2023-08-23 DIAGNOSIS — N898 Other specified noninflammatory disorders of vagina: Secondary | ICD-10-CM

## 2023-08-23 DIAGNOSIS — M79643 Pain in unspecified hand: Secondary | ICD-10-CM

## 2023-08-23 DIAGNOSIS — G8929 Other chronic pain: Secondary | ICD-10-CM

## 2023-08-23 DIAGNOSIS — M25562 Pain in left knee: Secondary | ICD-10-CM

## 2023-08-23 DIAGNOSIS — Z79818 Long term (current) use of other agents affecting estrogen receptors and estrogen levels: Secondary | ICD-10-CM | POA: Diagnosis not present

## 2023-08-23 DIAGNOSIS — Z1211 Encounter for screening for malignant neoplasm of colon: Secondary | ICD-10-CM

## 2023-08-23 DIAGNOSIS — M2550 Pain in unspecified joint: Secondary | ICD-10-CM

## 2023-08-23 DIAGNOSIS — R232 Flushing: Secondary | ICD-10-CM

## 2023-08-23 DIAGNOSIS — E7800 Pure hypercholesterolemia, unspecified: Secondary | ICD-10-CM | POA: Diagnosis not present

## 2023-08-23 DIAGNOSIS — I10 Essential (primary) hypertension: Secondary | ICD-10-CM | POA: Diagnosis not present

## 2023-08-23 DIAGNOSIS — R600 Localized edema: Secondary | ICD-10-CM

## 2023-08-23 DIAGNOSIS — Z1231 Encounter for screening mammogram for malignant neoplasm of breast: Secondary | ICD-10-CM

## 2023-08-23 LAB — HEPATIC FUNCTION PANEL
ALT: 9 U/L (ref 0–35)
AST: 12 U/L (ref 0–37)
Albumin: 3.8 g/dL (ref 3.5–5.2)
Alkaline Phosphatase: 43 U/L (ref 39–117)
Bilirubin, Direct: 0.1 mg/dL (ref 0.0–0.3)
Total Bilirubin: 0.4 mg/dL (ref 0.2–1.2)
Total Protein: 6.6 g/dL (ref 6.0–8.3)

## 2023-08-23 LAB — BASIC METABOLIC PANEL WITH GFR
BUN: 14 mg/dL (ref 6–23)
CO2: 32 meq/L (ref 19–32)
Calcium: 8.8 mg/dL (ref 8.4–10.5)
Chloride: 99 meq/L (ref 96–112)
Creatinine, Ser: 0.7 mg/dL (ref 0.40–1.20)
GFR: 89.55 mL/min (ref >60.00)
Glucose, Bld: 93 mg/dL (ref 70–99)
Potassium: 4 meq/L (ref 3.5–5.1)
Sodium: 140 meq/L (ref 135–145)

## 2023-08-23 LAB — LIPID PANEL
Cholesterol: 181 mg/dL (ref 0–200)
HDL: 63.2 mg/dL (ref >39.00)
LDL Cholesterol: 85 mg/dL (ref 0–99)
NonHDL: 118.28
Total CHOL/HDL Ratio: 3
Triglycerides: 168 mg/dL — ABNORMAL HIGH (ref 0.0–149.0)
VLDL: 33.6 mg/dL (ref 0.0–40.0)

## 2023-08-23 LAB — CBC WITH DIFFERENTIAL/PLATELET
Basophils Absolute: 0.1 K/uL (ref 0.0–0.1)
Basophils Relative: 0.9 % (ref 0.0–3.0)
Eosinophils Absolute: 0.1 K/uL (ref 0.0–0.7)
Eosinophils Relative: 0.9 % (ref 0.0–5.0)
HCT: 40.6 % (ref 36.0–46.0)
Hemoglobin: 13.5 g/dL (ref 12.0–15.0)
Lymphocytes Relative: 28.7 % (ref 12.0–46.0)
Lymphs Abs: 2.2 K/uL (ref 0.7–4.0)
MCHC: 33.2 g/dL (ref 30.0–36.0)
MCV: 90.7 fl (ref 78.0–100.0)
Monocytes Absolute: 0.4 K/uL (ref 0.1–1.0)
Monocytes Relative: 5.9 % (ref 3.0–12.0)
Neutro Abs: 4.9 K/uL (ref 1.4–7.7)
Neutrophils Relative %: 63.6 % (ref 43.0–77.0)
Platelets: 293 K/uL (ref 150.0–400.0)
RBC: 4.48 Mil/uL (ref 3.87–5.11)
RDW: 14.1 % (ref 11.5–15.5)
WBC: 7.7 K/uL (ref 4.0–10.5)

## 2023-08-23 LAB — TSH: TSH: 2.6 u[IU]/mL (ref 0.35–5.50)

## 2023-08-23 MED ORDER — GABAPENTIN 100 MG PO CAPS: 100.0000 mg | ORAL_CAPSULE | Freq: Every day | ORAL | 2 refills | Status: DC

## 2023-08-23 MED ORDER — CELECOXIB 200 MG PO CAPS: 200.0000 mg | ORAL_CAPSULE | Freq: Every day | ORAL | 1 refills | Status: DC

## 2023-08-23 MED ORDER — ESTRADIOL 1 MG PO TABS: 2.0000 mg | ORAL_TABLET | Freq: Every day | ORAL | 1 refills | Status: DC

## 2023-08-23 NOTE — Progress Notes (Signed)
 Subjective:    Patient ID: Leslie Koch, female    DOB: 1955/07/29, 68 y.o.   MRN: [id number]  Patient here for  Chief Complaint  Patient presents with   New Patient (Initial Visit)    HPI Here to establish care. In reviewing, she has a history of hypertension, GERD, arthritis and documented asthma. Having knee pain left > right. S/p gel injections. Brace helps. Planning to f/u with Dr Lonn. Also reports pain - base of her thumb and some numbness. Request referral. Has seen Dr Unk. Previously colonoscopy 06/2021 - 2 3-56mm polyps and diverticulosis. Recommended f/u colonoscopy in 7 years. Up to date with mammograms. Increased arthritis pain. Takes celebrex  regularly. Also on estrogen. Feels better on estrogen. No chest pain reported. Breathing stable. No abdominal pain reported.    Past Medical History:  Diagnosis Date   Allergy ?   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 Knee Replacement   KNEE SURGERY Right 07/12/2010   TOTAL KNEE ARTHROPLASTY Right 10/12/2012   TOTAL KNEE ARTHROPLASTY WITH REVISION COMPONENTS Right 02/11/2016   Procedure: RIGHT TOTAL KNEE ARTHROPLASTY REVISION;  Surgeon: Dempsey Moan, MD;  Location: [facility];  Service: Orthopedics;  Laterality: Right;   TUBAL LIGATION  01/11/1982   Family History  Problem Relation Age of Onset   Hypertension Mother    Diabetes Mother    Coronary artery disease Mother    Arthritis Mother    Stroke Mother    Vision loss Mother    Lung cancer  Father    Cancer Father    Stroke Maternal Grandmother    Coronary artery disease Maternal Grandfather    Breast cancer Paternal Grandmother    Cancer Paternal Grandmother    Hearing loss Son    Social History   Socioeconomic History   Marital status: Married    Spouse name: Not on file   Number of children: Not on file   Years of education: Not on file   Highest education level: Some college, no degree  Occupational History   Not on file  Tobacco Use   Smoking status: Former    Current packs/day: 0.00    Average packs/day: 2.0 packs/day for 30.0 years (60.0 ttl pk-yrs)    Types: Cigarettes    Start date: 04/13/1965    Quit date: 04/14/1995    Years since quitting: 28.3   Smokeless tobacco: Never   Tobacco comments:    quit 04/14/1995  Vaping Use   Vaping status: Never Used  Substance and Sexual Activity   Alcohol use: Not Currently    Comment: occasionally 2-3 drinks once or twice a month   Drug use: No   Sexual activity: Not Currently    Birth control/protection: Post-menopausal  Other Topics Concern   Not on file  Social History Narrative   Not on file   Social Drivers of Health   Financial Resource Strain: Low Risk  (08/16/2023)   Overall Financial Resource Strain (CARDIA)    Difficulty  of Paying Living Expenses: Not very hard  Food Insecurity: No Food Insecurity (08/16/2023)   Hunger Vital Sign    Worried About Running Out of Food in the Last Year: Never true    Ran Out of Food in the Last Year: Never true  Transportation Needs: No Transportation Needs (08/16/2023)   PRAPARE - Administrator, Civil Service (Medical): No    Lack of Transportation (Non-Medical): No  Physical Activity: Insufficiently Active (08/16/2023)   Exercise Vital Sign    Days of Exercise per Week: 1 day    Minutes of Exercise per Session: 60 min  Stress: Stress Concern Present (08/16/2023)   [employer] of Occupational Health - Occupational Stress Questionnaire    Feeling of  Stress: Very much  Social Connections: Socially Integrated (08/16/2023)   Social Connection and Isolation Panel    Frequency of Communication with Friends and Family: More than three times a week    Frequency of Social Gatherings with Friends and Family: Once a week    Attends Religious Services: More than 4 times per year    Active Member of [employer] or Organizations: Yes    Attends Engineer, structural: More than 4 times per year    Marital Status: Married     Review of Systems  Constitutional:  Negative for appetite change and unexpected weight change.  HENT:  Negative for congestion and sinus pressure.   Respiratory:  Negative for cough, chest tightness and shortness of breath.   Cardiovascular:  Negative for chest pain, palpitations and leg swelling.  Gastrointestinal:  Negative for abdominal pain, diarrhea, nausea and vomiting.  Genitourinary:  Negative for difficulty urinating and dysuria.  Musculoskeletal:        Joint pains - arthritis pain. Specifically knee pain.   Skin:  Negative for color change and rash.  Neurological:  Negative for dizziness and headaches.  Psychiatric/Behavioral:  Negative for agitation and dysphoric mood.        Some increased stress with her mother's health issues.        Objective:     BP 110/66 (BP Location: Left Arm, Patient Position: Sitting, Cuff Size: Normal)   Pulse 96   Temp 97.7 F (36.5 C) (Oral)   Ht 5' 1 (1.549 m)   Wt 221 lb 6.4 oz (100.4 kg)   SpO2 97%   BMI 41.83 kg/m  Wt Readings from Last 3 Encounters:  08/23/23 221 lb 6.4 oz (100.4 kg)  09/20/22 220 lb (99.8 kg)  05/24/22 229 lb (103.9 kg)    Physical Exam Vitals reviewed.  Constitutional:      General: She is not in acute distress.    Appearance: Normal appearance.  HENT:     Head: Normocephalic and atraumatic.     Right Ear: External ear normal.     Left Ear: External ear normal.     Mouth/Throat:     Pharynx: No oropharyngeal exudate or posterior  oropharyngeal erythema.  Eyes:     General: No scleral icterus.       Right eye: No discharge.        Left eye: No discharge.     Conjunctiva/sclera: Conjunctivae normal.  Neck:     Thyroid: No thyromegaly.  Cardiovascular:     Rate and Rhythm: Normal rate and regular rhythm.  Pulmonary:     Effort: No respiratory distress.     Breath sounds: Normal breath sounds. No wheezing.  Abdominal:     General: Bowel sounds  are normal.     Palpations: Abdomen is soft.     Tenderness: There is no abdominal tenderness.  Musculoskeletal:        General: No swelling or tenderness.     Cervical back: Neck supple. No tenderness.  Lymphadenopathy:     Cervical: No cervical adenopathy.  Skin:    Findings: No erythema or rash.  Neurological:     Mental Status: She is alert.  Psychiatric:        Mood and Affect: Mood normal.        Behavior: Behavior normal.         Outpatient Encounter Medications as of 08/23/2023  Medication Sig   albuterol  (VENTOLIN  HFA) 108 (90 Base) MCG/ACT inhaler TAKE 2 PUFFS BY MOUTH EVERY 6 HOURS AS NEEDED FOR WHEEZE OR SHORTNESS OF BREATH   aspirin  EC 81 MG tablet Take 81 mg by mouth daily.    Calcium Carbonate-Vitamin D  (CALCIUM 500/D PO) Take 1 tablet by mouth daily.   cetirizine (ZYRTEC) 10 MG tablet Take 10 mg by mouth daily.    Cholecalciferol  (VITAMIN D3) 1000 units CAPS Take 1,000 Units by mouth in the morning and at bedtime.   dextromethorphan -guaiFENesin  (MUCINEX  DM) 30-600 MG 12hr tablet Take 1 tablet by mouth 4 (four) times daily. Take 2 in the morning and 2 nightly   estradiol  (ESTRACE ) 0.1 MG/GM vaginal cream Place 1 Applicatorful vaginally 3 (three) times a week.   fluticasone (FLONASE) 50 MCG/ACT nasal spray Place 2 sprays into both nostrils daily.   furosemide  (LASIX ) 40 MG tablet Take 1 tablet (40 mg total) by mouth 2 (two) times daily as needed.   gabapentin  (NEURONTIN ) 100 MG capsule Take 1 capsule (100 mg total) by mouth at bedtime.    Glucosamine-Chondroitin 750-600 MG TABS Take 1 tablet by mouth in the morning and at bedtime.   lisinopril -hydrochlorothiazide  (ZESTORETIC ) 20-12.5 MG tablet Take 1 tablet by mouth daily.   Omega-3 1000 MG CAPS Take 2,000 mg by mouth daily.   oxymetazoline (AFRIN) 0.05 % nasal spray Place 1-2 sprays into both nostrils 2 (two) times daily as needed for congestion.   potassium chloride  (K-DUR) 10 MEQ tablet Take 1 tablet (10 mEq total) by mouth 2 (two) times daily as needed.   simvastatin  (ZOCOR ) 40 MG tablet TAKE 1 TABLET BY MOUTH EVERYDAY AT BEDTIME   valACYclovir  (VALTREX ) 1000 MG tablet Take 1-2 tablets (1,000-2,000 mg total) by mouth every 12 (twelve) hours as needed.   [DISCONTINUED] celecoxib  (CELEBREX ) 200 MG capsule Take 1 capsule (200 mg total) by mouth daily.   [DISCONTINUED] estradiol  (ESTRACE ) 1 MG tablet Take 2 tablets (2 mg total) by mouth daily.   amoxicillin -clavulanate (AUGMENTIN ) 875-125 MG tablet Take 1 tablet by mouth 2 (two) times daily. (Patient not taking: Reported on 08/23/2023)   celecoxib  (CELEBREX ) 200 MG capsule Take 1 capsule (200 mg total) by mouth daily.   estradiol  (ESTRACE ) 1 MG tablet Take 2 tablets (2 mg total) by mouth daily.   [DISCONTINUED] ondansetron  (ZOFRAN -ODT) 4 MG disintegrating tablet Take 1 tablet (4 mg total) by mouth every 8 (eight) hours as needed. (Patient not taking: Reported on 09/20/2022)   [DISCONTINUED] promethazine -dextromethorphan  (PROMETHAZINE -DM) 6.25-15 MG/5ML syrup Take 5 mLs by mouth 4 (four) times daily as needed. (Patient not taking: Reported on 09/20/2022)   [DISCONTINUED] senna-docusate (SENOKOT-S) 8.6-50 MG tablet Take 1 tablet by mouth at bedtime. (Patient not taking: Reported on 09/20/2022)   No facility-administered encounter medications on file as of 08/23/2023.  Lab Results  Component Value Date   WBC 7.7 08/23/2023   HGB 13.5 08/23/2023   HCT 40.6 08/23/2023   PLT 293.0 08/23/2023   GLUCOSE 93 08/23/2023   CHOL 181  08/23/2023   TRIG 168.0 (H) 08/23/2023   HDL 63.20 08/23/2023   LDLCALC 85 08/23/2023   ALT 9 08/23/2023   AST 12 08/23/2023   NA 140 08/23/2023   K 4.0 08/23/2023   CL 99 08/23/2023   CREATININE 0.70 08/23/2023   BUN 14 08/23/2023   CO2 32 08/23/2023   TSH 2.60 08/23/2023   INR 0.99 02/04/2016   HGBA1C 5.6 05/11/2022    MM 3D SCREENING MAMMOGRAM BILATERAL BREAST Result Date: 10/15/2022 CLINICAL DATA:  Screening. EXAM: DIGITAL SCREENING BILATERAL MAMMOGRAM WITH TOMOSYNTHESIS AND CAD TECHNIQUE: Bilateral screening digital craniocaudal and mediolateral oblique mammograms were obtained. Bilateral screening digital breast tomosynthesis was performed. The images were evaluated with computer-aided detection. COMPARISON:  Previous exam(s). ACR Breast Density Category b: There are scattered areas of fibroglandular density. FINDINGS: There are no findings suspicious for malignancy. IMPRESSION: No mammographic evidence of malignancy. A result letter of this screening mammogram will be mailed directly to the patient. RECOMMENDATION: Screening mammogram in one year. (Code:SM-B-01Y) BI-RADS CATEGORY  1: Negative. Electronically Signed   By: Toribio Agreste M.D.   On: 10/15/2022 09:09       Assessment & Plan:  Primary hypertension Assessment & Plan: Continues on lisinopril /hydrochlorothiazide  and lasix . Has been taking both. Blood pressure as outlined. Follow pressures. Check metabolic panel.   Orders: -     CBC with Differential/Platelet  Arthralgia, unspecified joint  Vaginal dryness  Hypercholesterolemia Assessment & Plan: Continues on simvastatin . Low cholesterol diet and exercise. Follow lipid panel. Check today.   Orders: -     Basic metabolic panel with GFR -     Lipid panel -     TSH -     Hepatic function panel  Screening mammogram for breast cancer Assessment & Plan: Mammogram 10/2022 - Birads I.    Screening for colon cancer Assessment & Plan: Colonoscopy 6/2-23 - 2  3-52mm polyps, diverticulosis, non bleeding external hemorrhoids. Recommended f/u in 7 years.    Chronic pain of left knee Assessment & Plan: Knee pain - left > right. On celebrex . Discussed continued celebrex . Has joint pains. Discussed - minimizing dose of celebrex . Having some issues with sleep and hot flashes. Trial of gabapentin . See if can control some of her pain, maybe can decrease amount of celebrex  taking. Check metabolic panel. F/u with Dr Lonn.    Pain of hand, unspecified laterality Assessment & Plan: Pain - base of thumb. Some numbness. Persistent. Thumb spica. Refer to Dr Francisco for evaluation.   Orders: -     Ambulatory referral to Orthopedic Surgery  Hot flashes Assessment & Plan: Trial of gabapentin  as outlined. Having difficulty sleeping. Titrate up. Hopefully can control pain as well. Follow.    Current use of estrogen therapy Assessment & Plan: Discussed continued estrogen. Discussed risk/side effects. Would like to taper. Continue current dose for now. Trial of gabapentin . Follow.    Other orders -     Celecoxib ; Take 1 capsule (200 mg total) by mouth daily.  Dispense: 90 capsule; Refill: 1 -     Estradiol ; Take 2 tablets (2 mg total) by mouth daily.  Dispense: 180 tablet; Refill: 1 -     Gabapentin ; Take 1 capsule (100 mg total) by mouth at bedtime.  Dispense: 30 capsule; Refill: 2  Allena Hamilton, MD

## 2023-08-25 ENCOUNTER — Ambulatory Visit: Payer: Self-pay | Admitting: Internal Medicine

## 2023-08-28 ENCOUNTER — Encounter: Payer: Self-pay | Admitting: Internal Medicine

## 2023-08-28 DIAGNOSIS — M79643 Pain in unspecified hand: Secondary | ICD-10-CM | POA: Insufficient documentation

## 2023-08-28 DIAGNOSIS — Z79818 Long term (current) use of other agents affecting estrogen receptors and estrogen levels: Secondary | ICD-10-CM | POA: Insufficient documentation

## 2023-08-28 DIAGNOSIS — R232 Flushing: Secondary | ICD-10-CM | POA: Insufficient documentation

## 2023-08-28 NOTE — Assessment & Plan Note (Signed)
 Discussed continued estrogen. Discussed risk/side effects. Would like to taper. Continue current dose for now. Trial of gabapentin . Follow.

## 2023-08-28 NOTE — Assessment & Plan Note (Signed)
 Continues on simvastatin . Low cholesterol diet and exercise. Follow lipid panel. Check today.

## 2023-08-28 NOTE — Assessment & Plan Note (Signed)
 Mammogram 10/2022 - Birads I.

## 2023-08-28 NOTE — Assessment & Plan Note (Signed)
 Colonoscopy 6/2-23 - 2 3-77mm polyps, diverticulosis, non bleeding external hemorrhoids. Recommended f/u in 7 years.

## 2023-08-28 NOTE — Assessment & Plan Note (Signed)
 Pain - base of thumb. Some numbness. Persistent. Thumb spica. Refer to Dr Francisco for evaluation.

## 2023-08-28 NOTE — Assessment & Plan Note (Signed)
 Knee pain - left > right. On celebrex . Discussed continued celebrex . Has joint pains. Discussed - minimizing dose of celebrex . Having some issues with sleep and hot flashes. Trial of gabapentin . See if can control some of her pain, maybe can decrease amount of celebrex  taking. Check metabolic panel. F/u with Dr Lonn.

## 2023-08-28 NOTE — Assessment & Plan Note (Signed)
 Trial of gabapentin  as outlined. Having difficulty sleeping. Titrate up. Hopefully can control pain as well. Follow.

## 2023-08-28 NOTE — Assessment & Plan Note (Signed)
 Continues on lisinopril /hydrochlorothiazide  and lasix . Has been taking both. Blood pressure as outlined. Follow pressures. Check metabolic panel.

## 2023-09-01 NOTE — Telephone Encounter (Signed)
 Copied from CRM #[id number]. Topic: Clinical - Lab/Test Results >> Sep 01, 2023 11:35 AM Franky GRADE wrote: Reason for CRM: Patient retuning a call she received from [place], advised patient of the message Dr.Scott left regarding lab results. She understood and had no questions.

## 2023-09-14 ENCOUNTER — Ambulatory Visit (INDEPENDENT_AMBULATORY_CARE_PROVIDER_SITE_OTHER): Admitting: *Deleted

## 2023-09-14 VITALS — Ht 61.0 in | Wt 220.0 lb

## 2023-09-14 DIAGNOSIS — Z1231 Encounter for screening mammogram for malignant neoplasm of breast: Secondary | ICD-10-CM

## 2023-09-14 DIAGNOSIS — Z0000 Encounter for general adult medical examination without abnormal findings: Secondary | ICD-10-CM

## 2023-09-14 NOTE — Progress Notes (Signed)
 Subjective:   Leslie Koch is a 68 y.o. who presents for a Medicare Wellness preventive visit.  As a reminder, Annual Wellness Visits don't include a physical exam, and some assessments may be limited, especially if this visit is performed virtually. We may recommend an in-person follow-up visit with your provider if needed.  Visit Complete: Virtual I connected with  Leslie Koch on 09/14/23 by a audio enabled telemedicine application and verified that I am speaking with the correct person using two identifiers.  Patient Location: Home  Provider Location: Home Office  I discussed the limitations of evaluation and management by telemedicine. The patient expressed understanding and agreed to proceed.  Vital Signs: Because this visit was a virtual/telehealth visit, some criteria may be missing or patient reported. Any vitals not documented were not able to be obtained and vitals that have been documented are patient reported.  VideoDeclined- This patient declined Librarian, academic. Therefore the visit was completed with audio only.  Persons Participating in Visit: Patient.  AWV Questionnaire: No: Patient Medicare AWV questionnaire was not completed prior to this visit.  Cardiac Risk Factors include: advanced age (>110men, >60 women);dyslipidemia;hypertension;obesity (BMI >30kg/m2)     Objective:    Today's Vitals   09/14/23 0929  Weight: 220 lb (99.8 kg)  Height: 5' 1 (1.549 m)   Body mass index is 41.57 kg/m.     09/14/2023    9:49 AM 05/24/2022    3:47 PM 11/24/2021   10:48 AM 06/22/2021    7:07 AM 02/06/2019   11:40 AM 02/04/2019    5:04 PM 10/27/2016    3:46 PM  Advanced Directives  Does Patient Have a Medical Advance Directive? No No No Yes No No Yes   Type of Theme park manager;Living will   Healthcare Power of [place];Living will  Would patient like information on creating a medical advance directive? No  - Patient declined  No - Patient declined  No - Patient declined No - Patient declined      Data saved with a previous flowsheet row definition    Current Medications (verified) Outpatient Encounter Medications as of 09/14/2023  Medication Sig   albuterol  (VENTOLIN  HFA) 108 (90 Base) MCG/ACT inhaler TAKE 2 PUFFS BY MOUTH EVERY 6 HOURS AS NEEDED FOR WHEEZE OR SHORTNESS OF BREATH   aspirin  EC 81 MG tablet Take 81 mg by mouth daily.    Calcium Carbonate-Vitamin D  (CALCIUM 500/D PO) Take 1 tablet by mouth daily.   celecoxib  (CELEBREX ) 200 MG capsule Take 1 capsule (200 mg total) by mouth daily.   cetirizine (ZYRTEC) 10 MG tablet Take 10 mg by mouth daily.    Cholecalciferol  (VITAMIN D3) 1000 units CAPS Take 1,000 Units by mouth in the morning and at bedtime.   dextromethorphan -guaiFENesin  (MUCINEX  DM) 30-600 MG 12hr tablet Take 1 tablet by mouth 4 (four) times daily. Take 2 in the morning and 2 nightly (Patient taking differently: Take 1 tablet by mouth 2 (two) times daily as needed. Take 2 in the morning and 2 nightly)   estradiol  (ESTRACE ) 0.1 MG/GM vaginal cream Place 1 Applicatorful vaginally 3 (three) times a week.   estradiol  (ESTRACE ) 1 MG tablet Take 2 tablets (2 mg total) by mouth daily.   fluticasone (FLONASE) 50 MCG/ACT nasal spray Place 2 sprays into both nostrils daily.   furosemide  (LASIX ) 40 MG tablet Take 1 tablet (40 mg total) by mouth 2 (two) times daily as needed. (Patient  taking differently: Take 40 mg by mouth daily.)   gabapentin  (NEURONTIN ) 100 MG capsule Take 1 capsule (100 mg total) by mouth at bedtime.   Glucosamine-Chondroitin 750-600 MG TABS Take 1 tablet by mouth in the morning and at bedtime.   lisinopril -hydrochlorothiazide  (ZESTORETIC ) 20-12.5 MG tablet Take 1 tablet by mouth daily.   Omega-3 1000 MG CAPS Take 2,000 mg by mouth daily.   oxymetazoline (AFRIN) 0.05 % nasal spray Place 1-2 sprays into both nostrils 2 (two) times daily as needed for congestion.    potassium chloride  (K-DUR) 10 MEQ tablet Take 1 tablet (10 mEq total) by mouth 2 (two) times daily as needed. (Patient taking differently: Take 10 mEq by mouth daily.)   simvastatin  (ZOCOR ) 40 MG tablet TAKE 1 TABLET BY MOUTH EVERYDAY AT BEDTIME   Turmeric (QC TUMERIC COMPLEX PO) Take 1,000 mg by mouth daily.   valACYclovir  (VALTREX ) 1000 MG tablet Take 1-2 tablets (1,000-2,000 mg total) by mouth every 12 (twelve) hours as needed.   [DISCONTINUED] amoxicillin -clavulanate (AUGMENTIN ) 875-125 MG tablet Take 1 tablet by mouth 2 (two) times daily. (Patient not taking: Reported on 09/14/2023)   No facility-administered encounter medications on file as of 09/14/2023.    Allergies (verified) Augmentin  [amoxicillin -pot clavulanate] and Montelukast sodium   History: Past Medical History:  Diagnosis Date   Allergy ?   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 Knee Replacement   KNEE SURGERY Right 07/12/2010   TOTAL KNEE ARTHROPLASTY Right 10/12/2012   TOTAL KNEE ARTHROPLASTY WITH REVISION COMPONENTS Right 02/11/2016   Procedure: RIGHT TOTAL KNEE ARTHROPLASTY REVISION;  Surgeon: Dempsey Moan, MD;  Location: [facility];  Service: Orthopedics;  Laterality: Right;   TUBAL LIGATION  01/11/1982   Family History  Problem Relation Age of Onset   Hypertension Mother    Diabetes Mother    Coronary artery disease Mother    Arthritis Mother    Stroke Mother    Vision loss Mother    Lung cancer Father    Cancer Father    Stroke  Maternal Grandmother    Coronary artery disease Maternal Grandfather    Breast cancer Paternal Grandmother    Cancer Paternal Grandmother    Hearing loss Son    Social History   Socioeconomic History   Marital status: Married    Spouse name: Not on file   Number of children: Not on file   Years of education: Not on file   Highest education level: Some college, no degree  Occupational History   Not on file  Tobacco Use   Smoking status: Former    Current packs/day: 0.00    Average packs/day: 2.0 packs/day for 30.0 years (60.0 ttl pk-yrs)    Types: Cigarettes    Start date: 04/13/1965    Quit date: 04/14/1995    Years since quitting: 28.4   Smokeless tobacco: Never   Tobacco comments:    quit 04/14/1995  Vaping Use   Vaping status: Never Used  Substance and Sexual Activity  Alcohol use: Not Currently    Comment: occasionally 2-3 drinks once or twice a month   Drug use: No   Sexual activity: Not Currently    Birth control/protection: Post-menopausal  Other Topics Concern   Not on file  Social History Narrative   married   Social Drivers of Corporate investment banker Strain: Low Risk  (09/14/2023)   Overall Financial Resource Strain (CARDIA)    Difficulty of Paying Living Expenses: Not hard at all  Food Insecurity: No Food Insecurity (09/14/2023)   Hunger Vital Sign    Worried About Running Out of Food in the Last Year: Never true    Ran Out of Food in the Last Year: Never true  Transportation Needs: No Transportation Needs (09/14/2023)   PRAPARE - Administrator, Civil Service (Medical): No    Lack of Transportation (Non-Medical): No  Physical Activity: Insufficiently Active (09/14/2023)   Exercise Vital Sign    Days of Exercise per Week: 1 day    Minutes of Exercise per Session: 60 min  Stress: No Stress Concern Present (09/14/2023)   [employer] of Occupational Health - Occupational Stress Questionnaire    Feeling of Stress: Only a little  Recent  Concern: Stress - Stress Concern Present (08/16/2023)   [employer] of Occupational Health - Occupational Stress Questionnaire    Feeling of Stress: Very much  Social Connections: Moderately Isolated (09/14/2023)   Social Connection and Isolation Panel    Frequency of Communication with Friends and Family: More than three times a week    Frequency of Social Gatherings with Friends and Family: More than three times a week    Attends Religious Services: Never    Database administrator or Organizations: No    Attends Banker Meetings: Never    Marital Status: Married    Tobacco Counseling Counseling given: Not Answered Tobacco comments: quit 04/14/1995    Clinical Intake:  Pre-visit preparation completed: Yes  Pain : No/denies pain     BMI - recorded: 41.57 Nutritional Status: BMI > 30  Obese Nutritional Risks: None Diabetes: No  Lab Results  Component Value Date   HGBA1C 5.6 05/11/2022   HGBA1C 5.5 03/03/2020   HGBA1C 5.7 (H) 02/06/2019     How often do you need to have someone help you when you read instructions, pamphlets, or other written materials from your doctor or pharmacy?: 1 - Never  Interpreter Needed?: No  Information entered by :: R. Diania Co LPN   Activities of Daily Living     09/14/2023    9:31 AM  In your present state of health, do you have any difficulty performing the following activities:  Hearing? 0  Vision? 0  Difficulty concentrating or making decisions? 0  Walking or climbing stairs? 1  Dressing or bathing? 0  Doing errands, shopping? 0  Preparing Food and eating ? N  Using the Toilet? N  In the past six months, have you accidently leaked urine? Y  Do you have problems with loss of bowel control? N  Managing your Medications? N  Managing your Finances? N  Housekeeping or managing your Housekeeping? N    Patient Care Team: Glendia Shad, MD as PCP - General (Internal Medicine) Perla Evalene PARAS, MD as Consulting  Physician (Cardiology)  I have updated your Care Teams any recent Medical Services you may have received from other providers in the past year.     Assessment:   This is a routine  wellness examination for Leslie Koch.  Hearing/Vision screen Hearing Screening - Comments:: No issues Vision Screening - Comments:: glasses   Goals Addressed             This Visit's Progress    Patient Stated       Wants to lose weight       Depression Screen     09/14/2023    9:41 AM 08/23/2023   11:14 AM 05/24/2022    3:43 PM 05/11/2022   11:06 AM 12/14/2021   10:29 AM 05/05/2021   10:39 AM 09/01/2020   10:13 AM  PHQ 2/9 Scores  PHQ - 2 Score 1 3 2 2 4  0 3  PHQ- 9 Score 11 14 5 7 9 3 12     Fall Risk     09/14/2023    9:34 AM 08/23/2023   11:12 AM 05/24/2022    3:39 PM 05/11/2022   11:06 AM 12/14/2021   10:28 AM  Fall Risk   Falls in the past year? 0 0 1 0 0  Number falls in past yr: 0 0 1 0 0  Injury with Fall? 0 0 0 0 0  Risk for fall due to : No Fall Risks No Fall Risks No Fall Risks No Fall Risks No Fall Risks  Follow up Falls evaluation completed;Falls prevention discussed Falls evaluation completed Education provided;Falls prevention discussed  Falls evaluation completed      Data saved with a previous flowsheet row definition    MEDICARE RISK AT HOME:  Medicare Risk at Home Any stairs in or around the home?: Yes If so, are there any without handrails?: No Home free of loose throw rugs in walkways, pet beds, electrical cords, etc?: Yes Adequate lighting in your home to reduce risk of falls?: Yes Life alert?: No Use of a cane, walker or w/c?: No Grab bars in the bathroom?: Yes Shower chair or bench in shower?: Yes Elevated toilet seat or a handicapped toilet?: Yes  TIMED UP AND GO:  Was the test performed?  No  Cognitive Function: 6CIT completed        09/14/2023    9:49 AM 05/24/2022    3:53 PM  6CIT Screen  What Year? 0 points 0 points  What month? 0 points 0 points  What  time? 0 points 0 points  Count back from 20 0 points 0 points  Months in reverse 0 points 0 points  Repeat phrase 0 points 0 points  Total Score 0 points 0 points    Immunizations Immunization History  Administered Date(s) Administered   Moderna Sars-Covid-2 Vaccination 01/22/2019, 05/12/2019   Pneumococcal Polysaccharide-23 05/05/2021   Td 09/01/2020   Tdap 02/24/2007    Screening Tests Health Maintenance  Topic Date Due   COVID-19 Vaccine (3 - Moderna risk series) 06/09/2019   Medicare Annual Wellness (AWV)  05/24/2023   Zoster Vaccines- Shingrix (1 of 2) 11/23/2023 (Originally [birth year]/1975)   INFLUENZA VACCINE  04/10/2024 (Originally 08/12/2023)   Pneumococcal Vaccine: 50+ Years (2 of 2 - PCV) 08/22/2024 (Originally 05/06/2022)   MAMMOGRAM  10/12/2024   Colonoscopy  06/24/2028   DTaP/Tdap/Td (3 - Td or Tdap) 09/02/2030   DEXA SCAN  Completed   Hepatitis C Screening  Completed   HPV VACCINES  Aged Out   Meningococcal B Vaccine  Aged Out    Health Maintenance  Health Maintenance Due  Topic Date Due   COVID-19 Vaccine (3 - Moderna risk series) 06/09/2019   Medicare Annual Wellness (AWV)  05/24/2023  Health Maintenance Items Addressed: Mammogram ordered Discussed the need to update shingles vaccines. Patient declines flu and covid vaccines.   Additional Screening:  Vision Screening: Recommended annual ophthalmology exams for early detection of glaucoma and other disorders of the eye. [facility]  Patient stated that she will call and schedule an appointment. Would you like a referral to an eye doctor? No    Dental Screening: Recommended annual dental exams for proper oral hygiene  Community Resource Referral / Chronic Care Management: CRR required this visit?  No   CCM required this visit?  No   Plan:    I have personally reviewed and noted the following in the patient's chart:   Medical and social history Use of alcohol, tobacco or illicit drugs   Current medications and supplements including opioid prescriptions. Patient is not currently taking opioid prescriptions. Functional ability and status Nutritional status Physical activity Advanced directives List of other physicians Hospitalizations, surgeries, and ER visits in previous 12 months Vitals Screenings to include cognitive, depression, and falls Referrals and appointments  In addition, I have reviewed and discussed with patient certain preventive protocols, quality metrics, and best practice recommendations. A written personalized care plan for preventive services as well as general preventive health recommendations were provided to patient.   Angeline Fredericks, LPN   [id number]   After Visit Summary: (MyChart) Due to this being a telephonic visit, the after visit summary with patients personalized plan was offered to patient via MyChart   Notes: Nothing significant to report at this time.

## 2023-09-14 NOTE — Patient Instructions (Signed)
 [name] , Thank you for taking time out of your busy schedule to complete your Annual Wellness Visit with me. I enjoyed our conversation and look forward to speaking with you again next year. I, as well as your care team,  appreciate your ongoing commitment to your health goals. Please review the following plan we discussed and let me know if I can assist you in the future. Your Game plan/ To Do List    Referrals: If you haven't heard from the office you've been referred to, please reach out to them at the phone provided.   An order has been placed for your mammogram.  Remember to get your shingles vaccines and call to schedule an eye exam. You have an order for:  []   2D Mammogram  [x]   3D Mammogram  []   Bone Density     Please call for appointment:  [facility]  [address] [place] [address] [phone]    Make sure to wear two-piece clothing.  No lotions, powders, or deodorants the day of the appointment. Make sure to bring picture ID and insurance card.  Bring list of medications you are currently taking including any supplements.    Follow up Visits: We will see or speak with you next year for your Next Medicare AWV with our clinical staff 09/18/24 @ 10:50 Have you seen your provider in the last 6 months (3 months if uncontrolled diabetes)? Yes  Clinician Recommendations:  Aim for 30 minutes of exercise or brisk walking, 6-8 glasses of water, and 5 servings of fruits and vegetables each day.       This is a list of the screenings recommended for you:  Health Maintenance  Topic Date Due   COVID-19 Vaccine (3 - Moderna risk series) 06/09/2019   Zoster (Shingles) Vaccine (1 of 2) 11/23/2023*   Flu Shot  04/10/2024*   Pneumococcal Vaccine for age over 69 (2 of 2 - PCV) 08/22/2024*   Medicare Annual Wellness Visit  09/13/2024   Mammogram  10/12/2024   Colon Cancer Screening  06/24/2028   DTaP/Tdap/Td vaccine (3 - Td  or Tdap) 09/02/2030   DEXA scan (bone density measurement)  Completed   Hepatitis C Screening  Completed   HPV Vaccine  Aged Out   Meningitis B Vaccine  Aged Out  *Topic was postponed. The date shown is not the original due date.    Advanced directives: (ACP Link)Information on Advanced Care Planning can be found at [place]  Secretary of [facility] Advance Health Care Directives Advance Health Care Directives. [email or web address]  Advance Care Planning is important because it:  [x]  Makes sure you receive the medical care that is consistent with your values, goals, and preferences  [x]  It provides guidance to your family and loved ones and reduces their decisional burden about whether or not they are making the right decisions based on your wishes.  Follow the link provided in your after visit summary or read over the paperwork we have mailed to you to help you started getting your Advance Directives in place. If you need assistance in completing these, please reach out to us  so that we can help you!

## 2023-09-16 DIAGNOSIS — M180 Bilateral primary osteoarthritis of first carpometacarpal joints: Secondary | ICD-10-CM | POA: Diagnosis not present

## 2023-09-16 DIAGNOSIS — G5603 Carpal tunnel syndrome, bilateral upper limbs: Secondary | ICD-10-CM | POA: Diagnosis not present

## 2023-09-17 ENCOUNTER — Telehealth: Payer: Self-pay | Admitting: Internal Medicine

## 2023-09-17 NOTE — Telephone Encounter (Signed)
 I received notification from [facility] that they started her on meloxicam. Please call and let her know that she cannot take the meloxicam with celebrex . She needs to stop celebrex  if on meloxicam.

## 2023-09-19 NOTE — Telephone Encounter (Signed)
 Patient is aware of below.

## 2023-10-15 ENCOUNTER — Other Ambulatory Visit: Payer: Self-pay | Admitting: Family Medicine

## 2023-10-15 DIAGNOSIS — E7800 Pure hypercholesterolemia, unspecified: Secondary | ICD-10-CM

## 2023-10-15 DIAGNOSIS — I10 Essential (primary) hypertension: Secondary | ICD-10-CM

## 2023-10-18 ENCOUNTER — Ambulatory Visit (INDEPENDENT_AMBULATORY_CARE_PROVIDER_SITE_OTHER): Admitting: Internal Medicine

## 2023-10-18 VITALS — BP 120/70 | HR 96 | Resp 16 | Ht 61.0 in | Wt 226.0 lb

## 2023-10-18 DIAGNOSIS — R600 Localized edema: Secondary | ICD-10-CM

## 2023-10-18 DIAGNOSIS — I10 Essential (primary) hypertension: Secondary | ICD-10-CM

## 2023-10-18 DIAGNOSIS — B001 Herpesviral vesicular dermatitis: Secondary | ICD-10-CM | POA: Diagnosis not present

## 2023-10-18 DIAGNOSIS — M19049 Primary osteoarthritis, unspecified hand: Secondary | ICD-10-CM

## 2023-10-18 DIAGNOSIS — E7800 Pure hypercholesterolemia, unspecified: Secondary | ICD-10-CM

## 2023-10-18 DIAGNOSIS — R1310 Dysphagia, unspecified: Secondary | ICD-10-CM | POA: Diagnosis not present

## 2023-10-18 DIAGNOSIS — Z1211 Encounter for screening for malignant neoplasm of colon: Secondary | ICD-10-CM

## 2023-10-18 DIAGNOSIS — R232 Flushing: Secondary | ICD-10-CM | POA: Diagnosis not present

## 2023-10-18 MED ORDER — FUROSEMIDE 40 MG PO TABS: 40.0000 mg | ORAL_TABLET | Freq: Every day | ORAL | 2 refills | Status: DC

## 2023-10-18 MED ORDER — ESTRADIOL 1 MG PO TABS: 2.0000 mg | ORAL_TABLET | Freq: Every day | ORAL | 1 refills | Status: DC

## 2023-10-18 MED ORDER — LISINOPRIL-HYDROCHLOROTHIAZIDE 20-12.5 MG PO TABS: 1.0000 | ORAL_TABLET | Freq: Every day | ORAL | 0 refills | Status: DC

## 2023-10-18 MED ORDER — SIMVASTATIN 40 MG PO TABS: ORAL_TABLET | ORAL | 0 refills | Status: DC

## 2023-10-18 NOTE — Progress Notes (Signed)
 Subjective:    Patient ID: Leslie Koch, female    DOB: 1955/12/17, 68 y.o.   MRN: [id number]  Patient here for  Chief Complaint  Patient presents with   Medical Management of Chronic Issues    HPI Here for a scheduled follow up - follow up regarding hypertension, GERD and hypercholesterolemia. Last visit, discussed concerns regarding hot flashes and sleep issues. Started on gabapentin . Off now. Did not tolerate.  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 Some loose body formation. Recommended thumb spica braces. Recommended bilateral NCS. Prescribed mobic. Did not take. Is on celebrex . Continuing to take celebrex . Planning to call about getting NCS scheduled. Breathing stable. No abdominal pain or bowel change reported. Does report increased gas. Also reports feeling occasionally at night - of something hanging up in her throat. Occasionally will notice with french fries. Taking omeprazole  twice a day before breakfast and before evening meal.    Past Medical History:  Diagnosis Date   Allergy ?   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 Knee Replacement   KNEE SURGERY Right 07/12/2010   TOTAL KNEE ARTHROPLASTY Right 10/12/2012   TOTAL KNEE ARTHROPLASTY WITH REVISION COMPONENTS Right 02/11/2016   Procedure: RIGHT TOTAL KNEE ARTHROPLASTY REVISION;  Surgeon: Dempsey Moan, MD;  Location: [facility];  Service: Orthopedics;   Laterality: Right;   TUBAL LIGATION  01/11/1982   Family History  Problem Relation Age of Onset   Hypertension Mother    Diabetes Mother    Coronary artery disease Mother    Arthritis Mother    Stroke Mother    Vision loss Mother    Lung cancer Father    Cancer Father    Stroke Maternal Grandmother    Coronary artery disease Maternal Grandfather    Breast cancer Paternal Grandmother    Cancer Paternal Grandmother    Hearing loss Son    Social History   Socioeconomic History   Marital status: Married    Spouse name: Not on file   Number of children: Not on file   Years of education: Not on file   Highest education level: Some college, no degree  Occupational History   Not on file  Tobacco Use   Smoking status: Former    Current packs/day: 0.00    Average packs/day: 2.0 packs/day for 30.0 years (60.0 ttl pk-yrs)    Types: Cigarettes    Start date: 04/13/1965    Quit date: 04/14/1995    Years since quitting: 28.5   Smokeless tobacco: Never   Tobacco comments:    quit 04/14/1995  Vaping Use   Vaping status: Never Used  Substance and Sexual Activity   Alcohol use: Not Currently    Comment: occasionally 2-3 drinks once or twice a month   Drug use: No   Sexual activity: Not Currently    Birth control/protection: Post-menopausal  Other Topics Concern   Not on file  Social History Narrative   married   Social Drivers of Corporate investment banker Strain: Low Risk  (09/14/2023)   Overall Financial Resource Strain (CARDIA)    Difficulty of Paying Living Expenses: Not hard at all  Food Insecurity: No Food Insecurity (09/14/2023)   Hunger Vital Sign    Worried About Running Out of Food in the Last Year: Never true    Ran Out of Food in the Last Year: Never true  Transportation Needs: No Transportation Needs (09/14/2023)   PRAPARE - Administrator, Civil Service (Medical): No    Lack of Transportation (Non-Medical): No  Physical Activity: Insufficiently Active  (09/14/2023)   Exercise Vital Sign    Days of Exercise per Week: 1 day    Minutes of Exercise per Session: 60 min  Stress: No Stress Concern Present (09/14/2023)   [employer] of Occupational Health - Occupational Stress Questionnaire    Feeling of Stress: Only a little  Recent Concern: Stress - Stress Concern Present (08/16/2023)   [employer] of Occupational Health - Occupational Stress Questionnaire    Feeling of Stress: Very much  Social Connections: Moderately Isolated (09/14/2023)   Social Connection and Isolation Panel    Frequency of Communication with Friends and Family: More than three times a week    Frequency of Social Gatherings with Friends and Family: More than three times a week    Attends Religious Services: Never    Database administrator or Organizations: No    Attends Banker Meetings: Never    Marital Status: Married     Review of Systems  Constitutional:  Negative for appetite change and unexpected weight change.  HENT:  Negative for congestion and sinus pressure.   Respiratory:  Negative for cough, chest tightness and shortness of breath.   Cardiovascular:  Negative for chest pain, palpitations and leg swelling.  Gastrointestinal:  Negative for abdominal pain, nausea and vomiting.       GI symptoms as outlined. Increased gas.   Genitourinary:  Negative for difficulty urinating and dysuria.  Musculoskeletal:  Negative for myalgias.       Thumb issues as outlined. Some hand numbness.   Skin:  Negative for color change and rash.  Neurological:  Negative for dizziness and headaches.  Psychiatric/Behavioral:         Increased stress. Discussed. Overall she feels she is handling things relatively well.        Objective:     BP 120/70   Pulse 96   Resp 16   Ht 5' 1 (1.549 m)   Wt 226 lb (102.5 kg)   SpO2 98%   BMI 42.70 kg/m  Wt Readings from Last 3 Encounters:  10/18/23 226 lb (102.5 kg)  09/14/23 220 lb (99.8 kg)  08/23/23 221 lb  6.4 oz (100.4 kg)    Physical Exam Vitals reviewed.  Constitutional:      General: She is not in acute distress.    Appearance: Normal appearance.  HENT:     Head: Normocephalic and atraumatic.     Right Ear: External ear normal.     Left Ear: External ear normal.     Mouth/Throat:     Pharynx: No oropharyngeal exudate or posterior oropharyngeal erythema.  Eyes:     General: No scleral icterus.       Right eye: No discharge.        Left eye: No discharge.     Conjunctiva/sclera: Conjunctivae normal.  Neck:     Thyroid: No thyromegaly.  Cardiovascular:     Rate and Rhythm: Normal rate and regular rhythm.  Pulmonary:     Effort: No respiratory distress.     Breath sounds: Normal breath sounds. No wheezing.  Abdominal:     General: Bowel sounds are normal.     Palpations: Abdomen is soft.     Tenderness: There is no abdominal tenderness.  Musculoskeletal:        General: No swelling or tenderness.     Cervical back: Neck supple. No tenderness.  Lymphadenopathy:     Cervical: No cervical adenopathy.  Skin:    Findings: No erythema or rash.  Neurological:     Mental Status: She is alert.  Psychiatric:        Mood and Affect: Mood normal.        Behavior: Behavior normal.         Outpatient Encounter Medications as of 10/18/2023  Medication Sig   albuterol  (VENTOLIN  HFA) 108 (90 Base) MCG/ACT inhaler TAKE 2 PUFFS BY MOUTH EVERY 6 HOURS AS NEEDED FOR WHEEZE OR SHORTNESS OF BREATH   aspirin  EC 81 MG tablet Take 81 mg by mouth daily.    Calcium Carbonate-Vitamin D  (CALCIUM 500/D PO) Take 1 tablet by mouth daily.   celecoxib  (CELEBREX ) 200 MG capsule Take 1 capsule (200 mg total) by mouth daily.   cetirizine (ZYRTEC) 10 MG tablet Take 10 mg by mouth daily.    Cholecalciferol  (VITAMIN D3) 1000 units CAPS Take 1,000 Units by mouth in the morning and at bedtime.   estradiol  (ESTRACE ) 0.1 MG/GM vaginal cream Place 1 Applicatorful vaginally 3 (three) times a week.    fluticasone (FLONASE) 50 MCG/ACT nasal spray Place 2 sprays into both nostrils daily.   Glucosamine-Chondroitin 750-600 MG TABS Take 1 tablet by mouth in the morning and at bedtime.   Omega-3 1000 MG CAPS Take 2,000 mg by mouth daily.   oxymetazoline (AFRIN) 0.05 % nasal spray Place 1-2 sprays into both nostrils 2 (two) times daily as needed for congestion.   potassium chloride  (K-DUR) 10 MEQ tablet Take 1 tablet (10 mEq total) by mouth 2 (two) times daily as needed. (Patient taking differently: Take 10 mEq by mouth daily.)   Turmeric (QC TUMERIC COMPLEX PO) Take 1,000 mg by mouth daily.   valACYclovir  (VALTREX ) 1000 MG tablet Take 1-2 tablets (1,000-2,000 mg total) by mouth every 12 (twelve) hours as needed.   estradiol  (ESTRACE ) 1 MG tablet Take 2 tablets (2 mg total) by mouth daily.   furosemide  (LASIX ) 40 MG tablet Take 1 tablet (40 mg total) by mouth daily.   lisinopril -hydrochlorothiazide  (ZESTORETIC ) 20-12.5 MG tablet Take 1 tablet by mouth daily.   simvastatin  (ZOCOR ) 40 MG tablet TAKE 1 TABLET BY MOUTH EVERYDAY AT BEDTIME   [DISCONTINUED] dextromethorphan -guaiFENesin  (MUCINEX  DM) 30-600 MG 12hr tablet Take 1 tablet by mouth 4 (four) times daily. Take 2 in the morning and 2 nightly (Patient taking differently: Take 1 tablet by mouth 2 (two) times daily as needed. Take 2 in the morning and 2 nightly)   [DISCONTINUED] estradiol  (ESTRACE ) 1 MG tablet Take 2 tablets (2 mg total) by mouth daily.   [DISCONTINUED] furosemide  (LASIX ) 40 MG tablet Take 1 tablet (40 mg total) by mouth 2 (two) times daily as needed. (Patient taking differently: Take 40 mg by mouth daily.)   [DISCONTINUED] gabapentin  (NEURONTIN ) 100 MG capsule Take 1 capsule (100 mg total) by mouth at bedtime.   [DISCONTINUED] lisinopril -hydrochlorothiazide  (ZESTORETIC )  20-12.5 MG tablet Take 1 tablet by mouth daily.   [DISCONTINUED] simvastatin  (ZOCOR ) 40 MG tablet TAKE 1 TABLET BY MOUTH EVERYDAY AT BEDTIME   No facility-administered  encounter medications on file as of 10/18/2023.     Lab Results  Component Value Date   WBC 7.7 08/23/2023   HGB 13.5 08/23/2023   HCT 40.6 08/23/2023   PLT 293.0 08/23/2023   GLUCOSE 93 08/23/2023   CHOL 181 08/23/2023   TRIG 168.0 (H) 08/23/2023   HDL 63.20 08/23/2023   LDLCALC 85 08/23/2023   ALT 9 08/23/2023   AST 12 08/23/2023   NA 140 08/23/2023   K 4.0 08/23/2023   CL 99 08/23/2023   CREATININE 0.70 08/23/2023   BUN 14 08/23/2023   CO2 32 08/23/2023   TSH 2.60 08/23/2023   INR 0.99 02/04/2016   HGBA1C 5.6 05/11/2022    MM 3D SCREENING MAMMOGRAM BILATERAL BREAST Result Date: 10/15/2022 CLINICAL DATA:  Screening. EXAM: DIGITAL SCREENING BILATERAL MAMMOGRAM WITH TOMOSYNTHESIS AND CAD TECHNIQUE: Bilateral screening digital craniocaudal and mediolateral oblique mammograms were obtained. Bilateral screening digital breast tomosynthesis was performed. The images were evaluated with computer-aided detection. COMPARISON:  Previous exam(s). ACR Breast Density Category b: There are scattered areas of fibroglandular density. FINDINGS: There are no findings suspicious for malignancy. IMPRESSION: No mammographic evidence of malignancy. A result letter of this screening mammogram will be mailed directly to the patient. RECOMMENDATION: Screening mammogram in one year. (Code:SM-B-01Y) BI-RADS CATEGORY  1: Negative. Electronically Signed   By: Toribio Agreste M.D.   On: 10/15/2022 09:09       Assessment & Plan:  Screening for colon cancer Assessment & Plan: Colonoscopy 6/2-23 - 2 3-58mm polyps, diverticulosis, non bleeding external hemorrhoids. Recommended f/u in 7 years.    Hypercholesterolemia Assessment & Plan: Continues on simvastatin . Low cholesterol diet and exercise. Follow lipid panel.   Orders: -     Hepatic function panel; Future -     Lipid panel; Future -     Basic metabolic panel with GFR; Future -     Simvastatin ; TAKE 1 TABLET BY MOUTH EVERYDAY AT BEDTIME  Dispense: 90  tablet; Refill: 0  Lower extremity edema -     Furosemide ; Take 1 tablet (40 mg total) by mouth daily.  Dispense: 30 tablet; Refill: 2  Primary hypertension Assessment & Plan: Continues on lisinopril /hydrochlorothiazide  and lasix . Has been taking both. Blood pressure as outlined. Follow pressures. Follow metabolic panel.   Orders: -     Lisinopril -hydroCHLOROthiazide ; Take 1 tablet by mouth daily.  Dispense: 90 tablet; Refill: 0  Cold sore  Hot flashes Assessment & Plan: Last visit discussed trial of gabapentin . Did not tolerate. Discussed. Will monitor. Notify if feels needs further intervention.    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 Some loose body formation. Recommended thumb spica braces. Recommended bilateral NCS. Prescribed mobic. Did not take. Is on celebrex . Continuing to take celebrex . Planning to call about getting NCS scheduled.    Dysphagia, unspecified type Assessment & Plan: Does report increased gas. Also reports feeling occasionally at night - of something hanging up in her throat. Occasionally will notice with french fries. Taking omeprazole  twice a day before breakfast and before evening meal. No abdominal pain.  Discussed further w/up and evaluation, especially given on prilosec bid. Wants to monitor. Small bites. Chew food well. Monitor for triggers. Avoid eating late. Follow.  Notify if persistent.    Other orders -     Estradiol ; Take 2 tablets (2 mg total) by mouth daily.  Dispense: 180 tablet; Refill: 1     Allena Hamilton, MD

## 2023-10-23 ENCOUNTER — Encounter: Payer: Self-pay | Admitting: Internal Medicine

## 2023-10-23 DIAGNOSIS — R1310 Dysphagia, unspecified: Secondary | ICD-10-CM | POA: Insufficient documentation

## 2023-10-23 DIAGNOSIS — M19049 Primary osteoarthritis, unspecified hand: Secondary | ICD-10-CM | POA: Insufficient documentation

## 2023-10-23 NOTE — Assessment & Plan Note (Signed)
 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 Some loose body formation. Recommended thumb spica braces. Recommended bilateral NCS. Prescribed mobic. Did not take. Is on celebrex . Continuing to take celebrex . Planning to call about getting NCS scheduled.

## 2023-10-23 NOTE — Assessment & Plan Note (Signed)
 Does report increased gas. Also reports feeling occasionally at night - of something hanging up in her throat. Occasionally will notice with french fries. Taking omeprazole  twice a day before breakfast and before evening meal. No abdominal pain.  Discussed further w/up and evaluation, especially given on prilosec bid. Wants to monitor. Small bites. Chew food well. Monitor for triggers. Avoid eating late. Follow. Notify if persistent.

## 2023-10-23 NOTE — Assessment & Plan Note (Signed)
 Colonoscopy 6/2-23 - 2 3-77mm polyps, diverticulosis, non bleeding external hemorrhoids. Recommended f/u in 7 years.

## 2023-10-23 NOTE — Assessment & Plan Note (Signed)
 Continues on lisinopril /hydrochlorothiazide  and lasix . Has been taking both. Blood pressure as outlined. Follow pressures. Follow metabolic panel.

## 2023-10-23 NOTE — Assessment & Plan Note (Signed)
 Continues on simvastatin. Low cholesterol diet and exercise. Follow lipid panel.

## 2023-10-23 NOTE — Assessment & Plan Note (Signed)
 Last visit discussed trial of gabapentin . Did not tolerate. Discussed. Will monitor. Notify if feels needs further intervention.

## 2023-11-01 ENCOUNTER — Ambulatory Visit: Admitting: Internal Medicine

## 2024-01-11 ENCOUNTER — Other Ambulatory Visit: Payer: Self-pay | Admitting: Internal Medicine

## 2024-01-11 DIAGNOSIS — I10 Essential (primary) hypertension: Secondary | ICD-10-CM

## 2024-01-11 DIAGNOSIS — E7800 Pure hypercholesterolemia, unspecified: Secondary | ICD-10-CM

## 2024-01-17 ENCOUNTER — Other Ambulatory Visit: Payer: Self-pay | Admitting: Internal Medicine

## 2024-01-17 DIAGNOSIS — R600 Localized edema: Secondary | ICD-10-CM

## 2024-01-27 ENCOUNTER — Other Ambulatory Visit (INDEPENDENT_AMBULATORY_CARE_PROVIDER_SITE_OTHER)

## 2024-01-27 DIAGNOSIS — E7800 Pure hypercholesterolemia, unspecified: Secondary | ICD-10-CM

## 2024-01-27 LAB — HEPATIC FUNCTION PANEL
ALT: 8 U/L (ref 3–35)
AST: 12 U/L (ref 5–37)
Albumin: 3.5 g/dL (ref 3.5–5.2)
Alkaline Phosphatase: 44 U/L (ref 39–117)
Bilirubin, Direct: 0.1 mg/dL (ref 0.1–0.3)
Total Bilirubin: 0.4 mg/dL (ref 0.2–1.2)
Total Protein: 6.2 g/dL (ref 6.0–8.3)

## 2024-01-27 LAB — LIPID PANEL
Cholesterol: 158 mg/dL (ref 28–200)
HDL: 59.6 mg/dL
LDL Cholesterol: 69 mg/dL (ref 10–99)
NonHDL: 98.45
Total CHOL/HDL Ratio: 3
Triglycerides: 148 mg/dL (ref 10.0–149.0)
VLDL: 29.6 mg/dL (ref 0.0–40.0)

## 2024-01-27 LAB — BASIC METABOLIC PANEL WITH GFR
BUN: 16 mg/dL (ref 6–23)
CO2: 30 meq/L (ref 19–32)
Calcium: 8.6 mg/dL (ref 8.4–10.5)
Chloride: 103 meq/L (ref 96–112)
Creatinine, Ser: 0.71 mg/dL (ref 0.40–1.20)
GFR: 87.77 mL/min
Glucose, Bld: 103 mg/dL — ABNORMAL HIGH (ref 70–99)
Potassium: 4.3 meq/L (ref 3.5–5.1)
Sodium: 138 meq/L (ref 135–145)

## 2024-01-28 ENCOUNTER — Ambulatory Visit: Payer: Self-pay | Admitting: Internal Medicine

## 2024-01-31 ENCOUNTER — Ambulatory Visit: Admitting: Internal Medicine

## 2024-01-31 ENCOUNTER — Encounter: Payer: Self-pay | Admitting: Internal Medicine

## 2024-01-31 VITALS — BP 116/70 | HR 77 | Temp 98.5°F | Ht 61.0 in | Wt 224.4 lb

## 2024-01-31 DIAGNOSIS — E7800 Pure hypercholesterolemia, unspecified: Secondary | ICD-10-CM | POA: Diagnosis not present

## 2024-01-31 DIAGNOSIS — M19049 Primary osteoarthritis, unspecified hand: Secondary | ICD-10-CM

## 2024-01-31 DIAGNOSIS — Z6841 Body Mass Index (BMI) 40.0 and over, adult: Secondary | ICD-10-CM

## 2024-01-31 DIAGNOSIS — Z1231 Encounter for screening mammogram for malignant neoplasm of breast: Secondary | ICD-10-CM

## 2024-01-31 DIAGNOSIS — Z0000 Encounter for general adult medical examination without abnormal findings: Secondary | ICD-10-CM | POA: Diagnosis not present

## 2024-01-31 DIAGNOSIS — R0989 Other specified symptoms and signs involving the circulatory and respiratory systems: Secondary | ICD-10-CM

## 2024-01-31 DIAGNOSIS — I10 Essential (primary) hypertension: Secondary | ICD-10-CM | POA: Diagnosis not present

## 2024-01-31 DIAGNOSIS — Z1211 Encounter for screening for malignant neoplasm of colon: Secondary | ICD-10-CM

## 2024-01-31 MED ORDER — CELECOXIB 200 MG PO CAPS: 200.0000 mg | ORAL_CAPSULE | Freq: Every day | ORAL | 0 refills | Status: AC

## 2024-01-31 MED ORDER — LISINOPRIL-HYDROCHLOROTHIAZIDE 20-12.5 MG PO TABS: 1.0000 | ORAL_TABLET | Freq: Every day | ORAL | 1 refills | Status: AC

## 2024-01-31 MED ORDER — ESTRADIOL 1 MG PO TABS: 2.0000 mg | ORAL_TABLET | Freq: Every day | ORAL | 0 refills | Status: AC

## 2024-01-31 MED ORDER — SIMVASTATIN 40 MG PO TABS: ORAL_TABLET | ORAL | 1 refills | Status: AC

## 2024-01-31 MED ORDER — DOXYCYCLINE HYCLATE 100 MG PO TABS: 100.0000 mg | ORAL_TABLET | Freq: Two times a day (BID) | ORAL | 0 refills | Status: AC

## 2024-01-31 MED ORDER — PREDNISONE 10 MG PO TABS: ORAL_TABLET | ORAL | 0 refills | Status: AC

## 2024-01-31 MED ORDER — BENZONATATE 100 MG PO CAPS: 100.0000 mg | ORAL_CAPSULE | Freq: Three times a day (TID) | ORAL | 0 refills | Status: AC | PRN

## 2024-01-31 NOTE — Assessment & Plan Note (Addendum)
 Physical today 01/31/24. Mammogram 10/15/22 - Birads I. Overdue. Scheduled. S/p hysterectomy. Colonoscopy 06/22/21 - recommended f/u in 7 years. Normal bone density 08/2021.

## 2024-01-31 NOTE — Progress Notes (Signed)
 "  Subjective:    Patient ID: Leslie Koch, female    DOB: [birth year], 69 y.o.   MRN: [id number]  Patient here for  Chief Complaint  Patient presents with   Medical Management of Chronic Issues   Annual Exam   Cough    HPI Here for a physical exam. 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 Some loose body formation. Recommended thumb spica braces. Recommended bilateral NCS. Last visit, reported some issues with dysphagia. On prilosec. Reports that over the last two weeks, she has noticed increased cough and congestion - chest congestion. Scratchy throat. Thick yellow/green mucus production. Previous nasal congestion. Some soft stool. No increased sob.    Past Medical History:  Diagnosis Date   Allergy ?   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 Knee Replacement   KNEE SURGERY Right 07/12/2010   TOTAL KNEE ARTHROPLASTY Right 10/12/2012   TOTAL KNEE ARTHROPLASTY WITH REVISION COMPONENTS Right 02/11/2016   Procedure: RIGHT TOTAL KNEE ARTHROPLASTY REVISION;  Surgeon: Dempsey Moan, MD;  Location: [facility];  Service: Orthopedics;  Laterality: Right;   TUBAL LIGATION  01/11/1982   Family History  Problem Relation Age of Onset   Hypertension Mother    Diabetes Mother    Coronary artery disease Mother    Arthritis Mother    Stroke Mother    Vision loss Mother    Lung cancer Father    Cancer Father    Stroke Maternal  Grandmother    Coronary artery disease Maternal Grandfather    Breast cancer Paternal Grandmother    Cancer Paternal Grandmother    Hearing loss Son    Social History   Socioeconomic History   Marital status: Married    Spouse name: Not on file   Number of children: Not on file   Years of education: Not on file   Highest education level: Some college, no degree  Occupational History   Not on file  Tobacco Use   Smoking status: Former    Current packs/day: 0.00    Average packs/day: 2.0 packs/day for 30.0 years (60.0 ttl pk-yrs)    Types: Cigarettes    Start date: 04/13/1965    Quit date: 04/14/1995    Years since quitting: 28.8   Smokeless tobacco: Never   Tobacco comments:    quit 04/14/1995  Vaping Use   Vaping status: Never Used  Substance and Sexual Activity   Alcohol use: Not Currently    Comment: occasionally 2-3 drinks once or twice a month   Drug use: No   Sexual activity: Not Currently    Birth control/protection: Post-menopausal  Other Topics Concern   Not on file  Social History Narrative   married   Social Drivers of Health   Tobacco Use: Medium Risk (02/05/2024)   Patient History    Smoking Tobacco Use: Former    Smokeless Tobacco Use: Never    Passive  Exposure: Not on file  Financial Resource Strain: Low Risk (09/14/2023)   Overall Financial Resource Strain (CARDIA)    Difficulty of Paying Living Expenses: Not hard at all  Food Insecurity: No Food Insecurity (09/14/2023)   Epic    Worried About Programme Researcher, Broadcasting/film/video in the Last Year: Never true    Ran Out of Food in the Last Year: Never true  Transportation Needs: No Transportation Needs (09/14/2023)   Epic    Lack of Transportation (Medical): No    Lack of Transportation (Non-Medical): No  Physical Activity: Insufficiently Active (09/14/2023)   Exercise Vital Sign    Days of Exercise per Week: 1 day    Minutes of Exercise per Session: 60 min  Stress: No Stress Concern Present (09/14/2023)   [employer] - Occupational Stress Questionnaire    Feeling of Stress: Only a little  Recent Concern: Stress - Stress Concern Present (08/16/2023)   [employer] of Occupational Health - Occupational Stress Questionnaire    Feeling of Stress: Very much  Social Connections: Moderately Isolated (09/14/2023)   Social Connection and Isolation Panel    Frequency of Communication with Friends and Family: More than three times a week    Frequency of Social Gatherings with Friends and Family: More than three times a week    Attends Religious Services: Never    Database Administrator or Organizations: No    Attends Banker Meetings: Never    Marital Status: Married  Depression (PHQ2-9): Medium Risk (01/31/2024)   Depression (PHQ2-9)    PHQ-2 Score: 10  Alcohol Screen: Low Risk (09/14/2023)   Alcohol Screen    Last Alcohol Screening Score (AUDIT): 0  Housing: Unknown (09/14/2023)   Epic    Unable to Pay for Housing in the Last Year: No    Number of Times Moved in the Last Year: Not on file    Homeless in the Last Year: No  Utilities: Not At Risk (09/14/2023)   Epic    Threatened with loss of utilities: No  Health Literacy: Adequate Health Literacy (09/14/2023)   B1300 Health Literacy    Frequency of need for help with medical instructions: Never     Review of Systems  Constitutional:  Negative for appetite change, fever and unexpected weight change.  HENT:  Positive for congestion. Negative for sinus pressure and sore throat.        Scratchy throat.   Eyes:  Negative for pain and visual disturbance.  Respiratory:  Positive for cough. Negative for chest tightness and shortness of breath.        Chest congestion.   Cardiovascular:  Negative for chest pain, palpitations and leg swelling.  Gastrointestinal:  Negative for abdominal pain, diarrhea, nausea and vomiting.  Genitourinary:  Negative for difficulty urinating and dysuria.  Musculoskeletal:  Negative for  myalgias.       Left thumb arthritis as outlined.   Skin:  Negative for color change and rash.  Neurological:  Negative for dizziness and headaches.  Hematological:  Negative for adenopathy. Does not bruise/bleed easily.  Psychiatric/Behavioral:  Negative for agitation and dysphoric mood.        Objective:     BP 116/70   Pulse 77   Temp 98.5 F (36.9 C) (Oral)   Ht 5' 1 (1.549 m)   Wt 224 lb 6.4 oz (101.8 kg)   SpO2 97%   BMI 42.40 kg/m  Wt Readings from Last 3 Encounters:  01/31/24  224 lb 6.4 oz (101.8 kg)  10/18/23 226 lb (102.5 kg)  09/14/23 220 lb (99.8 kg)    Physical Exam Vitals reviewed.  Constitutional:      General: She is not in acute distress.    Appearance: Normal appearance. She is well-developed.  HENT:     Head: Normocephalic and atraumatic.     Right Ear: External ear normal.     Left Ear: External ear normal.  Eyes:     General: No scleral icterus.       Right eye: No discharge.        Left eye: No discharge.     Conjunctiva/sclera: Conjunctivae normal.  Neck:     Thyroid: No thyromegaly.  Cardiovascular:     Rate and Rhythm: Normal rate and regular rhythm.  Pulmonary:     Effort: No tachypnea, accessory muscle usage or respiratory distress.     Breath sounds: No decreased breath sounds.     Comments: Increased cough with forced expiration Chest:  Breasts:    Right: No inverted nipple, mass, nipple discharge or tenderness (no axillary adenopathy).     Left: No inverted nipple, mass, nipple discharge or tenderness (no axilarry adenopathy).  Abdominal:     General: Bowel sounds are normal.     Palpations: Abdomen is soft.     Tenderness: There is no abdominal tenderness.  Musculoskeletal:        General: No swelling or tenderness.     Cervical back: Neck supple.  Lymphadenopathy:     Cervical: No cervical adenopathy.  Skin:    Findings: No erythema or rash.  Neurological:     Mental Status: She is alert and oriented to person, place,  and time.  Psychiatric:        Mood and Affect: Mood normal.        Behavior: Behavior normal.         Outpatient Encounter Medications as of 01/31/2024  Medication Sig   albuterol  (VENTOLIN  HFA) 108 (90 Base) MCG/ACT inhaler TAKE 2 PUFFS BY MOUTH EVERY 6 HOURS AS NEEDED FOR WHEEZE OR SHORTNESS OF BREATH   aspirin  EC 81 MG tablet Take 81 mg by mouth daily.    benzonatate  (TESSALON ) 100 MG capsule Take 1 capsule (100 mg total) by mouth 3 (three) times daily as needed for cough.   Calcium Carbonate-Vitamin D  (CALCIUM 500/D PO) Take 1 tablet by mouth daily.   cetirizine (ZYRTEC) 10 MG tablet Take 10 mg by mouth daily.    Cholecalciferol  (VITAMIN D3) 1000 units CAPS Take 1,000 Units by mouth in the morning and at bedtime.   doxycycline  (VIBRA -TABS) 100 MG tablet Take 1 tablet (100 mg total) by mouth 2 (two) times daily.   estradiol  (ESTRACE ) 0.1 MG/GM vaginal cream Place 1 Applicatorful vaginally 3 (three) times a week.   fluticasone (FLONASE) 50 MCG/ACT nasal spray Place 2 sprays into both nostrils daily.   furosemide  (LASIX ) 40 MG tablet TAKE 1 TABLET BY MOUTH EVERY DAY   Glucosamine-Chondroitin 750-600 MG TABS Take 1 tablet by mouth in the morning and at bedtime.   Omega-3 1000 MG CAPS Take 2,000 mg by mouth daily.   oxymetazoline (AFRIN) 0.05 % nasal spray Place 1-2 sprays into both nostrils 2 (two) times daily as needed for congestion.   potassium chloride  (K-DUR) 10 MEQ tablet Take 1 tablet (10 mEq total) by mouth 2 (two) times daily as needed. (Patient taking differently: Take 10 mEq by mouth daily.)   predniSONE  (DELTASONE ) 10 MG  tablet Take 4 tablets x 1 day and then decrease by 1/2 tablet per day until down to zero mg.   Turmeric (QC TUMERIC COMPLEX PO) Take 1,000 mg by mouth daily.   valACYclovir  (VALTREX ) 1000 MG tablet Take 1-2 tablets (1,000-2,000 mg total) by mouth every 12 (twelve) hours as needed.   celecoxib  (CELEBREX ) 200 MG capsule Take 1 capsule (200 mg total) by mouth  daily.   estradiol  (ESTRACE ) 1 MG tablet Take 2 tablets (2 mg total) by mouth daily.   lisinopril -hydrochlorothiazide  (ZESTORETIC ) 20-12.5 MG tablet Take 1 tablet by mouth daily.   simvastatin  (ZOCOR ) 40 MG tablet TAKE 1 TABLET BY MOUTH EVERYDAY AT BEDTIME   [DISCONTINUED] celecoxib  (CELEBREX ) 200 MG capsule Take 1 capsule (200 mg total) by mouth daily.   [DISCONTINUED] estradiol  (ESTRACE ) 1 MG tablet Take 2 tablets (2 mg total) by mouth daily.   [DISCONTINUED] lisinopril -hydrochlorothiazide  (ZESTORETIC ) 20-12.5 MG tablet TAKE 1 TABLET BY MOUTH EVERY DAY   [DISCONTINUED] simvastatin  (ZOCOR ) 40 MG tablet TAKE 1 TABLET BY MOUTH EVERYDAY AT BEDTIME   No facility-administered encounter medications on file as of 01/31/2024.     Lab Results  Component Value Date   WBC 7.7 08/23/2023   HGB 13.5 08/23/2023   HCT 40.6 08/23/2023   PLT 293.0 08/23/2023   GLUCOSE 103 (H) 01/27/2024   CHOL 158 01/27/2024   TRIG 148.0 01/27/2024   HDL 59.60 01/27/2024   LDLCALC 69 01/27/2024   ALT 8 01/27/2024   AST 12 01/27/2024   NA 138 01/27/2024   K 4.3 01/27/2024   CL 103 01/27/2024   CREATININE 0.71 01/27/2024   BUN 16 01/27/2024   CO2 30 01/27/2024   TSH 2.60 08/23/2023   INR 0.99 02/04/2016   HGBA1C 5.6 05/11/2022    MM 3D SCREENING MAMMOGRAM BILATERAL BREAST Result Date: 10/15/2022 CLINICAL DATA:  Screening. EXAM: DIGITAL SCREENING BILATERAL MAMMOGRAM WITH TOMOSYNTHESIS AND CAD TECHNIQUE: Bilateral screening digital craniocaudal and mediolateral oblique mammograms were obtained. Bilateral screening digital breast tomosynthesis was performed. The images were evaluated with computer-aided detection. COMPARISON:  Previous exam(s). ACR Breast Density Category b: There are scattered areas of fibroglandular density. FINDINGS: There are no findings suspicious for malignancy. IMPRESSION: No mammographic evidence of malignancy. A result letter of this screening mammogram will be mailed directly to the patient.  RECOMMENDATION: Screening mammogram in one year. (Code:SM-B-01Y) BI-RADS CATEGORY  1: Negative. Electronically Signed   By: Toribio Agreste M.D.   On: 10/15/2022 09:09       Assessment & Plan:  Health care maintenance Assessment & Plan: Physical today 01/31/24. Mammogram 10/15/22 - Birads I. Overdue. Scheduled. S/p hysterectomy. Colonoscopy 06/22/21 - recommended f/u in 7 years. Normal bone density 08/2021.    Primary hypertension Assessment & Plan: Continues on lisinopril /hydrochlorothiazide  and lasix . Has been taking both. Blood pressure as outlined. Follow pressures. Follow metabolic panel.   Orders: -     Lisinopril -hydroCHLOROthiazide ; Take 1 tablet by mouth daily.  Dispense: 90 tablet; Refill: 1  Hypercholesterolemia Assessment & Plan: Continues on simvastatin . Low cholesterol diet and exercise. Follow lipid panel.   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 Some loose body formation. Recommended thumb spica braces. Recommended bilateral NCS. Prescribed mobic. Did not take. Is on celebrex . Continuing to take celebrex .    Morbid obesity (HCC) Assessment & Plan: Continue diet and exercise.    Screening for colon cancer Assessment & Plan: Colonoscopy 6/2-23 - 2 3-15mm polyps, diverticulosis, non bleeding external hemorrhoids. Recommended f/u in 7 years.    Chest congestion Assessment & Plan: Increased cough and congestion as outlined. Persistent symptoms. Continue mucinex  as directed. Albuterol  inhaler as directed. Tessalon  perles  100mg  tid prn cough. Doxycline 100mg  bid. Prednisone  taper as directed. Follow closely. Call with update.    Other orders -     Celecoxib ; Take 1 capsule (200 mg total) by mouth daily.  Dispense: 90 capsule; Refill: 0 -     Estradiol ; Take 2 tablets (2 mg total) by mouth daily.  Dispense: 180 tablet; Refill: 0 -     Doxycycline  Hyclate; Take 1 tablet (100 mg total) by mouth 2 (two) times daily.  Dispense: 14 tablet; Refill: 0 -     predniSONE ; Take 4 tablets x 1 day and then decrease by 1/2 tablet per day until down to zero mg.  Dispense: 18 tablet; Refill: 0 -     Benzonatate ; Take 1 capsule (100 mg total) by mouth 3 (three) times daily as needed for cough.  Dispense: 21 capsule; Refill: 0     Allena Hamilton, MD "

## 2024-02-05 ENCOUNTER — Encounter: Payer: Self-pay | Admitting: Internal Medicine

## 2024-02-05 DIAGNOSIS — R0989 Other specified symptoms and signs involving the circulatory and respiratory systems: Secondary | ICD-10-CM | POA: Insufficient documentation

## 2024-02-05 NOTE — Assessment & Plan Note (Signed)
 Increased cough and congestion as outlined. Persistent symptoms. Continue mucinex  as directed. Albuterol  inhaler as directed. Tessalon  perles 100mg  tid prn cough. Doxycline 100mg  bid. Prednisone  taper as directed. Follow closely. Call with update.

## 2024-02-05 NOTE — Assessment & Plan Note (Signed)
 Continues on simvastatin. Low cholesterol diet and exercise. Follow lipid panel.

## 2024-02-05 NOTE — Assessment & Plan Note (Signed)
 Colonoscopy 6/2-23 - 2 3-77mm polyps, diverticulosis, non bleeding external hemorrhoids. Recommended f/u in 7 years.

## 2024-02-05 NOTE — Assessment & Plan Note (Signed)
 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 Some loose body formation. Recommended thumb spica braces. Recommended bilateral NCS. Prescribed mobic. Did not take. Is on celebrex . Continuing to take celebrex .

## 2024-02-05 NOTE — Assessment & Plan Note (Signed)
 Continues on lisinopril /hydrochlorothiazide  and lasix . Has been taking both. Blood pressure as outlined. Follow pressures. Follow metabolic panel.

## 2024-02-05 NOTE — Assessment & Plan Note (Signed)
 Continue diet and exercise.

## 2024-02-16 ENCOUNTER — Other Ambulatory Visit: Payer: Self-pay | Admitting: Internal Medicine

## 2024-03-13 ENCOUNTER — Ambulatory Visit: Admitting: Dermatology

## 2024-06-01 ENCOUNTER — Other Ambulatory Visit

## 2024-06-05 ENCOUNTER — Ambulatory Visit: Admitting: Internal Medicine

## 2024-09-18 ENCOUNTER — Ambulatory Visit
